# Patient Record
Sex: Female | Born: 1955
Health system: Southern US, Community
[De-identification: ages and names within clinical notes are randomized; demographics above are authoritative.]

## PROBLEM LIST (undated history)

## (undated) DIAGNOSIS — E119 Type 2 diabetes mellitus without complications: Secondary | ICD-10-CM

## (undated) DIAGNOSIS — I480 Paroxysmal atrial fibrillation: Secondary | ICD-10-CM

## (undated) DIAGNOSIS — I509 Heart failure, unspecified: Secondary | ICD-10-CM

## (undated) DIAGNOSIS — I3139 Other pericardial effusion (noninflammatory): Secondary | ICD-10-CM

## (undated) DIAGNOSIS — J189 Pneumonia, unspecified organism: Secondary | ICD-10-CM

## (undated) DIAGNOSIS — E785 Hyperlipidemia, unspecified: Secondary | ICD-10-CM

## (undated) DIAGNOSIS — I5030 Unspecified diastolic (congestive) heart failure: Secondary | ICD-10-CM

## (undated) DIAGNOSIS — N189 Chronic kidney disease, unspecified: Secondary | ICD-10-CM

## (undated) DIAGNOSIS — I10 Essential (primary) hypertension: Secondary | ICD-10-CM

## (undated) HISTORY — DX: Essential (primary) hypertension: I10

## (undated) HISTORY — DX: Paroxysmal atrial fibrillation: I48.0

## (undated) HISTORY — DX: Type 2 diabetes mellitus without complications: E11.9

## (undated) HISTORY — PX: HERNIA REPAIR: SHX51

## (undated) HISTORY — DX: Unspecified diastolic (congestive) heart failure: I50.30

## (undated) HISTORY — DX: Hyperlipidemia, unspecified: E78.5

## (undated) HISTORY — DX: Other pericardial effusion (noninflammatory): I31.39

---
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
## 2017-12-05 LAB — GLUCOSE, POCT (MANUAL RESULT ENTRY)
POC Glucose: 101 mg/dl — AB (ref 70–99)
POC Glucose: 77 mg/dl (ref 70–99)

## 2018-08-21 LAB — GLUCOSE, POCT (MANUAL RESULT ENTRY)
POC Glucose: 78 mg/dl (ref 70–99)
POC Glucose: 86 mg/dl (ref 70–99)

## 2020-04-01 ENCOUNTER — Other Ambulatory Visit (HOSPITAL_COMMUNITY)
Admission: RE | Admit: 2020-04-01 | Discharge: 2020-04-01 | Disposition: A | Discharge status: Home / Self Care | Payer: Self-pay | Source: Ambulatory Visit | Attending: Physician Assistant | Admitting: Physician Assistant

## 2020-04-01 ENCOUNTER — Other Ambulatory Visit: Payer: Self-pay

## 2020-04-01 ENCOUNTER — Encounter: Payer: Self-pay | Admitting: Physician Assistant

## 2020-04-01 ENCOUNTER — Ambulatory Visit: Payer: Self-pay | Admitting: Physician Assistant

## 2020-04-01 VITALS — BP 190/100 | HR 69 | Temp 97.5°F | Ht 62.0 in | Wt 227.0 lb

## 2020-04-01 DIAGNOSIS — I10 Essential (primary) hypertension: Secondary | ICD-10-CM | POA: Insufficient documentation

## 2020-04-01 DIAGNOSIS — Z9289 Personal history of other medical treatment: Secondary | ICD-10-CM

## 2020-04-01 DIAGNOSIS — Z8639 Personal history of other endocrine, nutritional and metabolic disease: Secondary | ICD-10-CM

## 2020-04-01 DIAGNOSIS — Z9889 Other specified postprocedural states: Secondary | ICD-10-CM

## 2020-04-01 DIAGNOSIS — Z8679 Personal history of other diseases of the circulatory system: Secondary | ICD-10-CM

## 2020-04-01 DIAGNOSIS — Z7689 Persons encountering health services in other specified circumstances: Secondary | ICD-10-CM

## 2020-04-01 LAB — LIPID PANEL
Cholesterol: 202 mg/dL — ABNORMAL HIGH (ref 0–200)
HDL: 58 mg/dL (ref >40)
LDL Cholesterol: 122 mg/dL — ABNORMAL HIGH (ref 0–99)
Total CHOL/HDL Ratio: 3.5 RATIO
Triglycerides: 112 mg/dL (ref <150)
VLDL: 22 mg/dL (ref 0–40)

## 2020-04-01 LAB — COMPREHENSIVE METABOLIC PANEL
ALT: 17 U/L (ref 0–44)
AST: 22 U/L (ref 15–41)
Albumin: 4.2 g/dL (ref 3.5–5.0)
Alkaline Phosphatase: 60 U/L (ref 38–126)
Anion gap: 11 (ref 5–15)
BUN: 10 mg/dL (ref 8–23)
CO2: 27 mmol/L (ref 22–32)
Calcium: 9.4 mg/dL (ref 8.9–10.3)
Chloride: 100 mmol/L (ref 98–111)
Creatinine, Ser: 1.16 mg/dL — ABNORMAL HIGH (ref 0.44–1.00)
GFR calc Af Amer: 58 mL/min — ABNORMAL LOW (ref >60)
GFR calc non Af Amer: 50 mL/min — ABNORMAL LOW (ref >60)
Glucose, Bld: 96 mg/dL (ref 70–99)
Potassium: 3.5 mmol/L (ref 3.5–5.1)
Sodium: 138 mmol/L (ref 135–145)
Total Bilirubin: 1.4 mg/dL — ABNORMAL HIGH (ref 0.3–1.2)
Total Protein: 7.4 g/dL (ref 6.5–8.1)

## 2020-04-01 LAB — CBC
HCT: 42.7 % (ref 36.0–46.0)
Hemoglobin: 13.4 g/dL (ref 12.0–15.0)
MCH: 29.8 pg (ref 26.0–34.0)
MCHC: 31.4 g/dL (ref 30.0–36.0)
MCV: 94.9 fL (ref 80.0–100.0)
Platelets: 272 10*3/uL (ref 150–400)
RBC: 4.5 MIL/uL (ref 3.87–5.11)
RDW: 13.7 % (ref 11.5–15.5)
WBC: 4.9 10*3/uL (ref 4.0–10.5)
nRBC: 0 % (ref 0.0–0.2)

## 2020-04-01 LAB — HEMOGLOBIN A1C
Hgb A1c MFr Bld: 6.1 % — ABNORMAL HIGH (ref 4.8–5.6)
Mean Plasma Glucose: 128.37 mg/dL

## 2020-04-01 MED ORDER — LOSARTAN POTASSIUM 100 MG PO TABS
100.0000 mg | ORAL_TABLET | Freq: Every day | ORAL | 1 refills | Status: DC
Start: 2020-04-01 — End: 2020-04-23

## 2020-04-01 NOTE — Progress Notes (Signed)
BP (!) 190/100   Pulse 69   Temp (!) 97.5 F (36.4 C)   Ht 5\' 2"  (1.575 m)   Wt 227 lb (103 kg)   SpO2 93%   BMI 41.52 kg/m    Subjective:    Patient ID: Tammy Small, female    DOB: 12-15-1955, 64 y.o.   MRN: [id number]  HPI: Tammy Small is a 64 y.o. female presenting on 04/01/2020 for New Patient (Initial Visit) (pt moved from 04/03/2020 in March 2021. pt has been out of her medications since March 2021)   HPI   Pt had a negative covid 19 screening questionnaire.    Pt is a very pleasant 63yoF who presents to office today to establish care.    Pt moved here in March from [place] and has been out of meds since then.  She was on meds for htn, dm and cholesterol.  She has been checking her sugar at home and it's been running less than 100.   Pt says she had to get "shocked" in 2019 to get her heart back in the correct rhythm.  She thinks it was for a.fib and says it stayed normal after that.   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  She worked for 22yr with [employer] prior to going to 26yr office.   She had colonoscopy in February (her insurance ended in march).   Last mammogram was january or february this year.  Last pap jan or feb also.    Pt got her covid vaccination (J&J)  Pt is feeling well today and denies any CP, sob, vision changes, HA.       Relevant past medical, surgical, family and social history reviewed and updated as indicated. Interim medical history since our last visit reviewed. Allergies and medications reviewed and updated.  CURRENT MEDS: Asa 81mg  Fish oil Vit d Vit b   Review of Systems  Per HPI unless specifically indicated above     Objective:    BP (!) 190/100   Pulse 69   Temp (!) 97.5 F (36.4 C)   Ht 5\' 2"  (1.575 m)   Wt 227 lb (103 kg)   SpO2 93%   BMI 41.52 kg/m   Wt Readings from Last 3 Encounters:  04/01/20 227 lb (103 kg)    Physical Exam Vitals reviewed.   Constitutional:      General: She is not in acute distress.    Appearance: She is well-developed. She is obese. She is not ill-appearing.  HENT:     Head: Normocephalic and atraumatic.  Cardiovascular:     Rate and Rhythm: Normal rate and regular rhythm.     Heart sounds: Murmur present.  Pulmonary:     Effort: Pulmonary effort is normal.     Breath sounds: Normal breath sounds.  Abdominal:     General: Bowel sounds are normal.     Palpations: Abdomen is soft. There is no mass.     Tenderness: There is no abdominal tenderness.  Musculoskeletal:     Cervical back: Neck supple.     Right lower leg: No edema.     Left lower leg: No edema.  Lymphadenopathy:     Cervical: No cervical adenopathy.  Skin:    General: Skin is warm and dry.  Neurological:     Mental Status: She is alert and oriented to person, place, and time.  Motor: No weakness or tremor.     Gait: Gait is intact. Gait normal.  Psychiatric:        Attention and Perception: Attention normal.        Mood and Affect: Mood normal.        Speech: Speech normal.        Behavior: Behavior normal. Behavior is cooperative.     No results found for this or any previous visit.    Assessment & Plan:    Encounter Diagnoses  Name Primary?  . Encounter to establish care Yes  . Essential hypertension   . History of diabetes mellitus   . History of hyperlipidemia   . History of cardioversion   . History of atrial fibrillation   . Morbid obesity ([place])      -update labs -recored request sent for most recent PAP and mammogram -She will bring name of GI doctor to next appt to get colonocsopy results -record request sent for echo and discharge summary from when she had cardioversion -pt to get on bp meds today- losartan (she doesn't remember what she was on in the past) -will await lab results before prescribing dm or lipid medication -pt to follow up in 3 or 4 week (before she goes on vacation).  Pt to contact  office sooner prn

## 2020-04-09 ENCOUNTER — Encounter: Payer: Self-pay | Admitting: Physician Assistant

## 2020-04-23 ENCOUNTER — Ambulatory Visit: Payer: Medicaid Other | Admitting: Physician Assistant

## 2020-04-23 ENCOUNTER — Other Ambulatory Visit: Payer: Self-pay

## 2020-04-23 ENCOUNTER — Encounter: Payer: Self-pay | Admitting: Physician Assistant

## 2020-04-23 VITALS — BP 190/95 | HR 56 | Temp 97.7°F

## 2020-04-23 DIAGNOSIS — N289 Disorder of kidney and ureter, unspecified: Secondary | ICD-10-CM

## 2020-04-23 DIAGNOSIS — I509 Heart failure, unspecified: Secondary | ICD-10-CM

## 2020-04-23 DIAGNOSIS — N189 Chronic kidney disease, unspecified: Secondary | ICD-10-CM

## 2020-04-23 DIAGNOSIS — E785 Hyperlipidemia, unspecified: Secondary | ICD-10-CM

## 2020-04-23 DIAGNOSIS — I38 Endocarditis, valve unspecified: Secondary | ICD-10-CM

## 2020-04-23 DIAGNOSIS — R7303 Prediabetes: Secondary | ICD-10-CM

## 2020-04-23 DIAGNOSIS — I872 Venous insufficiency (chronic) (peripheral): Secondary | ICD-10-CM

## 2020-04-23 DIAGNOSIS — R011 Cardiac murmur, unspecified: Secondary | ICD-10-CM

## 2020-04-23 DIAGNOSIS — Z8679 Personal history of other diseases of the circulatory system: Secondary | ICD-10-CM

## 2020-04-23 DIAGNOSIS — I10 Essential (primary) hypertension: Secondary | ICD-10-CM

## 2020-04-23 MED ORDER — CLONIDINE HCL 0.1 MG PO TABS
0.1000 mg | ORAL_TABLET | Freq: Once | ORAL | Status: AC
Start: 2020-04-23 — End: 2020-04-23
  Administered 2020-04-23: 0.1 mg via ORAL

## 2020-04-23 MED ORDER — AMLODIPINE BESYLATE 10 MG PO TABS
10.0000 mg | ORAL_TABLET | Freq: Every day | ORAL | 0 refills | Status: DC
Start: 2020-04-23 — End: 2020-04-23

## 2020-04-23 MED ORDER — LOSARTAN POTASSIUM 100 MG PO TABS: 100.0000 mg | ORAL_TABLET | Freq: Every day | ORAL | 0 refills | Status: DC

## 2020-04-23 MED ORDER — METOPROLOL TARTRATE 100 MG PO TABS
100.0000 mg | ORAL_TABLET | Freq: Two times a day (BID) | ORAL | 0 refills | Status: DC
Start: 2020-04-23 — End: 2020-04-23

## 2020-04-23 MED ORDER — METOPROLOL TARTRATE 50 MG PO TABS: 50.0000 mg | ORAL_TABLET | Freq: Two times a day (BID) | ORAL | 1 refills | Status: DC

## 2020-04-23 MED ORDER — CLONIDINE HCL 0.1 MG PO TABS
0.1000 mg | ORAL_TABLET | Freq: Once | ORAL | Status: AC
  Administered 2020-04-23: 0.1 mg via ORAL

## 2020-04-23 MED ORDER — LOSARTAN POTASSIUM 100 MG PO TABS: 100.0000 mg | ORAL_TABLET | Freq: Every day | ORAL | 1 refills | Status: DC

## 2020-04-23 MED ORDER — AMLODIPINE BESYLATE 10 MG PO TABS: 10.0000 mg | ORAL_TABLET | Freq: Every day | ORAL | 1 refills | Status: DC

## 2020-04-23 MED ORDER — ATORVASTATIN CALCIUM 20 MG PO TABS
20.0000 mg | ORAL_TABLET | Freq: Every day | ORAL | 0 refills | Status: DC
Start: 2020-04-23 — End: 2020-07-06

## 2020-04-23 MED ORDER — METOPROLOL TARTRATE 50 MG PO TABS
50.0000 mg | ORAL_TABLET | Freq: Two times a day (BID) | ORAL | 0 refills | Status: DC
Start: 2020-04-23 — End: 2020-04-23

## 2020-04-23 NOTE — Progress Notes (Signed)
BP (!) 202/112   Pulse (!) 56   Temp 97.7 F (36.5 C)   SpO2 93%    Subjective:    Patient ID: Tammy Small, female    DOB: 1956/05/19, 64 y.o.   MRN: [id number]  HPI: Tammy Small is a 64 y.o. female presenting on 04/23/2020 for No chief complaint on file.   HPI  Pt had a negative covid 19 screening questionnaire.    Pt is a pleasant 63yoF who returns to clinic today for bp recheck and to review labs.  She is Feeling well and is having No CP, HA or vision changes today or since her new patient appointment on 04/01/20    Gave   Clonidine 0.1mg   8:09am Gave 2nd dose- clonidine 0.1mg   8:26am 3rd dose clonidine 0.1 mg 8:50am  Records from when pt had elective cardioversion in 2019 arrived and reviewed.  It appears that she was hospitalized twice in 2019- In January it appears that cardioversion was not done due to atrial thrombus but pt returned to hospital in October and underwent cardioversion which was successful.  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 Interim medical history since our last visit reviewed. Allergies and medications reviewed and updated.   Current Outpatient Medications:  .  aspirin 81 MG chewable tablet, Chew 81 mg by mouth daily., Disp: , Rfl:  .  Cholecalciferol (VITAMIN D3 PO), Take 1 tablet by mouth daily., Disp: , Rfl:  .  losartan (COZAAR) 100 MG tablet, Take 1 tablet (100 mg total) by mouth daily., Disp: 30 tablet, Rfl: 1 .  Omega-3 Fatty Acids (OMEGA 3 PO), Take 1 capsule by mouth daily., Disp: , Rfl:  .  Riboflavin (VITAMIN B-2 PO), Take 1 tablet by mouth daily., Disp: , Rfl:      Review of Systems  Per HPI unless specifically indicated above     Objective:    BP (!) 202/112   Pulse (!) 56   Temp 97.7 F (36.5  C)   SpO2 93%   Wt Readings from Last 3 Encounters:  04/01/20 227 lb (103 kg)     Vitals:   04/23/20 0802 04/23/20 0825 04/23/20 0847 04/23/20 0922  BP: (!) 202/112 (!) 222/110 (!) 197/104 (!) 190/95  Pulse: (!) 56     Temp: 97.7 F (36.5 C)     SpO2: 93%        Physical Exam Vitals reviewed.  Constitutional:      Appearance: She is well-developed.  HENT:     Head: Normocephalic and atraumatic.  Cardiovascular:     Rate and Rhythm: Normal rate and regular rhythm.     Pulses: Normal pulses.     Heart sounds: Murmur heard.      Comments: Rate between 55-65 Pulmonary:     Effort: Pulmonary effort is normal.     Breath sounds: Normal breath sounds.  Abdominal:     General: Bowel sounds are normal.     Palpations: Abdomen is soft. There is no mass.     Tenderness: There is no abdominal tenderness.  Musculoskeletal:     Cervical back: Neck supple.  Lymphadenopathy:     Cervical: No cervical adenopathy.  Skin:    General: Skin is warm and dry.  Neurological:     Mental Status: She is alert  and oriented to person, place, and time.     Cranial Nerves: No facial asymmetry.     Motor: No weakness or tremor.     Gait: Gait is intact.  Psychiatric:        Attention and Perception: Attention normal.        Mood and Affect: Affect is tearful.        Speech: Speech normal.        Behavior: Behavior normal. Behavior is cooperative.     Comments: Pleasant.  Tearful at times (upset over her BP)      murmur   Results for orders placed or performed during the hospital encounter of 04/01/20  CBC  Result Value Ref Range   WBC 4.9 4.0 - 10.5 K/uL   RBC 4.50 3.87 - 5.11 MIL/uL   Hemoglobin 13.4 12.0 - 15.0 g/dL   HCT 32.6 36 - 46 %   MCV 94.9 80.0 - 100.0 fL   MCH 29.8 26.0 - 34.0 pg   MCHC 31.4 30.0 - 36.0 g/dL   RDW 71.2 45.8 - 09.9 %   Platelets 272 150 - 400 K/uL   nRBC 0.0 0.0 - 0.2 %  Lipid panel  Result Value Ref Range   Cholesterol 202 (H) 0 - 200 mg/dL    Triglycerides 833 <825 mg/dL   HDL 58 >05 mg/dL   Total CHOL/HDL Ratio 3.5 RATIO   VLDL 22 0 - 40 mg/dL   LDL Cholesterol 397 (H) 0 - 99 mg/dL  Hemoglobin Q7H  Result Value Ref Range   Hgb A1c MFr Bld 6.1 (H) 4.8 - 5.6 %   Mean Plasma Glucose 128.37 mg/dL  Comprehensive metabolic panel  Result Value Ref Range   Sodium 138 135 - 145 mmol/L   Potassium 3.5 3.5 - 5.1 mmol/L   Chloride 100 98 - 111 mmol/L   CO2 27 22 - 32 mmol/L   Glucose, Bld 96 70 - 99 mg/dL   BUN 10 8 - 23 mg/dL   Creatinine, Ser 4.19 (H) 0.44 - 1.00 mg/dL   Calcium 9.4 8.9 - 37.9 mg/dL   Total Protein 7.4 6.5 - 8.1 g/dL   Albumin 4.2 3.5 - 5.0 g/dL   AST 22 15 - 41 U/L   ALT 17 0 - 44 U/L   Alkaline Phosphatase 60 38 - 126 U/L   Total Bilirubin 1.4 (H) 0.3 - 1.2 mg/dL   GFR calc non Af Amer 50 (L) >60 mL/min   GFR calc Af Amer 58 (L) >60 mL/min   Anion gap 11 5 - 15      Assessment & Plan:   Encounter Diagnoses  Name Primary?  . HTN (hypertension), malignant Yes  . Hyperlipidemia, unspecified hyperlipidemia type   . Chronic heart failure, unspecified heart failure type (HCC)   . Heart murmur   . Heart valve disease   . Chronic kidney disease, unspecified CKD stage   . Prediabetes   . Morbid obesity ([facility])   . Venous insufficiency   . Impaired renal function   . History of carotid artery disease       HTN -Reviewed labs with pt -discussed going to ER for elevated blood pressure.  While pt says she would go she would prefer to get BP controlled without it -pt was given 3 doses of clonidine 0.1mg  in office with some improvement in the BP.  Pt asked with each dose if she was having any cp, HA, vision changes or other symptoms ans  she always said no.  Pt was monitored in the office for 1 1/2 hours. -pt to continue losartan.  Add metoprolol (low dose due to pulse) and amlodipine for blood pressure.  Pt will start the additional medications today -pt is given automatic BP machine so she can check her BP  at home.   -pt to follow up in office next week for recheck.  Pt is again reminded that if she develops HA, CP, vision changes, she should go to ER  CKD -will monitor renal function.  Pt counseled to avoid nsaids.  She is on losartan  PREDIABETES -Counseled on prediabetes.  Pt counseled to continue to watch diet and exercise regularly (but no exercise until BP improved)  DYSLIPIDEMIA Restart statin.  Will rx atorvastatin as it is available through [facility] at no cost to the patient  HCM:    Mammogram, PAP, colonoscopy, covid vaccination- all up-to-date  HF/valvular disease  -Carotid US -echo -Cardiology referral -Gave CAFA/application for [facility] financial assistance.

## 2020-04-29 ENCOUNTER — Encounter: Payer: Self-pay | Admitting: Physician Assistant

## 2020-04-29 ENCOUNTER — Other Ambulatory Visit: Payer: Self-pay

## 2020-04-29 ENCOUNTER — Ambulatory Visit: Payer: Medicaid Other | Admitting: Physician Assistant

## 2020-04-29 VITALS — BP 150/84 | HR 56 | Temp 97.7°F | Ht 62.0 in | Wt 228.8 lb

## 2020-04-29 DIAGNOSIS — I10 Essential (primary) hypertension: Secondary | ICD-10-CM

## 2020-04-29 MED ORDER — HYDROCHLOROTHIAZIDE 12.5 MG PO CAPS: 12.5000 mg | ORAL_CAPSULE | Freq: Every day | ORAL | 1 refills | Status: DC

## 2020-04-29 NOTE — Progress Notes (Signed)
BP (!) 150/84   Pulse (!) 56   Temp 97.7 F (36.5 C)   Ht 5\' 2"  (1.575 m)   Wt 228 lb 12.8 oz (103.8 kg)   SpO2 92%   BMI 41.85 kg/m    Subjective:    Patient ID: Tammy Small, female    DOB: [birth year], 64 y.o.   MRN: [id number]  HPI: Tammy Small is a 64 y.o. female presenting on 04/29/2020 for Hypertension   HPI  Pt had a negative covid 19 screening questionnaire.    Pt is 76yoF who presents for follow up of HTN.  She was given an automatic machine last week and She has been monitoring her bp at home.   Her readings are mostly in the 150s.  She says she is feeling very well today.      Relevant past medical, surgical, family and social history reviewed and updated as indicated. Interim medical history since our last visit reviewed. Allergies and medications reviewed and updated.   Current Outpatient Medications:  .  amLODipine (NORVASC) 10 MG tablet, Take 1 tablet (10 mg total) by mouth daily., Disp: 30 tablet, Rfl: 1 .  aspirin 81 MG chewable tablet, Chew 81 mg by mouth daily., Disp: , Rfl:  .  atorvastatin (LIPITOR) 20 MG tablet, Take 1 tablet (20 mg total) by mouth daily., Disp: 90 tablet, Rfl: 0 .  Cholecalciferol (VITAMIN D3 PO), Take 1 tablet by mouth daily., Disp: , Rfl:  .  losartan (COZAAR) 100 MG tablet, Take 1 tablet (100 mg total) by mouth daily., Disp: 30 tablet, Rfl: 1 .  metoprolol tartrate (LOPRESSOR) 50 MG tablet, Take 1 tablet (50 mg total) by mouth 2 (two) times daily., Disp: 60 tablet, Rfl: 1 .  Omega-3 Fatty Acids (OMEGA 3 PO), Take 1 capsule by mouth daily., Disp: , Rfl:  .  Riboflavin (VITAMIN B-2 PO), Take 1 tablet by mouth daily., Disp: , Rfl:    Review of Systems  Per HPI unless specifically indicated above     Objective:    BP (!) 150/84   Pulse (!) 56   Temp 97.7 F (36.5 C)   Ht 5\' 2"  (1.575 m)   Wt 228 lb 12.8 oz (103.8 kg)   SpO2 92%   BMI 41.85 kg/m   Wt Readings from Last 3 Encounters:  04/29/20 228 lb 12.8 oz (103.8 kg)   04/01/20 227 lb (103 kg)    Physical Exam Vitals reviewed.  Constitutional:      General: She is not in acute distress.    Appearance: She is well-developed. She is not toxic-appearing.  HENT:     Head: Normocephalic and atraumatic.  Cardiovascular:     Rate and Rhythm: Normal rate and regular rhythm.  Pulmonary:     Effort: Pulmonary effort is normal.     Breath sounds: Normal breath sounds.  Abdominal:     General: Bowel sounds are normal.     Palpations: Abdomen is soft. There is no mass.     Tenderness: There is no abdominal tenderness.  Musculoskeletal:     Cervical back: Neck supple.  Lymphadenopathy:     Cervical: No cervical adenopathy.  Skin:    General: Skin is warm and dry.  Neurological:     Mental Status: She is alert and oriented to person, place, and time.  Psychiatric:        Behavior: Behavior normal.            Assessment & Plan:  Encounter Diagnosis  Name Primary?  . HTN (hypertension), malignant Yes     -Add hctz 12.5 daily and continue current meds -pt is Leaving tomorrow for 2 weeks out of town.   -Echo and carotid US scheduled for 7/15 -pt to follow up in office in 4 weeks.  She is to contact office sooner prn

## 2020-04-30 ENCOUNTER — Encounter: Payer: Self-pay | Admitting: Physician Assistant

## 2020-05-08 ENCOUNTER — Other Ambulatory Visit (HOSPITAL_COMMUNITY): Payer: Medicaid Other

## 2020-05-21 ENCOUNTER — Other Ambulatory Visit: Payer: Self-pay

## 2020-05-21 ENCOUNTER — Ambulatory Visit (HOSPITAL_COMMUNITY)
Admission: RE | Admit: 2020-05-21 | Discharge: 2020-05-21 | Disposition: A | Discharge status: Home / Self Care | Payer: Self-pay | Source: Ambulatory Visit | Attending: Physician Assistant | Admitting: Physician Assistant

## 2020-05-21 ENCOUNTER — Other Ambulatory Visit (HOSPITAL_COMMUNITY)
Admission: RE | Admit: 2020-05-21 | Discharge: 2020-05-21 | Disposition: A | Discharge status: Home / Self Care | Payer: Medicaid Other | Source: Ambulatory Visit | Attending: Physician Assistant | Admitting: Physician Assistant

## 2020-05-21 ENCOUNTER — Ambulatory Visit: Payer: Self-pay | Admitting: Cardiovascular Disease

## 2020-05-21 DIAGNOSIS — R011 Cardiac murmur, unspecified: Secondary | ICD-10-CM | POA: Insufficient documentation

## 2020-05-21 DIAGNOSIS — I10 Essential (primary) hypertension: Secondary | ICD-10-CM

## 2020-05-21 DIAGNOSIS — I509 Heart failure, unspecified: Secondary | ICD-10-CM | POA: Insufficient documentation

## 2020-05-21 DIAGNOSIS — I38 Endocarditis, valve unspecified: Secondary | ICD-10-CM | POA: Insufficient documentation

## 2020-05-21 LAB — BASIC METABOLIC PANEL
Anion gap: 9 (ref 5–15)
BUN: 16 mg/dL (ref 8–23)
CO2: 27 mmol/L (ref 22–32)
Calcium: 9.7 mg/dL (ref 8.9–10.3)
Chloride: 104 mmol/L (ref 98–111)
Creatinine, Ser: 1.21 mg/dL — ABNORMAL HIGH (ref 0.44–1.00)
GFR calc Af Amer: 56 mL/min — ABNORMAL LOW (ref >60)
GFR calc non Af Amer: 48 mL/min — ABNORMAL LOW (ref >60)
Glucose, Bld: 106 mg/dL — ABNORMAL HIGH (ref 70–99)
Potassium: 4.1 mmol/L (ref 3.5–5.1)
Sodium: 140 mmol/L (ref 135–145)

## 2020-05-21 NOTE — Progress Notes (Signed)
*  PRELIMINARY RESULTS* Echocardiogram 2D Echocardiogram has been performed.  Stacey Drain 05/21/2020, 11:03 AM

## 2020-05-27 ENCOUNTER — Encounter: Payer: Self-pay | Admitting: Physician Assistant

## 2020-05-27 ENCOUNTER — Ambulatory Visit: Payer: Medicaid Other | Admitting: Physician Assistant

## 2020-05-27 ENCOUNTER — Other Ambulatory Visit: Payer: Self-pay

## 2020-05-27 VITALS — BP 120/70 | HR 50 | Temp 97.3°F | Ht 62.0 in | Wt 231.4 lb

## 2020-05-27 DIAGNOSIS — I38 Endocarditis, valve unspecified: Secondary | ICD-10-CM

## 2020-05-27 DIAGNOSIS — R011 Cardiac murmur, unspecified: Secondary | ICD-10-CM

## 2020-05-27 DIAGNOSIS — E785 Hyperlipidemia, unspecified: Secondary | ICD-10-CM

## 2020-05-27 DIAGNOSIS — N189 Chronic kidney disease, unspecified: Secondary | ICD-10-CM

## 2020-05-27 DIAGNOSIS — I10 Essential (primary) hypertension: Secondary | ICD-10-CM

## 2020-05-27 DIAGNOSIS — I509 Heart failure, unspecified: Secondary | ICD-10-CM

## 2020-05-27 DIAGNOSIS — R7303 Prediabetes: Secondary | ICD-10-CM

## 2020-05-27 MED ORDER — HYDROCHLOROTHIAZIDE 12.5 MG PO CAPS: 12.5000 mg | ORAL_CAPSULE | Freq: Every day | ORAL | 1 refills | Status: DC

## 2020-05-27 NOTE — Progress Notes (Signed)
BP 120/70   Pulse (!) 50   Temp (!) 97.3 F (36.3 C)   Ht 5\' 2"  (1.575 m)   Wt 231 lb 6.4 oz (105 kg)   SpO2 92%   BMI 42.32 kg/m    Subjective:    Patient ID: , female    DOB: [birth year], 64 y.o.   MRN: [id number]  HPI: Tammy Small is a 64 y.o. female presenting on 05/27/2020 for Hypertension   HPI   Pt had a negative covid 19 screening questionnaire.    Pt is a very pleasant 63yoF with HTN, dyslipidemia, HF, valvular heart disease, CKD, prediabetes, venous insufficiency who is in to office today to follow up on elevated BP.    Pt Has appt with cardiology in September for HF and valvular disease.  Pt had Echo done since her last office visit.   It revealed Some new pericardial effusion  Pt says she is feeling very well today and denies CP, SOB, abdominal pain.  She is enjoying being able to check her BP at home.      Relevant past medical, surgical, family and social history reviewed and updated as indicated. Interim medical history since our last visit reviewed. Allergies and medications reviewed and updated.   Current Outpatient Medications:  .  amLODipine (NORVASC) 10 MG tablet, Take 1 tablet (10 mg total) by mouth daily., Disp: 30 tablet, Rfl: 1 .  aspirin 81 MG chewable tablet, Chew 81 mg by mouth daily., Disp: , Rfl:  .  atorvastatin (LIPITOR) 20 MG tablet, Take 1 tablet (20 mg total) by mouth daily., Disp: 90 tablet, Rfl: 0 .  Cholecalciferol (VITAMIN D3 PO), Take 1 tablet by mouth daily., Disp: , Rfl:  .  losartan (COZAAR) 100 MG tablet, Take 1 tablet (100 mg total) by mouth daily., Disp: 30 tablet, Rfl: 1 .  metoprolol tartrate (LOPRESSOR) 50 MG tablet, Take 1 tablet (50 mg total) by mouth 2 (two) times daily., Disp: 60 tablet, Rfl: 1 .  Omega-3 Fatty Acids (OMEGA 3 PO), Take 1 capsule by mouth daily., Disp: , Rfl:  .  Riboflavin (VITAMIN B-2 PO), Take 1 tablet by mouth daily., Disp: , Rfl:  .  hydrochlorothiazide (MICROZIDE) 12.5 MG capsule,  Take 1 capsule (12.5 mg total) by mouth daily., Disp: 90 capsule, Rfl: 1   Review of Systems  Per HPI unless specifically indicated above     Objective:    BP 120/70   Pulse (!) 50   Temp (!) 97.3 F (36.3 C)   Ht 5\' 2"  (1.575 m)   Wt 231 lb 6.4 oz (105 kg)   SpO2 92%   BMI 42.32 kg/m   Wt Readings from Last 3 Encounters:  05/27/20 231 lb 6.4 oz (105 kg)  04/29/20 228 lb 12.8 oz (103.8 kg)  04/01/20 227 lb (103 kg)    Physical Exam Vitals reviewed.  Constitutional:      General: She is not in acute distress.    Appearance: She is well-developed. She is not ill-appearing.  HENT:     Head: Normocephalic and atraumatic.  Cardiovascular:     Rate and Rhythm: Normal rate and regular rhythm.  Pulmonary:     Effort: Pulmonary effort is normal.     Breath sounds: Normal breath sounds.  Abdominal:     General: Bowel sounds are normal.     Palpations: Abdomen is soft. There is no mass.     Tenderness: There is no abdominal tenderness.  Musculoskeletal:  Cervical back: Neck supple.     Right lower leg: No edema.     Left lower leg: No edema.  Lymphadenopathy:     Cervical: No cervical adenopathy.  Skin:    General: Skin is warm and dry.  Neurological:     Mental Status: She is alert and oriented to person, place, and time.  Psychiatric:        Behavior: Behavior normal.     Results for orders placed or performed during the hospital encounter of 05/21/20  Basic metabolic panel  Result Value Ref Range   Sodium 140 135 - 145 mmol/L   Potassium 4.1 3.5 - 5.1 mmol/L   Chloride 104 98 - 111 mmol/L   CO2 27 22 - 32 mmol/L   Glucose, Bld 106 (H) 70 - 99 mg/dL   BUN 16 8 - 23 mg/dL   Creatinine, Ser 7.41 (H) 0.44 - 1.00 mg/dL   Calcium 9.7 8.9 - 28.7 mg/dL   GFR calc non Af Amer 48 (L) >60 mL/min   GFR calc Af Amer 56 (L) >60 mL/min   Anion gap 9 5 - 15      Assessment & Plan:    Encounter Diagnoses  Name Primary?  . HTN (hypertension), malignant Yes  .  Hyperlipidemia, unspecified hyperlipidemia type   . Chronic heart failure, unspecified heart failure type (HCC)   . Heart valve disease   . Heart murmur   . Chronic kidney disease, unspecified CKD stage   . Morbid obesity (HCC)   . Prediabetes       -reviewed labs with pt.  Will need to continue to monitor renal function with adjustments in medications for HTN and HF -pt has been scheduled to see cardiology, appt in September. -no medication changes today -pt to follow up in 2 months.  She is to contact office sooner prn

## 2020-06-02 ENCOUNTER — Other Ambulatory Visit: Payer: Self-pay

## 2020-06-02 ENCOUNTER — Ambulatory Visit (HOSPITAL_COMMUNITY)
Admission: RE | Admit: 2020-06-02 | Discharge: 2020-06-02 | Disposition: A | Discharge status: Home / Self Care | Payer: Self-pay | Source: Ambulatory Visit | Attending: Physician Assistant | Admitting: Physician Assistant

## 2020-06-02 DIAGNOSIS — I38 Endocarditis, valve unspecified: Secondary | ICD-10-CM | POA: Insufficient documentation

## 2020-06-25 ENCOUNTER — Other Ambulatory Visit: Payer: Self-pay

## 2020-06-25 ENCOUNTER — Ambulatory Visit: Payer: Medicaid Other | Admitting: Physician Assistant

## 2020-06-25 ENCOUNTER — Encounter: Payer: Self-pay | Admitting: Physician Assistant

## 2020-06-25 VITALS — BP 116/70 | HR 49 | Temp 97.5°F | Ht 62.0 in | Wt 231.8 lb

## 2020-06-25 DIAGNOSIS — N3090 Cystitis, unspecified without hematuria: Secondary | ICD-10-CM

## 2020-06-25 DIAGNOSIS — R109 Unspecified abdominal pain: Secondary | ICD-10-CM

## 2020-06-25 DIAGNOSIS — S39012A Strain of muscle, fascia and tendon of lower back, initial encounter: Secondary | ICD-10-CM

## 2020-06-25 LAB — POCT URINALYSIS DIPSTICK
Glucose, UA: NEGATIVE
Nitrite, UA: NEGATIVE
Protein, UA: POSITIVE — AB
Spec Grav, UA: 1.02 (ref 1.010–1.025)
Urobilinogen, UA: 1 E.U./dL
pH, UA: 5.5 (ref 5.0–8.0)

## 2020-06-25 MED ORDER — CEPHALEXIN 500 MG PO CAPS
500.0000 mg | ORAL_CAPSULE | Freq: Four times a day (QID) | ORAL | 0 refills | Status: DC
Start: 2020-06-25 — End: 2020-07-10

## 2020-06-25 MED ORDER — CYCLOBENZAPRINE HCL 10 MG PO TABS
10.0000 mg | ORAL_TABLET | Freq: Three times a day (TID) | ORAL | 0 refills | Status: DC | PRN
Start: 2020-06-25 — End: 2020-07-10

## 2020-06-25 NOTE — Patient Instructions (Signed)
Lumbar Strain A lumbar strain, which is sometimes called a low-back strain, is a stretch or tear in a muscle or the strong cords of tissue that attach muscle to bone (tendons) in the lower back (lumbar spine). This type of injury occurs when muscles or tendons are torn or are stretched beyond their limits. Lumbar strains can range from mild to severe. Mild strains may involve stretching a muscle or tendon without tearing it. These may heal in 1-2 weeks. More severe strains involve tearing of muscle fibers or tendons. These will cause more pain and may take 6-8 weeks to heal. What are the causes? This condition may be caused by:  Trauma, such as a fall or a hit to the body.  Twisting or overstretching the back. This may result from doing activities that need a lot of energy, such as lifting heavy objects. What increases the risk? This injury is more common in:  Athletes.  People with obesity.  People who do repeated lifting, bending, or other movements that involve their back. What are the signs or symptoms? Symptoms of this condition may include:  Sharp or dull pain in the lower back that does not go away. The pain may extend to the buttocks.  Stiffness or limited range of motion.  Sudden muscle tightening (spasms). How is this diagnosed? This condition may be diagnosed based on:  Your symptoms.  Your medical history.  A physical exam.  Imaging tests, such as: ? X-rays. ? MRI. How is this treated? Treatment for this condition may include:  Rest.  Applying heat and cold to the affected area.  Over-the-counter medicines to help relieve pain and inflammation, such as NSAIDs.  Prescription pain medicine and muscle relaxants may be needed for a short time.  Physical therapy. Follow these instructions at home: Managing pain, stiffness, and swelling      If directed, put ice on the injured area during the first 24 hours after your injury. ? Put ice in a plastic  bag. ? Place a towel between your skin and the bag. ? Leave the ice on for 20 minutes, 2-3 times a day.  If directed, apply heat to the affected area as often as told by your health care provider. Use the heat source that your health care provider recommends, such as a moist heat pack or a heating pad. ? Place a towel between your skin and the heat source. ? Leave the heat on for 20-30 minutes. ? Remove the heat if your skin turns bright red. This is especially important if you are unable to feel pain, heat, or cold. You may have a greater risk of getting burned. Activity  Rest and return to your normal activities as told by your health care provider. Ask your health care provider what activities are safe for you.  Do exercises as told by your health care provider. Medicines  Take over-the-counter and prescription medicines only as told by your health care provider.  Ask your health care provider if the medicine prescribed to you: ? Requires you to avoid driving or using heavy machinery. ? Can cause constipation. You may need to take these actions to prevent or treat constipation:  Drink enough fluid to keep your urine pale yellow.  Take over-the-counter or prescription medicines.  Eat foods that are high in fiber, such as beans, whole grains, and fresh fruits and vegetables.  Limit foods that are high in fat and processed sugars, such as fried or sweet foods. Injury prevention To prevent   a future low-back injury:  Always warm up properly before physical activity or sports.  Cool down and stretch after being active.  Use correct form when playing sports and lifting heavy objects. Bend your knees before you lift heavy objects.  Use good posture when sitting and standing.  Stay physically fit and keep a healthy weight. ? Do at least 150 minutes of moderate-intensity exercise each week, such as brisk walking or water aerobics. ? Do strength exercises at least 2 times each  week.  General instructions  Do not use any products that contain nicotine or tobacco, such as cigarettes, e-cigarettes, and chewing tobacco. If you need help quitting, ask your health care provider.  Keep all follow-up visits as told by your health care provider. This is important. Contact a health care provider if:  Your back pain does not improve after 6 weeks of treatment.  Your symptoms get worse. Get help right away if:  Your back pain is severe.  You are unable to stand or walk.  You develop pain in your legs.  You develop weakness in your buttocks or legs.  You have difficulty controlling when you urinate or when you have a bowel movement. ? You have frequent, painful, or bloody urination. ? You have a temperature over 101.0F (38.3C) Summary  A lumbar strain, which is sometimes called a low-back strain, is a stretch or tear in a muscle or the strong cords of tissue that attach muscle to bone (tendons) in the lower back (lumbar spine).  This type of injury occurs when muscles or tendons are torn or are stretched beyond their limits.  Rest and return to your normal activities as told by your health care provider. If directed, apply heat and ice to the affected area as often as told by your health care provider.  Take over-the-counter and prescription medicines only as told by your health care provider.  Contact a health care provider if you have new or worsening symptoms. This information is not intended to replace advice given to you by your health care provider. Make sure you discuss any questions you have with your health care provider. Document Revised: 08/23/2018 Document Reviewed: 08/23/2018 [employer] Patient Education  2020 [employer]  

## 2020-06-25 NOTE — Progress Notes (Signed)
BP 116/70   Pulse (!) 49   Temp (!) 97.5 F (36.4 C)   Ht 5\' 2"  (1.575 m)   Wt 231 lb 12.8 oz (105.1 kg)   SpO2 93%   BMI 42.40 kg/m    Subjective:    Patient ID: , female    DOB: [birth year], 64 y.o.   MRN: [id number]  HPI: Tammy Small is a 64 y.o. female presenting on 06/25/2020 for Back Pain (Low R back pain since Tuesday. pt denies injury)   HPI  Pt had a negative covid 19 screening questionnaire.    Chief Complaint  Patient presents with  . Back Pain    Low R back pain since Tuesday. pt denies injury     Pain started while rolling over in bed.  She has Not taken anything for the pain.  She says the Pain is in the lower back.  No radiation into LE, no numbness or weakness.    Relevant past medical, surgical, family and social history reviewed and updated as indicated. Interim medical history since our last visit reviewed. Allergies and medications reviewed and updated.   Current Outpatient Medications:  .  amLODipine (NORVASC) 10 MG tablet, Take 1 tablet (10 mg total) by mouth daily., Disp: 30 tablet, Rfl: 1 .  aspirin 81 MG chewable tablet, Chew 81 mg by mouth daily., Disp: , Rfl:  .  atorvastatin (LIPITOR) 20 MG tablet, Take 1 tablet (20 mg total) by mouth daily., Disp: 90 tablet, Rfl: 0 .  Cholecalciferol (VITAMIN D3 PO), Take 1 tablet by mouth daily., Disp: , Rfl:  .  hydrochlorothiazide (MICROZIDE) 12.5 MG capsule, Take 1 capsule (12.5 mg total) by mouth daily., Disp: 90 capsule, Rfl: 1 .  losartan (COZAAR) 100 MG tablet, Take 1 tablet (100 mg total) by mouth daily., Disp: 30 tablet, Rfl: 1 .  metoprolol tartrate (LOPRESSOR) 50 MG tablet, Take 1 tablet (50 mg total) by mouth 2 (two) times daily., Disp: 60 tablet, Rfl: 1 .  Omega-3 Fatty Acids (OMEGA 3 PO), Take 1 capsule by mouth daily., Disp: , Rfl:  .  Riboflavin (VITAMIN B-2 PO), Take 1 tablet by mouth daily., Disp: , Rfl:    Review of Systems  Per HPI unless specifically indicated  above     Objective:    BP 116/70   Pulse (!) 49   Temp (!) 97.5 F (36.4 C)   Ht 5\' 2"  (1.575 m)   Wt 231 lb 12.8 oz (105.1 kg)   SpO2 93%   BMI 42.40 kg/m   Wt Readings from Last 3 Encounters:  06/25/20 231 lb 12.8 oz (105.1 kg)  05/27/20 231 lb 6.4 oz (105 kg)  04/29/20 228 lb 12.8 oz (103.8 kg)    Physical Exam Vitals reviewed.  Constitutional:      Appearance: She is well-developed.  HENT:     Head: Normocephalic and atraumatic.  Cardiovascular:     Rate and Rhythm: Normal rate and regular rhythm.  Pulmonary:     Effort: Pulmonary effort is normal.     Breath sounds: Normal breath sounds.  Abdominal:     General: Bowel sounds are normal.     Palpations: Abdomen is soft. There is no mass.     Tenderness: There is no abdominal tenderness. There is right CVA tenderness. There is no left CVA tenderness, guarding or rebound.  Musculoskeletal:     Cervical back: Neck supple.     Thoracic back: Tenderness present. No bony tenderness.  Lumbar back: Tenderness present. No bony tenderness.       Back:  Lymphadenopathy:     Cervical: No cervical adenopathy.  Skin:    General: Skin is warm and dry.  Neurological:     Mental Status: She is alert and oriented to person, place, and time.  Psychiatric:        Behavior: Behavior normal.       Urinalysis    Component Value Date/Time   BILIRUBINUR MODERATE 06/25/2020 1335   PROTEINUR Positive (A) 06/25/2020 1335   UROBILINOGEN 1.0 06/25/2020 1335   NITRITE NEG 06/25/2020 1335   LEUKOCYTESUR Trace (A) 06/25/2020 1335         Assessment & Plan:     Encounter Diagnoses  Name Primary?  . Right flank pain Yes  . Back strain, initial encounter   . Cystitis       -Keflex for urine and  -Flexeril and APAP for back -Ice/heat to back -follow up as scheduled.  Contact office sooner prn worsening or new symtpoms

## 2020-07-06 ENCOUNTER — Other Ambulatory Visit: Payer: Self-pay | Admitting: Physician Assistant

## 2020-07-10 ENCOUNTER — Other Ambulatory Visit: Payer: Self-pay

## 2020-07-10 ENCOUNTER — Ambulatory Visit (INDEPENDENT_AMBULATORY_CARE_PROVIDER_SITE_OTHER): Payer: Self-pay | Admitting: Cardiology

## 2020-07-10 ENCOUNTER — Encounter: Payer: Self-pay | Admitting: Cardiology

## 2020-07-10 VITALS — BP 120/72 | HR 51 | Ht 62.0 in | Wt 225.0 lb

## 2020-07-10 DIAGNOSIS — I3139 Other pericardial effusion (noninflammatory): Secondary | ICD-10-CM

## 2020-07-10 DIAGNOSIS — I5189 Other ill-defined heart diseases: Secondary | ICD-10-CM

## 2020-07-10 DIAGNOSIS — Z79899 Other long term (current) drug therapy: Secondary | ICD-10-CM

## 2020-07-10 DIAGNOSIS — I10 Essential (primary) hypertension: Secondary | ICD-10-CM

## 2020-07-10 DIAGNOSIS — I313 Pericardial effusion (noninflammatory): Secondary | ICD-10-CM

## 2020-07-10 MED ORDER — FUROSEMIDE 20 MG PO TABS
20.0000 mg | ORAL_TABLET | Freq: Every day | ORAL | 3 refills | Status: DC
Start: 2020-07-10 — End: 2021-05-23

## 2020-07-10 MED ORDER — METOPROLOL TARTRATE 25 MG PO TABS: 25.0000 mg | ORAL_TABLET | Freq: Two times a day (BID) | ORAL | 3 refills | Status: DC

## 2020-07-10 NOTE — Patient Instructions (Signed)
Medication Instructions:  STOP HCTZ   START Lasix 20 mg daily   DECREASE Lopressor to 25 mg twice a day     *If you need a refill on your cardiac medications before your next appointment, please call your pharmacy*   Lab Work: BMET in 2 weeks ( 07/24/20)  If you have labs (blood work) drawn today and your tests are completely normal, you will receive your results only by:  MyChart Message (if you have MyChart) OR  A paper copy in the mail If you have any lab test that is abnormal or we need to change your treatment, we will call you to review the results.   Testing/Procedures: None today   Follow-Up: At [facility], you and your health needs are our priority.  As part of our continuing mission to provide you with exceptional heart care, we have created designated Provider Care Teams.  These Care Teams include your primary Cardiologist (physician) and Advanced Practice Providers (APPs -  Physician Assistants and Nurse Practitioners) who all work together to provide you with the care you need, when you need it.  We recommend signing up for the patient portal called "MyChart".  Sign up information is provided on this After Visit Summary.  MyChart is used to connect with patients for Virtual Visits (Telemedicine).  Patients are able to view lab/test results, encounter notes, upcoming appointments, etc.  Non-urgent messages can be sent to your provider as well.   To learn more about what you can do with MyChart, go to [email or web address].    Your next appointment:   6 week(s)  The format for your next appointment:   In Person  Provider:   Randall An, PA-C   Other Instructions None       Thank you for choosing [facility] !

## 2020-07-10 NOTE — Progress Notes (Signed)
Cardiology Office Note  Date: 07/10/2020   ID: Tammy Small, DOB June 19, 1956, MRN [id number]  PCP:  Tammy Hawking, PA-C  Cardiologist:  Nona Dell, MD Electrophysiologist:  None   Chief Complaint  Patient presents with  . Shortness of Breath    History of Present Illness: Tammy Small is a 64 y.o. female referred for cardiology consultation by [name] PA-C with a reported history of heart failure and valvular heart disease.  She moved to this area from [place] a few months ago, established with primary care follow-up back in May.  She reports a history of dyspnea on exertion, NYHA class II-III, had run out of her medications for at least a month or 2 prior to establishing care here.  She has a longstanding history of hypertension.  Also reporting leg swelling around the time that she presented.  She tells me that both her breathing status and her leg swelling have improved since she has been started back on regular medications, not completely back to baseline.  Recent echocardiogram in July revealed LVEF greater than 75% with grade 2 diastolic dysfunction, normal RV contraction, mildly sclerotic aortic valve without stenosis, and a moderate sized circumferential pericardial effusion most prominent posteriorly.  I personally reviewed her ECG today which shows sinus bradycardia at 51 bpm, left atrial enlargement, and LVH with repolarization changes.  I reviewed her medications and most recent lab work as noted below.  Past Medical History:  Diagnosis Date  . Essential hypertension   . Hyperlipidemia   . Type 2 diabetes mellitus (HCC)     Past Surgical History:  Procedure Laterality Date  . CESAREAN SECTION      Current Outpatient Medications  Medication Sig Dispense Refill  . amLODipine (NORVASC) 10 MG tablet TAKE 1 Tablet BY MOUTH ONCE EVERY DAY 90 tablet 0  . aspirin 81 MG chewable tablet Chew 81 mg by mouth daily.    Marland Kitchen atorvastatin (LIPITOR) 20 MG tablet TAKE 1  Tablet BY MOUTH ONCE DAILY 90 tablet 0  . Cholecalciferol (VITAMIN D3 PO) Take 1 tablet by mouth daily.    Marland Kitchen losartan (COZAAR) 100 MG tablet TAKE 1 Tablet BY MOUTH ONCE DAILY 90 tablet 0  . Omega-3 Fatty Acids (OMEGA 3 PO) Take 1 capsule by mouth daily.    . Riboflavin (VITAMIN B-2 PO) Take 1 tablet by mouth daily.    . furosemide (LASIX) 20 MG tablet Take 1 tablet (20 mg total) by mouth daily. 90 tablet 3  . metoprolol tartrate (LOPRESSOR) 25 MG tablet Take 1 tablet (25 mg total) by mouth 2 (two) times daily. 180 tablet 3   No current facility-administered medications for this visit.   Allergies:  Patient has no known allergies.   Social History: The patient  reports that she quit smoking about 47 years ago. Her smoking use included cigarettes. She has a 1.50 pack-year smoking history. She has never used smokeless tobacco. She reports previous alcohol use. She reports previous drug use. Drug: Cocaine.   Family History: The patient's family history includes Diabetes in her father; Hypertension in her father.   ROS:   No palpitations or syncope.  Physical Exam: VS:  BP 120/72   Pulse (!) 51   Ht 5\' 2"  (1.575 m)   Wt 225 lb (102.1 kg)   SpO2 96%   BMI 41.15 kg/m , BMI Body mass index is 41.15 kg/m.  Wt Readings from Last 3 Encounters:  07/10/20 225 lb (102.1 kg)  06/25/20 231 lb  12.8 oz (105.1 kg)  05/27/20 231 lb 6.4 oz (105 kg)    General: Patient appears comfortable at rest. HEENT: Conjunctiva and lids normal, appears comfortable at rest. Neck: Supple, no elevated JVP or carotid bruits, no thyromegaly. Lungs: Clear to auscultation, nonlabored breathing at rest. Cardiac: Regular rate and rhythm, no S3, soft systolic murmur, no pericardial rub. Abdomen: Soft, nontender, bowel sounds present. Extremities: Chronic appearing lower leg edema and adipose tissue, no definite lymphedema. Skin: Warm and dry. Musculoskeletal: No kyphosis. Neuropsychiatric: Alert and oriented x3,  affect grossly appropriate.  ECG:  No old tracings for review today.  Recent Labwork: 04/01/2020: ALT 17; AST 22; Hemoglobin 13.4; Platelets 272 05/21/2020: BUN 16; Creatinine, Ser 1.21; Potassium 4.1; Sodium 140     Component Value Date/Time   CHOL 202 (H) 04/01/2020 0927   TRIG 112 04/01/2020 0927   HDL 58 04/01/2020 0927   CHOLHDL 3.5 04/01/2020 0927   VLDL 22 04/01/2020 0927   LDLCALC 122 (H) 04/01/2020 0927    Other Studies Reviewed Today:  Echocardiogram 05/21/2020: 1. Left ventricular ejection fraction, by estimation, is >75%. The left  ventricle has hyperdynamic function. The left ventricle has no regional  wall motion abnormalities. There is mild left ventricular hypertrophy.  Left ventricular diastolic parameters  are consistent with Grade II diastolic dysfunction (pseudonormalization).  2. Right ventricular systolic function is low normal. The right  ventricular size is normal. There is normal pulmonary artery systolic  pressure.  3. Left atrial size was mildly dilated.  4. Right atrial size was mild to moderately dilated.  5. Pericardial effsuion surrounds heart, largest collection is posterior  measuring about 14 mm. Does not appear to be hemodynamically destabilizing  by echo criteria. . Moderate pericardial effusion. The pericardial  effusion is circumferential.  6. The mitral valve is normal in structure. Trivial mitral valve  regurgitation.  7. The aortic valve is tricuspid. Aortic valve regurgitation is not  visualized. Mild aortic valve sclerosis is present, with no evidence of  aortic valve stenosis.  8. The inferior vena cava is dilated in size with >50% respiratory  variability, suggesting right atrial pressure of 8 mmHg.   Carotid Dopplers 06/02/2020: IMPRESSION: Minor carotid atherosclerosis. No hemodynamically significant ICA stenosis. Degree of narrowing less than 50% bilaterally by ultrasound criteria.  Patent antegrade vertebral flow  bilaterally  Assessment and Plan:  1.  Chronic diastolic heart failure.  Recent echocardiogram shows vigorous LVEF greater than 75% with moderate diastolic dysfunction and normal RV contraction with normal estimated PASP.  She is currently on losartan, Lopressor, and Norvasc with low-dose HCTZ.  Plan to switch from HCTZ to low-dose Lasix, follow-up BMET in a few weeks with clinical reevaluation thereafter.  Also reduce Lopressor to 25 mg twice daily given bradycardia with heart rate around 50.  Urged her to resume a regular walking regimen.  2.  Essential hypertension, on Cozaar, Norvasc, and Lopressor.  Blood pressure is well controlled today.  3.  Moderate sized, asymptomatic pericardial effusion as noted above.  We will ultimately plan to repeat an echocardiogram after she has been on Lasix for a while.  Medication Adjustments/Labs and Tests Ordered: Current medicines are reviewed at length with the patient today.  Concerns regarding medicines are outlined above.   Tests Ordered: Orders Placed This Encounter  Procedures  . Basic Metabolic Panel (BMET)  . EKG 12-Lead    Medication Changes: Meds ordered this encounter  Medications  . furosemide (LASIX) 20 MG tablet    Sig: Take  1 tablet (20 mg total) by mouth daily.    Dispense:  90 tablet    Refill:  3    07/10/20 HCTZ d/c'd  . metoprolol tartrate (LOPRESSOR) 25 MG tablet    Sig: Take 1 tablet (25 mg total) by mouth 2 (two) times daily.    Dispense:  180 tablet    Refill:  3    07/10/20 dose decreased to 25 mg BID    Disposition:  Follow up 6 weeks with [place] in the [place] office.  Signed, Jonelle Sidle, MD, [facility] 07/10/2020 10:51 AM    [facility] at [facility] [address], [place], [place] [address] Phone: [phone]; Fax: [phone]

## 2020-07-14 ENCOUNTER — Other Ambulatory Visit: Payer: Self-pay | Admitting: Physician Assistant

## 2020-07-14 DIAGNOSIS — I509 Heart failure, unspecified: Secondary | ICD-10-CM

## 2020-07-14 DIAGNOSIS — I10 Essential (primary) hypertension: Secondary | ICD-10-CM

## 2020-07-14 DIAGNOSIS — E785 Hyperlipidemia, unspecified: Secondary | ICD-10-CM

## 2020-07-14 DIAGNOSIS — N189 Chronic kidney disease, unspecified: Secondary | ICD-10-CM

## 2020-07-24 ENCOUNTER — Other Ambulatory Visit (HOSPITAL_COMMUNITY)
Admission: RE | Admit: 2020-07-24 | Discharge: 2020-07-24 | Disposition: A | Discharge status: Home / Self Care | Payer: Medicaid Other | Source: Ambulatory Visit | Attending: Cardiology | Admitting: Cardiology

## 2020-07-24 ENCOUNTER — Other Ambulatory Visit: Payer: Self-pay

## 2020-07-24 DIAGNOSIS — Z79899 Other long term (current) drug therapy: Secondary | ICD-10-CM | POA: Insufficient documentation

## 2020-07-24 LAB — BASIC METABOLIC PANEL
Anion gap: 10 (ref 5–15)
BUN: 19 mg/dL (ref 8–23)
CO2: 27 mmol/L (ref 22–32)
Calcium: 10.1 mg/dL (ref 8.9–10.3)
Chloride: 101 mmol/L (ref 98–111)
Creatinine, Ser: 1.26 mg/dL — ABNORMAL HIGH (ref 0.44–1.00)
GFR calc Af Amer: 53 mL/min — ABNORMAL LOW (ref >60)
GFR calc non Af Amer: 46 mL/min — ABNORMAL LOW (ref >60)
Glucose, Bld: 101 mg/dL — ABNORMAL HIGH (ref 70–99)
Potassium: 3.8 mmol/L (ref 3.5–5.1)
Sodium: 138 mmol/L (ref 135–145)

## 2020-07-28 ENCOUNTER — Ambulatory Visit: Payer: Medicaid Other | Admitting: Physician Assistant

## 2020-07-28 ENCOUNTER — Other Ambulatory Visit: Payer: Self-pay

## 2020-07-28 ENCOUNTER — Other Ambulatory Visit (HOSPITAL_COMMUNITY)
Admission: RE | Admit: 2020-07-28 | Discharge: 2020-07-28 | Disposition: A | Discharge status: Home / Self Care | Payer: Medicaid Other | Source: Ambulatory Visit | Attending: Physician Assistant | Admitting: Physician Assistant

## 2020-07-28 ENCOUNTER — Encounter: Payer: Self-pay | Admitting: Physician Assistant

## 2020-07-28 VITALS — BP 100/60 | HR 58 | Temp 97.3°F | Ht 62.0 in | Wt 228.2 lb

## 2020-07-28 DIAGNOSIS — I509 Heart failure, unspecified: Secondary | ICD-10-CM

## 2020-07-28 DIAGNOSIS — I10 Essential (primary) hypertension: Secondary | ICD-10-CM | POA: Insufficient documentation

## 2020-07-28 DIAGNOSIS — E785 Hyperlipidemia, unspecified: Secondary | ICD-10-CM

## 2020-07-28 DIAGNOSIS — N189 Chronic kidney disease, unspecified: Secondary | ICD-10-CM | POA: Insufficient documentation

## 2020-07-28 LAB — COMPREHENSIVE METABOLIC PANEL
ALT: 19 U/L (ref 0–44)
AST: 26 U/L (ref 15–41)
Albumin: 4.4 g/dL (ref 3.5–5.0)
Alkaline Phosphatase: 54 U/L (ref 38–126)
Anion gap: 11 (ref 5–15)
BUN: 19 mg/dL (ref 8–23)
CO2: 27 mmol/L (ref 22–32)
Calcium: 10.2 mg/dL (ref 8.9–10.3)
Chloride: 100 mmol/L (ref 98–111)
Creatinine, Ser: 1.34 mg/dL — ABNORMAL HIGH (ref 0.44–1.00)
GFR calc Af Amer: 49 mL/min — ABNORMAL LOW (ref >60)
GFR calc non Af Amer: 42 mL/min — ABNORMAL LOW (ref >60)
Glucose, Bld: 106 mg/dL — ABNORMAL HIGH (ref 70–99)
Potassium: 3.7 mmol/L (ref 3.5–5.1)
Sodium: 138 mmol/L (ref 135–145)
Total Bilirubin: 1.6 mg/dL — ABNORMAL HIGH (ref 0.3–1.2)
Total Protein: 8 g/dL (ref 6.5–8.1)

## 2020-07-28 LAB — LIPID PANEL
Cholesterol: 129 mg/dL (ref 0–200)
HDL: 43 mg/dL (ref >40)
LDL Cholesterol: 49 mg/dL (ref 0–99)
Total CHOL/HDL Ratio: 3 RATIO
Triglycerides: 186 mg/dL — ABNORMAL HIGH (ref <150)
VLDL: 37 mg/dL (ref 0–40)

## 2020-07-28 NOTE — Progress Notes (Signed)
BP 100/60   Pulse (!) 58   Temp (!) 97.3 F (36.3 C)   Ht 5\' 2"  (1.575 m)   Wt 228 lb 3.2 oz (103.5 kg)   SpO2 93%   BMI 41.74 kg/m    Subjective:    Patient ID: , female    DOB: Jul 23, 1956, 64 y.o.   MRN: [id number]  HPI: Tammy Small is a 64 y.o. female presenting on 07/28/2020 for Hypertension and Hyperlipidemia   HPI   Pt had a negative covid 19 screening questionnaire.    Pt is a pleasant 63yoF with follow up for HTN and dyslipidemia today.  She saw cardiologist several weeks ago with recommendations to change hctz to lasix and plans to repeat echo after she has been on the lasix.  Her metoprolol was also decreased to 25mg  but she hasn't yet changed yet.    She has not started walking again as she discussed with cardiologist.  She says she is feeling well and is looking forward to      Relevant past medical, surgical, family and social history reviewed and updated as indicated. Interim medical history since our last visit reviewed. Allergies and medications reviewed and updated.   Current Outpatient Medications:  .  amLODipine (NORVASC) 10 MG tablet, TAKE 1 Tablet BY MOUTH ONCE EVERY DAY, Disp: 90 tablet, Rfl: 0 .  aspirin 81 MG chewable tablet, Chew 81 mg by mouth daily., Disp: , Rfl:  .  atorvastatin (LIPITOR) 20 MG tablet, TAKE 1 Tablet BY MOUTH ONCE DAILY, Disp: 90 tablet, Rfl: 0 .  Cholecalciferol (VITAMIN D3 PO), Take 1 tablet by mouth daily., Disp: , Rfl:  .  furosemide (LASIX) 20 MG tablet, Take 1 tablet (20 mg total) by mouth daily., Disp: 90 tablet, Rfl: 3 .  losartan (COZAAR) 100 MG tablet, TAKE 1 Tablet BY MOUTH ONCE DAILY, Disp: 90 tablet, Rfl: 0 .  metoprolol tartrate (LOPRESSOR) 50 MG tablet, Take 50 mg by mouth 2 (two) times daily., Disp: , Rfl:  .  Omega-3 Fatty Acids (OMEGA 3 PO), Take 1 capsule by mouth daily., Disp: , Rfl:  .  Riboflavin (VITAMIN B-2 PO), Take 1 tablet by mouth daily., Disp: , Rfl:  .  metoprolol tartrate  (LOPRESSOR) 25 MG tablet, Take 1 tablet (25 mg total) by mouth 2 (two) times daily. (Patient not taking: Reported on 07/28/2020), Disp: 180 tablet, Rfl: 3    Review of Systems  Per HPI unless specifically indicated above     Objective:    BP 100/60   Pulse (!) 58   Temp (!) 97.3 F (36.3 C)   Ht 5\' 2"  (1.575 m)   Wt 228 lb 3.2 oz (103.5 kg)   SpO2 93%   BMI 41.74 kg/m   Wt Readings from Last 3 Encounters:  07/28/20 228 lb 3.2 oz (103.5 kg)  07/10/20 225 lb (102.1 kg)  06/25/20 231 lb 12.8 oz (105.1 kg)    Physical Exam Vitals reviewed.  Constitutional:      General: She is not in acute distress.    Appearance: She is well-developed. She is obese. She is not ill-appearing.  HENT:     Head: Normocephalic and atraumatic.  Cardiovascular:     Rate and Rhythm: Normal rate and regular rhythm.     Heart sounds: Murmur heard.   Pulmonary:     Effort: Pulmonary effort is normal.     Breath sounds: Normal breath sounds.  Abdominal:     General: Bowel sounds  are normal.     Palpations: Abdomen is soft. There is no mass.     Tenderness: There is no abdominal tenderness.  Musculoskeletal:     Cervical back: Neck supple.     Right lower leg: No edema.     Left lower leg: No edema.  Lymphadenopathy:     Cervical: No cervical adenopathy.  Skin:    General: Skin is warm and dry.  Neurological:     Mental Status: She is alert and oriented to person, place, and time.  Psychiatric:        Attention and Perception: Attention normal.        Mood and Affect: Mood normal.        Speech: Speech normal.        Behavior: Behavior normal. Behavior is cooperative.            Assessment & Plan:    Encounter Diagnoses  Name Primary?  . HTN (hypertension), malignant Yes  . Hyperlipidemia, unspecified hyperlipidemia type   . Chronic kidney disease, unspecified CKD stage   . Chronic heart failure, unspecified heart failure type (HCC)   . Morbid obesity (HCC)        -unfortunately, when she went to the labs, the [facility] for Dr Diona Browner was done, not the lipids that were ordered from this office.  She is to get the fasting labs done and she will be called with results.   -no changes to medications (just reinforced changes discussed with Dr Diona Browner) -she is encouraged to get back to regular walking routine -will continue to monitor renal function, particularly in light of recent addition of lasix -pt to follow up 3 months.  She is to contact office sooner prn

## 2020-07-28 NOTE — Patient Instructions (Signed)
Use a PILL CUTTER for your metoprolol 50mg 

## 2020-09-03 ENCOUNTER — Encounter: Payer: Self-pay | Admitting: Cardiology

## 2020-09-03 ENCOUNTER — Other Ambulatory Visit: Payer: Self-pay

## 2020-09-03 ENCOUNTER — Ambulatory Visit (INDEPENDENT_AMBULATORY_CARE_PROVIDER_SITE_OTHER): Payer: Self-pay | Admitting: Cardiology

## 2020-09-03 VITALS — BP 132/70 | HR 60 | Ht 62.0 in | Wt 231.0 lb

## 2020-09-03 DIAGNOSIS — I3139 Other pericardial effusion (noninflammatory): Secondary | ICD-10-CM

## 2020-09-03 DIAGNOSIS — I10 Essential (primary) hypertension: Secondary | ICD-10-CM

## 2020-09-03 DIAGNOSIS — I5189 Other ill-defined heart diseases: Secondary | ICD-10-CM

## 2020-09-03 DIAGNOSIS — I313 Pericardial effusion (noninflammatory): Secondary | ICD-10-CM

## 2020-09-03 NOTE — Progress Notes (Signed)
Cardiology Office Note  Date: 09/03/2020   ID: Tammy Small, DOB 1955/11/22, MRN [id number]  PCP:  Jacquelin Hawking, PA-C  Cardiologist:  Nona Dell, MD Electrophysiologist:  None   Chief Complaint  Patient presents with  . Cardiac follow-up    History of Present Illness: Tammy Small is a 64 y.o. female seen in consultation back in September. She presents for a follow-up visit. States that she feels well overall, no progressive shortness of breath, still has fluctuating leg swelling, more notable when she has been up on her feet for prolonged period of time. She does not describe any orthopnea or PND.  At the last visit she was taken off HCTZ with conversion to Lasix, Lopressor dose was also decreased. She has tolerated this well. Follow-up lab work is noted below. Creatinine 1.34 up from 1.2 range and potassium normal.  I reviewed the remainder of her medications which are outlined below. I also see that her lipids looked well controlled on Lipitor, LDL 49.  Past Medical History:  Diagnosis Date  . Essential hypertension   . Hyperlipidemia   . Type 2 diabetes mellitus (HCC)     Past Surgical History:  Procedure Laterality Date  . CESAREAN SECTION      Current Outpatient Medications  Medication Sig Dispense Refill  . amLODipine (NORVASC) 10 MG tablet TAKE 1 Tablet BY MOUTH ONCE EVERY DAY 90 tablet 0  . aspirin 81 MG chewable tablet Chew 81 mg by mouth daily.    Marland Kitchen atorvastatin (LIPITOR) 20 MG tablet TAKE 1 Tablet BY MOUTH ONCE DAILY 90 tablet 0  . Cholecalciferol (VITAMIN D3 PO) Take 1 tablet by mouth daily.    . furosemide (LASIX) 20 MG tablet Take 1 tablet (20 mg total) by mouth daily. 90 tablet 3  . losartan (COZAAR) 100 MG tablet TAKE 1 Tablet BY MOUTH ONCE DAILY 90 tablet 0  . metoprolol tartrate (LOPRESSOR) 25 MG tablet Take 1 tablet (25 mg total) by mouth 2 (two) times daily. 180 tablet 3  . Omega-3 Fatty Acids (OMEGA 3 PO) Take 1 capsule by mouth daily.      . Riboflavin (VITAMIN B-2 PO) Take 1 tablet by mouth daily.     No current facility-administered medications for this visit.   Allergies:  Patient has no known allergies.   ROS: No palpitations or syncope.  Physical Exam: VS:  BP 132/70   Pulse 60   Ht 5\' 2"  (1.575 m)   Wt 231 lb (104.8 kg)   SpO2 90%   BMI 42.25 kg/m , BMI Body mass index is 42.25 kg/m.  Wt Readings from Last 3 Encounters:  09/03/20 231 lb (104.8 kg)  07/28/20 228 lb 3.2 oz (103.5 kg)  07/10/20 225 lb (102.1 kg)    General: Patient appears comfortable at rest. HEENT: Conjunctiva and lids normal, wearing a mask. Neck: Supple, no elevated JVP or carotid bruits, no thyromegaly. Lungs: Clear to auscultation, nonlabored breathing at rest. Cardiac: Regular rate and rhythm, no S3, soft systolic murmur, no pericardial rub. Extremities: Chronic appearing and stable lower leg edema.  ECG:  An ECG dated 07/10/2020 was personally reviewed today and demonstrated:  Sinus bradycardia, left atrial enlargement, LVH with repolarization abnormalities.  Recent Labwork: 04/01/2020: Hemoglobin 13.4; Platelets 272 07/28/2020: ALT 19; AST 26; BUN 19; Creatinine, Ser 1.34; Potassium 3.7; Sodium 138     Component Value Date/Time   CHOL 129 07/28/2020 0849   TRIG 186 (H) 07/28/2020 0849   HDL 43 07/28/2020 0849  CHOLHDL 3.0 07/28/2020 0849   VLDL 37 07/28/2020 0849   LDLCALC 49 07/28/2020 0849    Other Studies Reviewed Today:  Echocardiogram 05/21/2020: 1. Left ventricular ejection fraction, by estimation, is >75%. The left  ventricle has hyperdynamic function. The left ventricle has no regional  wall motion abnormalities. There is mild left ventricular hypertrophy.  Left ventricular diastolic parameters  are consistent with Grade II diastolic dysfunction (pseudonormalization).  2. Right ventricular systolic function is low normal. The right  ventricular size is normal. There is normal pulmonary artery systolic   pressure.  3. Left atrial size was mildly dilated.  4. Right atrial size was mild to moderately dilated.  5. Pericardial effsuion surrounds heart, largest collection is posterior  measuring about 14 mm. Does not appear to be hemodynamically destabilizing  by echo criteria. . Moderate pericardial effusion. The pericardial  effusion is circumferential.  6. The mitral valve is normal in structure. Trivial mitral valve  regurgitation.  7. The aortic valve is tricuspid. Aortic valve regurgitation is not  visualized. Mild aortic valve sclerosis is present, with no evidence of  aortic valve stenosis.  8. The inferior vena cava is dilated in size with >50% respiratory  variability, suggesting right atrial pressure of 8 mmHg.   Carotid Dopplers 06/02/2020: IMPRESSION: Minor carotid atherosclerosis. No hemodynamically significant ICA stenosis. Degree of narrowing less than 50% bilaterally by ultrasound criteria.  Patent antegrade vertebral flow bilaterally.  Assessment and Plan:  1. Chronic diastolic heart failure, LVEF vigorous greater than 75% with moderate diastolic dysfunction and normal RV contraction. She is tolerating the addition of Lasix, mild bump in creatinine noted but did have CKD stage IIIb at baseline. She is also on losartan, Lopressor, and Norvasc. No changes were made today. She will continue to follow in the interim with PCP, and would suggest recheck BMET at next encounter. She may ultimately need a potassium supplement. If creatinine continues to increase, can reduce Lasix to every other day.  2. CKD stage IIIb, creatinine 1.26 up to 1.34.  3. Asymptomatic pericardial effusion, moderate by echocardiogram in July. We will obtain a follow-up limited study for reevaluation.  Medication Adjustments/Labs and Tests Ordered: Current medicines are reviewed at length with the patient today.  Concerns regarding medicines are outlined above.   Tests Ordered: Orders Placed  This Encounter  Procedures  . ECHOCARDIOGRAM LIMITED    Medication Changes: No orders of the defined types were placed in this encounter.   Disposition:  Follow up Test results and determine disposition, potentially annual follow-up with interval visits per PCP.  Signed, Jonelle Sidle, MD, [facility] 09/03/2020 1:29 PM    [facility] at [facility] [address], [place], [place] [address] Phone: [phone]; Fax: [phone]

## 2020-09-03 NOTE — Patient Instructions (Addendum)
Medication Instructions:  Your physician recommends that you continue on your current medications as directed. Please refer to the Current Medication list given to you today.  *If you need a refill on your cardiac medications before your next appointment, please call your pharmacy*   Lab Work: None today  If you have labs (blood work) drawn today and your tests are completely normal, you will receive your results only by: Marland Kitchen MyChart Message (if you have MyChart) OR . A paper copy in the mail If you have any lab test that is abnormal or we need to change your treatment, we will call you to review the results.   Testing/Procedures: Your physician has requested that you have a limited echocardiogram. Echocardiography is a painless test that uses sound waves to create images of your heart. It provides your doctor with information about the size and shape of your heart and how well your heart's chambers and valves are working. This procedure takes approximately one hour. There are no restrictions for this procedure.     Follow-Up: At [facility], you and your health needs are our priority.  As part of our continuing mission to provide you with exceptional heart care, we have created designated Provider Care Teams.  These Care Teams include your primary Cardiologist (physician) and Advanced Practice Providers (APPs -  Physician Assistants and Nurse Practitioners) who all work together to provide you with the care you need, when you need it.  We recommend signing up for the patient portal called "MyChart".  Sign up information is provided on this After Visit Summary.  MyChart is used to connect with patients for Virtual Visits (Telemedicine).  Patients are able to view lab/test results, encounter notes, upcoming appointments, etc.  Non-urgent messages can be sent to your provider as well.   To learn more about what you can do with MyChart, go to [email or web address].    Your next  appointment:   12 month(s)  The format for your next appointment:   In Person  Provider:   Nona Dell, MD   Other Instructions None      Thank you for choosing [facility] !

## 2020-09-08 ENCOUNTER — Other Ambulatory Visit: Payer: Self-pay

## 2020-09-08 ENCOUNTER — Ambulatory Visit (HOSPITAL_COMMUNITY)
Admission: RE | Admit: 2020-09-08 | Discharge: 2020-09-08 | Disposition: A | Discharge status: Home / Self Care | Payer: Self-pay | Source: Ambulatory Visit | Attending: Cardiology | Admitting: Cardiology

## 2020-09-08 DIAGNOSIS — I3139 Other pericardial effusion (noninflammatory): Secondary | ICD-10-CM

## 2020-09-08 DIAGNOSIS — I313 Pericardial effusion (noninflammatory): Secondary | ICD-10-CM | POA: Insufficient documentation

## 2020-09-08 NOTE — Progress Notes (Signed)
*  PRELIMINARY RESULTS* Echocardiogram Limited 2-D Echocardiogram has been performed.  Stacey Drain 09/08/2020, 9:18 AM

## 2020-10-19 ENCOUNTER — Other Ambulatory Visit: Payer: Self-pay | Admitting: Physician Assistant

## 2020-10-22 ENCOUNTER — Other Ambulatory Visit: Payer: Self-pay | Admitting: Physician Assistant

## 2020-10-22 DIAGNOSIS — N189 Chronic kidney disease, unspecified: Secondary | ICD-10-CM

## 2020-10-22 DIAGNOSIS — I10 Essential (primary) hypertension: Secondary | ICD-10-CM

## 2020-10-22 DIAGNOSIS — E785 Hyperlipidemia, unspecified: Secondary | ICD-10-CM

## 2020-11-12 ENCOUNTER — Other Ambulatory Visit (HOSPITAL_COMMUNITY)
Admission: RE | Admit: 2020-11-12 | Discharge: 2020-11-12 | Disposition: A | Discharge status: Home / Self Care | Payer: Medicaid Other | Source: Ambulatory Visit | Attending: Physician Assistant | Admitting: Physician Assistant

## 2020-11-12 ENCOUNTER — Other Ambulatory Visit: Payer: Self-pay

## 2020-11-12 DIAGNOSIS — E785 Hyperlipidemia, unspecified: Secondary | ICD-10-CM

## 2020-11-12 DIAGNOSIS — Z8679 Personal history of other diseases of the circulatory system: Secondary | ICD-10-CM

## 2020-11-12 DIAGNOSIS — I10 Essential (primary) hypertension: Secondary | ICD-10-CM

## 2020-11-12 DIAGNOSIS — N189 Chronic kidney disease, unspecified: Secondary | ICD-10-CM

## 2020-11-12 LAB — COMPREHENSIVE METABOLIC PANEL
ALT: 13 U/L (ref 0–44)
AST: 19 U/L (ref 15–41)
Albumin: 4 g/dL (ref 3.5–5.0)
Alkaline Phosphatase: 61 U/L (ref 38–126)
Anion gap: 11 (ref 5–15)
BUN: 21 mg/dL (ref 8–23)
CO2: 26 mmol/L (ref 22–32)
Calcium: 9.3 mg/dL (ref 8.9–10.3)
Chloride: 104 mmol/L (ref 98–111)
Creatinine, Ser: 1.25 mg/dL — ABNORMAL HIGH (ref 0.44–1.00)
GFR, Estimated: 48 mL/min — ABNORMAL LOW (ref >60)
Glucose, Bld: 108 mg/dL — ABNORMAL HIGH (ref 70–99)
Potassium: 3.9 mmol/L (ref 3.5–5.1)
Sodium: 141 mmol/L (ref 135–145)
Total Bilirubin: 1.1 mg/dL (ref 0.3–1.2)
Total Protein: 7.2 g/dL (ref 6.5–8.1)

## 2020-11-12 LAB — LIPID PANEL
Cholesterol: 113 mg/dL (ref 0–200)
HDL: 41 mg/dL (ref >40)
LDL Cholesterol: 61 mg/dL (ref 0–99)
Total CHOL/HDL Ratio: 2.8 RATIO
Triglycerides: 54 mg/dL (ref <150)
VLDL: 11 mg/dL (ref 0–40)

## 2020-11-16 ENCOUNTER — Other Ambulatory Visit: Payer: Self-pay

## 2020-11-16 ENCOUNTER — Encounter: Payer: Self-pay | Admitting: Physician Assistant

## 2020-11-16 ENCOUNTER — Ambulatory Visit: Payer: Medicaid Other | Admitting: Physician Assistant

## 2020-11-16 VITALS — BP 121/69 | HR 68 | Temp 95.4°F | Ht 62.0 in | Wt 226.2 lb

## 2020-11-16 DIAGNOSIS — E785 Hyperlipidemia, unspecified: Secondary | ICD-10-CM

## 2020-11-16 DIAGNOSIS — N189 Chronic kidney disease, unspecified: Secondary | ICD-10-CM

## 2020-11-16 DIAGNOSIS — I509 Heart failure, unspecified: Secondary | ICD-10-CM

## 2020-11-16 DIAGNOSIS — I10 Essential (primary) hypertension: Secondary | ICD-10-CM

## 2020-11-16 DIAGNOSIS — Z1239 Encounter for other screening for malignant neoplasm of breast: Secondary | ICD-10-CM

## 2020-11-16 DIAGNOSIS — R011 Cardiac murmur, unspecified: Secondary | ICD-10-CM

## 2020-11-16 DIAGNOSIS — R7303 Prediabetes: Secondary | ICD-10-CM

## 2020-11-16 NOTE — Patient Instructions (Signed)
[facility] covid booster - [phone]

## 2020-11-16 NOTE — Progress Notes (Signed)
BP 121/69    Pulse 68    Temp (!) 95.4 F (35.2 C)    Ht 5\' 2"  (1.575 m)    Wt 226 lb 4 oz (102.6 kg)    SpO2 98%    BMI 41.38 kg/m    Subjective:    Patient ID: Tammy Small, female    DOB: 04/19/1956, 65 y.o.   MRN: [id number]  HPI: Tammy Small is a 65 y.o. female presenting on 11/16/2020 for Hypertension (i) and Hyperlipidemia   HPI   Pt had a negative covid 19 screening questionnaire.   Chief Complaint  Patient presents with   Hypertension    i   Hyperlipidemia   Pt says she is doing well today.  No CP or SOB.  She is only walking "a little bit"    Relevant past medical, surgical, family and social history reviewed and updated as indicated. Interim medical history since our last visit reviewed. Allergies and medications reviewed and updated.   Current Outpatient Medications:    amLODipine (NORVASC) 10 MG tablet, TAKE 1 Tablet BY MOUTH ONCE EVERY DAY, Disp: 90 tablet, Rfl: 0   aspirin 81 MG chewable tablet, Chew 81 mg by mouth daily., Disp: , Rfl:    atorvastatin (LIPITOR) 20 MG tablet, TAKE 1 Tablet BY MOUTH ONCE DAILY, Disp: 90 tablet, Rfl: 0   Cholecalciferol (VITAMIN D3 PO), Take 1 tablet by mouth daily., Disp: , Rfl:    furosemide (LASIX) 20 MG tablet, Take 1 tablet (20 mg total) by mouth daily., Disp: 90 tablet, Rfl: 3   losartan (COZAAR) 100 MG tablet, TAKE 1 Tablet BY MOUTH ONCE DAILY, Disp: 90 tablet, Rfl: 0   metoprolol tartrate (LOPRESSOR) 25 MG tablet, Take 1 tablet (25 mg total) by mouth 2 (two) times daily., Disp: 180 tablet, Rfl: 3   Omega-3 Fatty Acids (OMEGA 3 PO), Take 1 capsule by mouth daily., Disp: , Rfl:    Riboflavin (VITAMIN B-2 PO), Take 1 tablet by mouth daily., Disp: , Rfl:      Review of Systems  Per HPI unless specifically indicated above     Objective:    BP 121/69    Pulse 68    Temp (!) 95.4 F (35.2 C)    Ht 5\' 2"  (1.575 m)    Wt 226 lb 4 oz (102.6 kg)    SpO2 98%    BMI 41.38 kg/m   Wt Readings from Last 3  Encounters:  11/16/20 226 lb 4 oz (102.6 kg)  09/03/20 231 lb (104.8 kg)  07/28/20 228 lb 3.2 oz (103.5 kg)    Physical Exam Vitals reviewed.  Constitutional:      General: She is not in acute distress.    Appearance: She is well-developed and well-nourished. She is not ill-appearing.  HENT:     Head: Normocephalic and atraumatic.  Cardiovascular:     Rate and Rhythm: Normal rate and regular rhythm.     Heart sounds: Murmur heard.    Pulmonary:     Effort: Pulmonary effort is normal.     Breath sounds: Normal breath sounds.  Abdominal:     General: Bowel sounds are normal.     Palpations: Abdomen is soft. There is no hepatosplenomegaly or mass.     Tenderness: There is no abdominal tenderness.  Musculoskeletal:        General: No edema.     Cervical back: Neck supple.     Right lower leg: No edema.  Left lower leg: No edema.  Lymphadenopathy:     Cervical: No cervical adenopathy.  Skin:    General: Skin is warm and dry.  Neurological:     Mental Status: She is alert and oriented to person, place, and time.  Psychiatric:        Mood and Affect: Mood and affect normal.        Behavior: Behavior normal.     Results for orders placed or performed during the hospital encounter of 11/12/20  Lipid panel  Result Value Ref Range   Cholesterol 113 0 - 200 mg/dL   Triglycerides 54 <170 mg/dL   HDL 41 >01 mg/dL   Total CHOL/HDL Ratio 2.8 RATIO   VLDL 11 0 - 40 mg/dL   LDL Cholesterol 61 0 - 99 mg/dL  Comprehensive metabolic panel  Result Value Ref Range   Sodium 141 135 - 145 mmol/L   Potassium 3.9 3.5 - 5.1 mmol/L   Chloride 104 98 - 111 mmol/L   CO2 26 22 - 32 mmol/L   Glucose, Bld 108 (H) 70 - 99 mg/dL   BUN 21 8 - 23 mg/dL   Creatinine, Ser 7.49 (H) 0.44 - 1.00 mg/dL   Calcium 9.3 8.9 - 44.9 mg/dL   Total Protein 7.2 6.5 - 8.1 g/dL   Albumin 4.0 3.5 - 5.0 g/dL   AST 19 15 - 41 U/L   ALT 13 0 - 44 U/L   Alkaline Phosphatase 61 38 - 126 U/L   Total Bilirubin  1.1 0.3 - 1.2 mg/dL   GFR, Estimated 48 (L) >60 mL/min   Anion gap 11 5 - 15      Assessment & Plan:   Encounter Diagnoses  Name Primary?   HTN (hypertension), malignant Yes   Hyperlipidemia, unspecified hyperlipidemia type    Chronic kidney disease, unspecified CKD stage    Chronic heart failure, unspecified heart failure type (HCC)    Heart murmur    Prediabetes    Morbid obesity (HCC)    Encounter for screening for malignant neoplasm of breast, unspecified screening modality      -Reviewed labs with pt  -refer for screening mammogram which is due end of february -Encouraged covid booster -Encouraged to increase exercise/walking -Pt to continue current medications -Pt to follow up 3 months.  She is to contact office sooner prn

## 2020-11-17 ENCOUNTER — Other Ambulatory Visit: Payer: Self-pay | Admitting: Student

## 2020-11-17 DIAGNOSIS — Z1239 Encounter for other screening for malignant neoplasm of breast: Secondary | ICD-10-CM

## 2020-11-26 ENCOUNTER — Ambulatory Visit: Payer: Self-pay | Attending: Internal Medicine

## 2020-11-26 DIAGNOSIS — Z23 Encounter for immunization: Secondary | ICD-10-CM

## 2020-11-26 NOTE — Progress Notes (Signed)
   Covid-19 Vaccination Clinic  Name:  Kanijah Groseclose    MRN: [id number] DOB: 1956/05/04  11/26/2020  Ms. Mazzola was observed post Covid-19 immunization for 15 minutes without incident. She was provided with Vaccine Information Sheet and instruction to access the V-Safe system.   Ms. Wrobleski was instructed to call 911 with any severe reactions post vaccine: Marland Kitchen Difficulty breathing  . Swelling of face and throat  . A fast heartbeat  . A bad rash all over body  . Dizziness and weakness   Immunizations Administered    Name Date Dose VIS Date Route   Pfizer COVID-19 Vaccine 11/26/2020  1:09 PM 0.3 mL 08/26/2020 Intramuscular   Manufacturer: [employer], [employer]   Lot: [id number]   NDC: [address]-2

## 2020-12-28 ENCOUNTER — Encounter: Payer: Self-pay | Admitting: Physician Assistant

## 2021-01-04 ENCOUNTER — Other Ambulatory Visit: Payer: Self-pay

## 2021-01-04 ENCOUNTER — Ambulatory Visit (HOSPITAL_COMMUNITY)
Admission: RE | Admit: 2021-01-04 | Discharge: 2021-01-04 | Disposition: A | Discharge status: Home / Self Care | Payer: Self-pay | Source: Ambulatory Visit | Attending: Physician Assistant | Admitting: Physician Assistant

## 2021-01-04 ENCOUNTER — Other Ambulatory Visit: Payer: Self-pay | Admitting: Physician Assistant

## 2021-01-04 DIAGNOSIS — Z1239 Encounter for other screening for malignant neoplasm of breast: Secondary | ICD-10-CM | POA: Insufficient documentation

## 2021-01-13 ENCOUNTER — Other Ambulatory Visit: Payer: Self-pay | Admitting: Physician Assistant

## 2021-01-13 ENCOUNTER — Inpatient Hospital Stay
Admission: RE | Admit: 2021-01-13 | Discharge: 2021-01-13 | Disposition: A | Discharge status: Home / Self Care | Payer: Self-pay | Source: Ambulatory Visit | Attending: Physician Assistant | Admitting: Physician Assistant

## 2021-01-13 ENCOUNTER — Ambulatory Visit
Admission: RE | Admit: 2021-01-13 | Discharge: 2021-01-13 | Disposition: A | Discharge status: Home / Self Care | Payer: Self-pay | Source: Ambulatory Visit | Attending: Physician Assistant | Admitting: Physician Assistant

## 2021-01-13 DIAGNOSIS — Z1239 Encounter for other screening for malignant neoplasm of breast: Secondary | ICD-10-CM

## 2021-02-03 ENCOUNTER — Other Ambulatory Visit: Payer: Self-pay | Admitting: Physician Assistant

## 2021-02-03 DIAGNOSIS — I10 Essential (primary) hypertension: Secondary | ICD-10-CM

## 2021-02-03 DIAGNOSIS — E785 Hyperlipidemia, unspecified: Secondary | ICD-10-CM

## 2021-02-03 DIAGNOSIS — R7303 Prediabetes: Secondary | ICD-10-CM

## 2021-02-03 DIAGNOSIS — I509 Heart failure, unspecified: Secondary | ICD-10-CM

## 2021-02-03 DIAGNOSIS — N189 Chronic kidney disease, unspecified: Secondary | ICD-10-CM

## 2021-02-12 ENCOUNTER — Other Ambulatory Visit (HOSPITAL_COMMUNITY)
Admission: RE | Admit: 2021-02-12 | Discharge: 2021-02-12 | Disposition: A | Discharge status: Home / Self Care | Payer: Medicaid Other | Source: Ambulatory Visit | Attending: Physician Assistant | Admitting: Physician Assistant

## 2021-02-12 DIAGNOSIS — I10 Essential (primary) hypertension: Secondary | ICD-10-CM

## 2021-02-12 DIAGNOSIS — I509 Heart failure, unspecified: Secondary | ICD-10-CM | POA: Insufficient documentation

## 2021-02-12 DIAGNOSIS — E785 Hyperlipidemia, unspecified: Secondary | ICD-10-CM | POA: Insufficient documentation

## 2021-02-12 DIAGNOSIS — N189 Chronic kidney disease, unspecified: Secondary | ICD-10-CM | POA: Insufficient documentation

## 2021-02-12 DIAGNOSIS — R7303 Prediabetes: Secondary | ICD-10-CM

## 2021-02-12 LAB — COMPREHENSIVE METABOLIC PANEL
ALT: 18 U/L (ref 0–44)
AST: 23 U/L (ref 15–41)
Albumin: 4.3 g/dL (ref 3.5–5.0)
Alkaline Phosphatase: 69 U/L (ref 38–126)
Anion gap: 10 (ref 5–15)
BUN: 18 mg/dL (ref 8–23)
CO2: 27 mmol/L (ref 22–32)
Calcium: 10 mg/dL (ref 8.9–10.3)
Chloride: 104 mmol/L (ref 98–111)
Creatinine, Ser: 1.22 mg/dL — ABNORMAL HIGH (ref 0.44–1.00)
GFR, Estimated: 50 mL/min — ABNORMAL LOW (ref >60)
Glucose, Bld: 114 mg/dL — ABNORMAL HIGH (ref 70–99)
Potassium: 4.2 mmol/L (ref 3.5–5.1)
Sodium: 141 mmol/L (ref 135–145)
Total Bilirubin: 1.6 mg/dL — ABNORMAL HIGH (ref 0.3–1.2)
Total Protein: 7.4 g/dL (ref 6.5–8.1)

## 2021-02-12 LAB — LIPID PANEL
Cholesterol: 122 mg/dL (ref 0–200)
HDL: 48 mg/dL (ref >40)
LDL Cholesterol: 48 mg/dL (ref 0–99)
Total CHOL/HDL Ratio: 2.5 RATIO
Triglycerides: 129 mg/dL (ref <150)
VLDL: 26 mg/dL (ref 0–40)

## 2021-02-12 LAB — HEMOGLOBIN A1C
Hgb A1c MFr Bld: 6.3 % — ABNORMAL HIGH (ref 4.8–5.6)
Mean Plasma Glucose: 134.11 mg/dL

## 2021-02-15 ENCOUNTER — Encounter: Payer: Self-pay | Admitting: Physician Assistant

## 2021-02-15 ENCOUNTER — Other Ambulatory Visit: Payer: Self-pay

## 2021-02-15 ENCOUNTER — Ambulatory Visit: Payer: Medicaid Other | Admitting: Physician Assistant

## 2021-02-15 VITALS — BP 107/64 | HR 65 | Temp 97.6°F | Wt 229.0 lb

## 2021-02-15 DIAGNOSIS — R7303 Prediabetes: Secondary | ICD-10-CM

## 2021-02-15 DIAGNOSIS — I10 Essential (primary) hypertension: Secondary | ICD-10-CM

## 2021-02-15 DIAGNOSIS — E785 Hyperlipidemia, unspecified: Secondary | ICD-10-CM

## 2021-02-15 DIAGNOSIS — N189 Chronic kidney disease, unspecified: Secondary | ICD-10-CM

## 2021-02-15 NOTE — Progress Notes (Signed)
BP 107/64   Pulse 65   Temp 97.6 F (36.4 C)   Wt 229 lb (103.9 kg)   SpO2 96%   BMI 41.88 kg/m    Subjective:    Patient ID: Tammy Small, female    DOB: 01-26-1956, 65 y.o.   MRN: [id number]  HPI: Tammy Small is a 65 y.o. female presenting on 02/15/2021 for Hyperlipidemia and Hypertension   HPI  Pt had a negative covid 19 screening questionnaire.  Chief Complaint  Patient presents with  . Hyperlipidemia  . Hypertension   Pt says she is doing well and has no complaints today.    She is not having any cp, sob, dizziness.     Relevant past medical, surgical, family and social history reviewed and updated as indicated. Interim medical history since our last visit reviewed. Allergies and medications reviewed and updated.   Current Outpatient Medications:  .  amLODipine (NORVASC) 10 MG tablet, TAKE 1 Tablet BY MOUTH ONCE EVERY DAY, Disp: 90 tablet, Rfl: 0 .  aspirin 81 MG chewable tablet, Chew 81 mg by mouth daily., Disp: , Rfl:  .  atorvastatin (LIPITOR) 20 MG tablet, TAKE 1 Tablet BY MOUTH ONCE DAILY, Disp: 90 tablet, Rfl: 0 .  Cholecalciferol (VITAMIN D3 PO), Take 1 tablet by mouth daily., Disp: , Rfl:  .  losartan (COZAAR) 100 MG tablet, TAKE 1 Tablet BY MOUTH ONCE DAILY, Disp: 90 tablet, Rfl: 0 .  metoprolol tartrate (LOPRESSOR) 25 MG tablet, Take 1 tablet (25 mg total) by mouth 2 (two) times daily., Disp: 180 tablet, Rfl: 3 .  Omega-3 Fatty Acids (OMEGA 3 PO), Take 1 capsule by mouth daily., Disp: , Rfl:  .  Riboflavin (VITAMIN B-2 PO), Take 1 tablet by mouth daily., Disp: , Rfl:  .  furosemide (LASIX) 20 MG tablet, Take 1 tablet (20 mg total) by mouth daily., Disp: 90 tablet, Rfl: 3    Review of Systems  Per HPI unless specifically indicated above     Objective:    BP 107/64   Pulse 65   Temp 97.6 F (36.4 C)   Wt 229 lb (103.9 kg)   SpO2 96%   BMI 41.88 kg/m   Wt Readings from Last 3 Encounters:  02/15/21 229 lb (103.9 kg)  11/16/20 226 lb 4 oz  (102.6 kg)  09/03/20 231 lb (104.8 kg)    Physical Exam Vitals reviewed.  Constitutional:      General: She is not in acute distress.    Appearance: She is well-developed. She is not toxic-appearing.  HENT:     Head: Normocephalic and atraumatic.  Cardiovascular:     Rate and Rhythm: Normal rate and regular rhythm.     Heart sounds: Murmur heard.    Pulmonary:     Effort: Pulmonary effort is normal.     Breath sounds: Normal breath sounds.  Abdominal:     General: Bowel sounds are normal.     Palpations: Abdomen is soft. There is no mass.     Tenderness: There is no abdominal tenderness.  Musculoskeletal:     Cervical back: Neck supple.     Right lower leg: No edema.     Left lower leg: No edema.  Lymphadenopathy:     Cervical: No cervical adenopathy.  Skin:    General: Skin is warm and dry.  Neurological:     Mental Status: She is alert and oriented to person, place, and time.  Psychiatric:  Mood and Affect: Mood normal.        Behavior: Behavior normal.     Results for orders placed or performed during the hospital encounter of 02/12/21  Hemoglobin A1c  Result Value Ref Range   Hgb A1c MFr Bld 6.3 (H) 4.8 - 5.6 %   Mean Plasma Glucose 134.11 mg/dL  Lipid panel  Result Value Ref Range   Cholesterol 122 0 - 200 mg/dL   Triglycerides 789 <381 mg/dL   HDL 48 >01 mg/dL   Total CHOL/HDL Ratio 2.5 RATIO   VLDL 26 0 - 40 mg/dL   LDL Cholesterol 48 0 - 99 mg/dL  Comprehensive metabolic panel  Result Value Ref Range   Sodium 141 135 - 145 mmol/L   Potassium 4.2 3.5 - 5.1 mmol/L   Chloride 104 98 - 111 mmol/L   CO2 27 22 - 32 mmol/L   Glucose, Bld 114 (H) 70 - 99 mg/dL   BUN 18 8 - 23 mg/dL   Creatinine, Ser 7.51 (H) 0.44 - 1.00 mg/dL   Calcium 02.5 8.9 - 85.2 mg/dL   Total Protein 7.4 6.5 - 8.1 g/dL   Albumin 4.3 3.5 - 5.0 g/dL   AST 23 15 - 41 U/L   ALT 18 0 - 44 U/L   Alkaline Phosphatase 69 38 - 126 U/L   Total Bilirubin 1.6 (H) 0.3 - 1.2 mg/dL    GFR, Estimated 50 (L) >60 mL/min   Anion gap 10 5 - 15      Assessment & Plan:     Encounter Diagnoses  Name Primary?  . HTN (hypertension), malignant Yes  . Hyperlipidemia, unspecified hyperlipidemia type   . Prediabetes   . Chronic kidney disease, unspecified CKD stage   . Morbid obesity (HCC)      -Reviewed labs with pt -counsled on prediabetic diet and limiting sweets and eating well-balanced diet with carbohydrates only being a portion of her meals -pt to continue her current medications. Will continue to monitor renal function -pt is encouraged to exercise/walk regularly -pt to follow up  95months.  She is to contact office sooner prn

## 2021-02-15 NOTE — Patient Instructions (Signed)
Prediabetes Eating Plan Prediabetes is a condition that causes blood sugar (glucose) levels to be higher than normal. This increases the risk for developing type 2 diabetes (type 2 diabetes mellitus). Working with a health care provider or nutrition specialist (dietitian) to make diet and lifestyle changes can help prevent the onset of diabetes. These changes may help you:  Control your blood glucose levels.  Improve your cholesterol levels.  Manage your blood pressure. What are tips for following this plan? Reading food labels  Read food labels to check the amount of fat, salt (sodium), and sugar in prepackaged foods. Avoid foods that have: ? Saturated fats. ? Trans fats. ? Added sugars.  Avoid foods that have more than 300 milligrams (mg) of sodium per serving. Limit your sodium intake to less than 2,300 mg each day. Shopping  Avoid buying pre-made and processed foods.  Avoid buying drinks with added sugar. Cooking  Cook with olive oil. Do not use butter, lard, or ghee.  Bake, broil, grill, steam, or boil foods. Avoid frying. Meal planning  Work with your dietitian to create an eating plan that is right for you. This may include tracking how many calories you take in each day. Use a food diary, notebook, or mobile application to track what you eat at each meal.  Consider following a Mediterranean diet. This includes: ? Eating several servings of fresh fruits and vegetables each day. ? Eating fish at least twice a week. ? Eating one serving each day of whole grains, beans, nuts, and seeds. ? Using olive oil instead of other fats. ? Limiting alcohol. ? Limiting red meat. ? Using nonfat or low-fat dairy products.  Consider following a plant-based diet. This includes dietary choices that focus on eating mostly vegetables and fruit, grains, beans, nuts, and seeds.  If you have high blood pressure, you may need to limit your sodium intake or follow a diet such as the DASH  (Dietary Approaches to Stop Hypertension) eating plan. The DASH diet aims to lower high blood pressure.   Lifestyle  Set weight loss goals with help from your health care team. It is recommended that most people with prediabetes lose 7% of their body weight.  Exercise for at least 30 minutes 5 or more days a week.  Attend a support group or seek support from a mental health counselor.  Take over-the-counter and prescription medicines only as told by your health care provider. What foods are recommended? Fruits Berries. Bananas. Apples. Oranges. Grapes. Papaya. Mango. Pomegranate. Kiwi. Grapefruit. Cherries. Vegetables Lettuce. Spinach. Peas. Beets. Cauliflower. Cabbage. Broccoli. Carrots. Tomatoes. Squash. Eggplant. Herbs. Peppers. Onions. Cucumbers. Brussels sprouts. Grains Whole grains, such as whole-wheat or whole-grain breads, crackers, cereals, and pasta. Unsweetened oatmeal. Bulgur. Barley. Quinoa. Brown rice. Corn or whole-wheat flour tortillas or taco shells. Meats and other proteins Seafood. Poultry without skin. Lean cuts of pork and beef. Tofu. Eggs. Nuts. Beans. Dairy Low-fat or fat-free dairy products, such as yogurt, cottage cheese, and cheese. Beverages Water. Tea. Coffee. Sugar-free or diet soda. Seltzer water. Low-fat or nonfat milk. Milk alternatives, such as soy or almond milk. Fats and oils Olive oil. Canola oil. Sunflower oil. Grapeseed oil. Avocado. Walnuts. Sweets and desserts Sugar-free or low-fat pudding. Sugar-free or low-fat ice cream and other frozen treats. Seasonings and condiments Herbs. Sodium-free spices. Mustard. Relish. Low-salt, low-sugar ketchup. Low-salt, low-sugar barbecue sauce. Low-fat or fat-free mayonnaise. The items listed above may not be a complete list of recommended foods and beverages. Contact a dietitian for more   information. What foods are not recommended? Fruits Fruits canned with syrup. Vegetables Canned vegetables. Frozen  vegetables with butter or cream sauce. Grains Refined white flour and flour products, such as bread, pasta, snack foods, and cereals. Meats and other proteins Fatty cuts of meat. Poultry with skin. Breaded or fried meat. Processed meats. Dairy Full-fat yogurt, cheese, or milk. Beverages Sweetened drinks, such as iced tea and soda. Fats and oils Butter. Lard. Ghee. Sweets and desserts Baked goods, such as cake, cupcakes, pastries, cookies, and cheesecake. Seasonings and condiments Spice mixes with added salt. Ketchup. Barbecue sauce. Mayonnaise. The items listed above may not be a complete list of foods and beverages that are not recommended. Contact a dietitian for more information. Where to find more information  American Diabetes Association: www.diabetes.org Summary  You may need to make diet and lifestyle changes to help prevent the onset of diabetes. These changes can help you control blood sugar, improve cholesterol levels, and manage blood pressure.  Set weight loss goals with help from your health care team. It is recommended that most people with prediabetes lose 7% of their body weight.  Consider following a Mediterranean diet. This includes eating plenty of fresh fruits and vegetables, whole grains, beans, nuts, seeds, fish, and low-fat dairy, and using olive oil instead of other fats. This information is not intended to replace advice given to you by your health care provider. Make sure you discuss any questions you have with your health care provider. Document Revised: 01/23/2020 Document Reviewed: 01/23/2020 Elsevier Patient Education  2021 Elsevier Inc.  

## 2021-05-12 ENCOUNTER — Other Ambulatory Visit: Payer: Self-pay | Admitting: Physician Assistant

## 2021-05-12 DIAGNOSIS — E785 Hyperlipidemia, unspecified: Secondary | ICD-10-CM

## 2021-05-12 DIAGNOSIS — I10 Essential (primary) hypertension: Secondary | ICD-10-CM

## 2021-05-17 ENCOUNTER — Ambulatory Visit: Payer: Medicaid Other | Admitting: Physician Assistant

## 2021-05-21 ENCOUNTER — Emergency Department (HOSPITAL_COMMUNITY): Payer: Self-pay

## 2021-05-21 ENCOUNTER — Observation Stay (HOSPITAL_COMMUNITY): Payer: Self-pay

## 2021-05-21 ENCOUNTER — Other Ambulatory Visit: Payer: Self-pay

## 2021-05-21 ENCOUNTER — Encounter (HOSPITAL_COMMUNITY): Payer: Self-pay | Admitting: Emergency Medicine

## 2021-05-21 ENCOUNTER — Inpatient Hospital Stay (HOSPITAL_COMMUNITY)
Admission: EM | Admit: 2021-05-21 | Discharge: 2021-05-23 | DRG: 291 | Disposition: A | Discharge status: Home / Self Care | Payer: Self-pay | Attending: Family Medicine | Admitting: Family Medicine

## 2021-05-21 DIAGNOSIS — N1832 Chronic kidney disease, stage 3b: Secondary | ICD-10-CM | POA: Diagnosis present

## 2021-05-21 DIAGNOSIS — I509 Heart failure, unspecified: Secondary | ICD-10-CM

## 2021-05-21 DIAGNOSIS — Z79899 Other long term (current) drug therapy: Secondary | ICD-10-CM

## 2021-05-21 DIAGNOSIS — E876 Hypokalemia: Secondary | ICD-10-CM | POA: Diagnosis present

## 2021-05-21 DIAGNOSIS — Z833 Family history of diabetes mellitus: Secondary | ICD-10-CM

## 2021-05-21 DIAGNOSIS — E669 Obesity, unspecified: Secondary | ICD-10-CM | POA: Diagnosis present

## 2021-05-21 DIAGNOSIS — I130 Hypertensive heart and chronic kidney disease with heart failure and stage 1 through stage 4 chronic kidney disease, or unspecified chronic kidney disease: Principal | ICD-10-CM | POA: Diagnosis present

## 2021-05-21 DIAGNOSIS — R079 Chest pain, unspecified: Secondary | ICD-10-CM

## 2021-05-21 DIAGNOSIS — R9431 Abnormal electrocardiogram [ECG] [EKG]: Secondary | ICD-10-CM

## 2021-05-21 DIAGNOSIS — Z8249 Family history of ischemic heart disease and other diseases of the circulatory system: Secondary | ICD-10-CM

## 2021-05-21 DIAGNOSIS — I2722 Pulmonary hypertension due to left heart disease: Secondary | ICD-10-CM | POA: Diagnosis present

## 2021-05-21 DIAGNOSIS — I10 Essential (primary) hypertension: Secondary | ICD-10-CM | POA: Diagnosis present

## 2021-05-21 DIAGNOSIS — N179 Acute kidney failure, unspecified: Secondary | ICD-10-CM | POA: Diagnosis present

## 2021-05-21 DIAGNOSIS — I517 Cardiomegaly: Secondary | ICD-10-CM

## 2021-05-21 DIAGNOSIS — E785 Hyperlipidemia, unspecified: Secondary | ICD-10-CM | POA: Diagnosis present

## 2021-05-21 DIAGNOSIS — I4891 Unspecified atrial fibrillation: Secondary | ICD-10-CM

## 2021-05-21 DIAGNOSIS — E1122 Type 2 diabetes mellitus with diabetic chronic kidney disease: Secondary | ICD-10-CM | POA: Diagnosis present

## 2021-05-21 DIAGNOSIS — J9601 Acute respiratory failure with hypoxia: Secondary | ICD-10-CM | POA: Diagnosis present

## 2021-05-21 DIAGNOSIS — I313 Pericardial effusion (noninflammatory): Secondary | ICD-10-CM | POA: Diagnosis present

## 2021-05-21 DIAGNOSIS — Z87891 Personal history of nicotine dependence: Secondary | ICD-10-CM

## 2021-05-21 DIAGNOSIS — Z6841 Body Mass Index (BMI) 40.0 and over, adult: Secondary | ICD-10-CM

## 2021-05-21 DIAGNOSIS — I480 Paroxysmal atrial fibrillation: Secondary | ICD-10-CM | POA: Diagnosis present

## 2021-05-21 DIAGNOSIS — I2699 Other pulmonary embolism without acute cor pulmonale: Secondary | ICD-10-CM

## 2021-05-21 DIAGNOSIS — Z7982 Long term (current) use of aspirin: Secondary | ICD-10-CM

## 2021-05-21 DIAGNOSIS — R0600 Dyspnea, unspecified: Secondary | ICD-10-CM | POA: Diagnosis present

## 2021-05-21 DIAGNOSIS — R0609 Other forms of dyspnea: Secondary | ICD-10-CM

## 2021-05-21 DIAGNOSIS — Z20822 Contact with and (suspected) exposure to covid-19: Secondary | ICD-10-CM | POA: Diagnosis present

## 2021-05-21 DIAGNOSIS — I5033 Acute on chronic diastolic (congestive) heart failure: Secondary | ICD-10-CM | POA: Diagnosis present

## 2021-05-21 LAB — COMPREHENSIVE METABOLIC PANEL
ALT: 20 U/L (ref 0–44)
AST: 20 U/L (ref 15–41)
Albumin: 4.3 g/dL (ref 3.5–5.0)
Alkaline Phosphatase: 67 U/L (ref 38–126)
Anion gap: 9 (ref 5–15)
BUN: 24 mg/dL — ABNORMAL HIGH (ref 8–23)
CO2: 29 mmol/L (ref 22–32)
Calcium: 9.3 mg/dL (ref 8.9–10.3)
Chloride: 102 mmol/L (ref 98–111)
Creatinine, Ser: 1.56 mg/dL — ABNORMAL HIGH (ref 0.44–1.00)
GFR, Estimated: 37 mL/min — ABNORMAL LOW (ref >60)
Glucose, Bld: 112 mg/dL — ABNORMAL HIGH (ref 70–99)
Potassium: 3.4 mmol/L — ABNORMAL LOW (ref 3.5–5.1)
Sodium: 140 mmol/L (ref 135–145)
Total Bilirubin: 1.7 mg/dL — ABNORMAL HIGH (ref 0.3–1.2)
Total Protein: 7.1 g/dL (ref 6.5–8.1)

## 2021-05-21 LAB — CBC WITH DIFFERENTIAL/PLATELET
Abs Immature Granulocytes: 0.01 10*3/uL (ref 0.00–0.07)
Basophils Absolute: 0.1 10*3/uL (ref 0.0–0.1)
Basophils Relative: 1 %
Eosinophils Absolute: 0.2 10*3/uL (ref 0.0–0.5)
Eosinophils Relative: 3 %
HCT: 43.3 % (ref 36.0–46.0)
Hemoglobin: 13.9 g/dL (ref 12.0–15.0)
Immature Granulocytes: 0 %
Lymphocytes Relative: 29 %
Lymphs Abs: 1.6 10*3/uL (ref 0.7–4.0)
MCH: 31.5 pg (ref 26.0–34.0)
MCHC: 32.1 g/dL (ref 30.0–36.0)
MCV: 98.2 fL (ref 80.0–100.0)
Monocytes Absolute: 0.9 10*3/uL (ref 0.1–1.0)
Monocytes Relative: 16 %
Neutro Abs: 2.9 10*3/uL (ref 1.7–7.7)
Neutrophils Relative %: 51 %
Platelets: 254 10*3/uL (ref 150–400)
RBC: 4.41 MIL/uL (ref 3.87–5.11)
RDW: 15.9 % — ABNORMAL HIGH (ref 11.5–15.5)
WBC: 5.7 10*3/uL (ref 4.0–10.5)
nRBC: 0 % (ref 0.0–0.2)

## 2021-05-21 LAB — ECHOCARDIOGRAM COMPLETE
AR max vel: 1.82 cm2
AV Area VTI: 1.77 cm2
AV Area mean vel: 2.03 cm2
AV Mean grad: 4 mmHg
AV Peak grad: 9.3 mmHg
Ao pk vel: 1.53 m/s
Area-P 1/2: 4.12 cm2
Height: 62 in
S' Lateral: 1.5 cm
Weight: 3664.93 oz

## 2021-05-21 LAB — BRAIN NATRIURETIC PEPTIDE: B Natriuretic Peptide: 489 pg/mL — ABNORMAL HIGH (ref 0.0–100.0)

## 2021-05-21 LAB — MAGNESIUM: Magnesium: 1.9 mg/dL (ref 1.7–2.4)

## 2021-05-21 LAB — TROPONIN I (HIGH SENSITIVITY)
Troponin I (High Sensitivity): 22 ng/L — ABNORMAL HIGH (ref <18)
Troponin I (High Sensitivity): 18 ng/L — ABNORMAL HIGH (ref <18)

## 2021-05-21 LAB — HIV ANTIBODY (ROUTINE TESTING W REFLEX): HIV Screen 4th Generation wRfx: NONREACTIVE

## 2021-05-21 LAB — D-DIMER, QUANTITATIVE: D-Dimer, Quant: 0.4 ug/mL-FEU (ref 0.00–0.50)

## 2021-05-21 LAB — HEPARIN LEVEL (UNFRACTIONATED): Heparin Unfractionated: 0.9 IU/mL — ABNORMAL HIGH (ref 0.30–0.70)

## 2021-05-21 LAB — RESP PANEL BY RT-PCR (FLU A&B, COVID) ARPGX2
Influenza A by PCR: NEGATIVE
Influenza B by PCR: NEGATIVE
SARS Coronavirus 2 by RT PCR: NEGATIVE

## 2021-05-21 MED ORDER — LABETALOL HCL 5 MG/ML IV SOLN: 10.0000 mg | INTRAVENOUS | Status: DC | PRN

## 2021-05-21 MED ORDER — POLYETHYLENE GLYCOL 3350 17 G PO PACK: 17.0000 g | PACK | Freq: Every day | ORAL | Status: DC | PRN

## 2021-05-21 MED ORDER — HEPARIN (PORCINE) 25000 UT/250ML-% IV SOLN
1200.0000 [IU]/h | INTRAVENOUS | Status: AC
  Administered 2021-05-21: 1650 [IU]/h via INTRAVENOUS
  Administered 2021-05-22: 1500 [IU]/h via INTRAVENOUS
  Administered 2021-05-22: 1200 [IU]/h via INTRAVENOUS
  Filled 2021-05-21 (×3): qty 250

## 2021-05-21 MED ORDER — FUROSEMIDE 10 MG/ML IJ SOLN
40.0000 mg | Freq: Two times a day (BID) | INTRAMUSCULAR | Status: DC
  Administered 2021-05-21 – 2021-05-23 (×5): 40 mg via INTRAVENOUS
  Filled 2021-05-21 (×5): qty 4

## 2021-05-21 MED ORDER — HEPARIN SODIUM (PORCINE) 5000 UNIT/ML IJ SOLN: 5000.0000 [IU] | Freq: Three times a day (TID) | INTRAMUSCULAR | Status: DC

## 2021-05-21 MED ORDER — FUROSEMIDE 20 MG PO TABS: 20.0000 mg | ORAL_TABLET | Freq: Every day | ORAL | Status: DC

## 2021-05-21 MED ORDER — BISACODYL 10 MG RE SUPP: 10.0000 mg | Freq: Every day | RECTAL | Status: DC | PRN

## 2021-05-21 MED ORDER — SODIUM CHLORIDE 0.9% FLUSH
3.0000 mL | Freq: Two times a day (BID) | INTRAVENOUS | Status: DC
  Administered 2021-05-22 – 2021-05-23 (×3): 3 mL via INTRAVENOUS

## 2021-05-21 MED ORDER — ONDANSETRON HCL 4 MG/2ML IJ SOLN: 4.0000 mg | Freq: Four times a day (QID) | INTRAMUSCULAR | Status: DC | PRN

## 2021-05-21 MED ORDER — SODIUM CHLORIDE 0.9 % IV SOLN: 250.0000 mL | INTRAVENOUS | Status: DC | PRN

## 2021-05-21 MED ORDER — ALBUTEROL SULFATE (2.5 MG/3ML) 0.083% IN NEBU: 2.5000 mg | INHALATION_SOLUTION | RESPIRATORY_TRACT | Status: DC | PRN

## 2021-05-21 MED ORDER — ASPIRIN 81 MG PO CHEW
324.0000 mg | CHEWABLE_TABLET | Freq: Once | ORAL | Status: AC
  Administered 2021-05-21: 324 mg via ORAL
  Filled 2021-05-21: qty 4

## 2021-05-21 MED ORDER — METOPROLOL TARTRATE 25 MG PO TABS
25.0000 mg | ORAL_TABLET | Freq: Two times a day (BID) | ORAL | Status: DC
  Administered 2021-05-21 – 2021-05-23 (×4): 25 mg via ORAL
  Filled 2021-05-21 (×4): qty 1

## 2021-05-21 MED ORDER — ATORVASTATIN CALCIUM 20 MG PO TABS
20.0000 mg | ORAL_TABLET | Freq: Every evening | ORAL | Status: DC
  Administered 2021-05-21 – 2021-05-22 (×2): 20 mg via ORAL
  Filled 2021-05-21 (×3): qty 1

## 2021-05-21 MED ORDER — POTASSIUM CHLORIDE CRYS ER 20 MEQ PO TBCR
40.0000 meq | EXTENDED_RELEASE_TABLET | Freq: Four times a day (QID) | ORAL | Status: AC
Start: 2021-05-21 — End: 2021-05-21
  Administered 2021-05-21 (×2): 40 meq via ORAL
  Filled 2021-05-21 (×2): qty 2

## 2021-05-21 MED ORDER — TECHNETIUM TO 99M ALBUMIN AGGREGATED
4.0000 | Freq: Once | INTRAVENOUS | Status: AC | PRN
  Administered 2021-05-21: 4.4 via INTRAVENOUS

## 2021-05-21 MED ORDER — SODIUM CHLORIDE 0.9% FLUSH: 3.0000 mL | INTRAVENOUS | Status: DC | PRN

## 2021-05-21 MED ORDER — TRAZODONE HCL 50 MG PO TABS: 50.0000 mg | ORAL_TABLET | Freq: Every evening | ORAL | Status: DC | PRN

## 2021-05-21 MED ORDER — ACETAMINOPHEN 650 MG RE SUPP: 650.0000 mg | Freq: Four times a day (QID) | RECTAL | Status: DC | PRN

## 2021-05-21 MED ORDER — ONDANSETRON HCL 4 MG PO TABS: 4.0000 mg | ORAL_TABLET | Freq: Four times a day (QID) | ORAL | Status: DC | PRN

## 2021-05-21 MED ORDER — ASPIRIN 81 MG PO CHEW
81.0000 mg | CHEWABLE_TABLET | Freq: Every day | ORAL | Status: DC
  Administered 2021-05-22 – 2021-05-23 (×2): 81 mg via ORAL
  Filled 2021-05-21 (×2): qty 1

## 2021-05-21 MED ORDER — SODIUM CHLORIDE 0.9% FLUSH
3.0000 mL | Freq: Two times a day (BID) | INTRAVENOUS | Status: DC
  Administered 2021-05-22 – 2021-05-23 (×2): 3 mL via INTRAVENOUS

## 2021-05-21 MED ORDER — ACETAMINOPHEN 325 MG PO TABS: 650.0000 mg | ORAL_TABLET | Freq: Four times a day (QID) | ORAL | Status: DC | PRN

## 2021-05-21 MED ORDER — HEPARIN BOLUS VIA INFUSION
4000.0000 [IU] | Freq: Once | INTRAVENOUS | Status: AC
  Administered 2021-05-21: 4000 [IU] via INTRAVENOUS

## 2021-05-21 MED ORDER — AMLODIPINE BESYLATE 5 MG PO TABS
10.0000 mg | ORAL_TABLET | Freq: Every day | ORAL | Status: DC
  Administered 2021-05-22: 10 mg via ORAL
  Filled 2021-05-21 (×2): qty 2

## 2021-05-21 MED ORDER — POTASSIUM CHLORIDE CRYS ER 20 MEQ PO TBCR: 40.0000 meq | EXTENDED_RELEASE_TABLET | Freq: Once | ORAL | Status: DC

## 2021-05-21 NOTE — Progress Notes (Signed)
ANTICOAGULATION CONSULT NOTE - Follow Up Consult  Pharmacy Consult for heparin Indication: atrial fibrillation  No Known Allergies  Patient Measurements: Height: 5\' 4"  (162.6 cm) Weight: 101.7 kg (224 lb 3.3 oz) IBW/kg (Calculated) : 54.7 Heparin Dosing Weight: 75 kg  Vital Signs: Temp: 98.3 F (36.8 C) (07/15 1748) Temp Source: Oral (07/15 1748) BP: 121/72 (07/15 1748) Pulse Rate: 77 (07/15 1748)  Labs: Recent Labs    05/21/21 0944 05/21/21 1054 05/21/21 1904  HGB 13.9  --   --   HCT 43.3  --   --   PLT 254  --   --   HEPARINUNFRC  --   --  0.90*  CREATININE 1.56*  --   --   TROPONINIHS 22* 18*  --     Estimated Creatinine Clearance: 42.8 mL/min (A) (by C-G formula based on SCr of 1.56 mg/dL (H)).   Medications:  Scheduled:   amLODipine  10 mg Oral Daily   aspirin  81 mg Oral Daily   atorvastatin  20 mg Oral QPM   furosemide  40 mg Intravenous BID   metoprolol tartrate  25 mg Oral BID   sodium chloride flush  3 mL Intravenous Q12H   sodium chloride flush  3 mL Intravenous Q12H   Infusions:   sodium chloride     heparin 1,650 Units/hr (05/21/21 1353)    Assessment: Tammy Small I a 65 YO female requiring heparin IV infusion for atrial fibrillation. Heparin level is 0.9 units/ml which is above goal level of 0.3-0.7 units/ml.   Goal of Therapy:  Heparin level 0.3-0.7 units/ml Monitor platelets by anticoagulation protocol: Yes   Plan:  Decrease heparin infusion rate by 2 units/kg/hr (150 units/hr) for a new rate of 1500 units/hr. Check anti-Xa level in 6 hours and daily while on heparin  65 Tammy Small 05/21/2021,8:10 PM

## 2021-05-21 NOTE — H&P (Signed)
Patient Demographics:    Tammy Small, is a 65 y.o. female  MRN: [id number]   DOB - [birth year]  Admit Date - 05/21/2021  Outpatient Primary MD for the patient is Jacquelin Hawking, PA-C   Assessment & Plan:    Principal Problem:   Acute respiratory failure with hypoxia [facility]) Active Problems:   Dyspnea and respiratory abnormalities   Paroxysmal A-fib (HCC)   HTN (hypertension)   Obesity (BMI 30-39.9)   1)Acute hypoxic respiratory failure--- suspect secondary to fluid overload/diastolic CHF in the setting of severe pulmonary hypertension and moderate pericardial effusion -Echo from 05/21/2021 with EF of 70 to 75%, no regional wall motion abnormalities, severely elevated pulmonary artery systolic pressure noted -Echo also confirms moderate pericardial effusion without tamponade --Chest x-ray with cardiomegaly otherwise no acute findings -Troponin is 22, repeat troponin is 18 -BNP elevated at 489 , no prior available for comparison -IV Lasix as ordered -Given hypoxia, chest discomfort and elevated pulmonary artery pressures PE rule out done with  VQ scan and lower extremity Dopplers which are negative -Hypoxia improving with diuresis--- trying to wean off oxygen  2)PAFiB--status post prior ablation in [place], unsuccessful -Cardiology consult appreciated -Continue metoprolol for rate control -CHA2DS2-VASc score is 4 -IV heparin for stroke prophylaxis, with plans to transition to Eliquis upon discharge  3)HTN--continue amlodipine, metoprolol and Lasix -Hold losartan  4)Morbid Obesity-  -Low calorie diet, portion control and increase physical activity discussed with patient -Body mass index is 38.49 kg/m.  5)Hypokalemia-replace and monitor closely especially with IV Lasix use  6)AKI----acute kidney injury  on CKD stage - 3A  Creatinine is  1.56--Baseline creatinine appears to be around 1.2  renally adjust medications, avoid nephrotoxic agents / dehydration  / hypotension   Disposition/Need for in-Hospital Stay- patient unable to be discharged at this time due to hypoxia with CHF exacerbation requiring IV diuresis in the setting of AKI and hypokalemia requiring monitoring of renal parameters and electrolytes  Status is: Inpatient  Remains inpatient appropriate because: Please see above  Dispo: The patient is from: Home              Anticipated d/c is to: Home              Anticipated d/c date is: 2 days              Patient currently is not medically stable to d/c. Barriers: Not Clinically Stable-   With History of - Reviewed by me  Past Medical History:  Diagnosis Date   Essential hypertension    Hyperlipidemia    Type 2 diabetes mellitus (HCC)       Past Surgical History:  Procedure Laterality Date   CESAREAN SECTION      Chief Complaint  Patient presents with   Chest Pain      HPI:    Tammy Small  is a 65 y.o. female who is a reformed smoker with a hx of HTN,  hyperlipidemia, DM2, chronic diastolic HF, CKD III, moderate pericardial effusion, obesity, paroxysmal A. fib with prior ablation back in [place]- Complains of increased lower extremity edema, chest discomfort and dyspnea especially with activity -Patient apparently previously was on Xarelto but has not been on Xarelto lately----symptoms started about 5 days ago initially when she was trying to move some furniture No fever  Or chills   No Nausea, Vomiting or Diarrhea -Cardiology consult appreciated recommends IV heparin and IV diuresis -Chest x-ray with cardiomegaly otherwise no acute findings -Troponin is 22, repeat troponin is 18 -BNP elevated at 489 , no prior available for comparison -CBC WNL -Potassium is 3.4 creatinine 1.56--Baseline creatinine appears to be around 1.2    Review of systems:    In  addition to the HPI above,   A full Review of  Systems was done, all other systems reviewed are negative except as noted above in HPI , .    Social History:  Reviewed by me    Social History   Tobacco Use   Smoking status: Former    Packs/day: 0.50    Years: 3.00    Pack years: 1.50    Types: Cigarettes    Quit date: 11/07/1972    Years since quitting: 48.5   Smokeless tobacco: Never  Substance Use Topics   Alcohol use: Not Currently    Comment: last use 1974       Family History :  Reviewed by me    Family History  Problem Relation Age of Onset   Hypertension Father    Diabetes Father     Home Medications:   Prior to Admission medications   Medication Sig Start Date End Date Taking? Authorizing Provider  amLODipine (NORVASC) 10 MG tablet TAKE 1 Tablet BY MOUTH ONCE EVERY DAY Patient taking differently: Take 10 mg by mouth daily. 01/04/21  Yes Jacquelin Hawking, PA-C  aspirin 81 MG chewable tablet Chew 81 mg by mouth daily.   Yes [provider]  atorvastatin (LIPITOR) 20 MG tablet TAKE 1 Tablet BY MOUTH ONCE DAILY Patient taking differently: Take 20 mg by mouth daily. 01/04/21  Yes Jacquelin Hawking, PA-C  Cholecalciferol (VITAMIN D3 PO) Take 1 tablet by mouth daily.   Yes [provider]  Cyanocobalamin (B-12 PO) Take 1 tablet by mouth daily.   Yes [provider]  furosemide (LASIX) 20 MG tablet Take 1 tablet (20 mg total) by mouth daily. 07/10/20 05/21/21 Yes Jonelle Sidle, MD  losartan (COZAAR) 100 MG tablet TAKE 1 Tablet BY MOUTH ONCE DAILY Patient taking differently: Take 100 mg by mouth daily. 01/04/21  Yes Jacquelin Hawking, PA-C  metoprolol tartrate (LOPRESSOR) 25 MG tablet Take 1 tablet (25 mg total) by mouth 2 (two) times daily. 07/10/20 05/21/21 Yes Jonelle Sidle, MD  Omega-3 Fatty Acids (OMEGA 3 PO) Take 1 capsule by mouth daily.   Yes [provider]  Riboflavin (VITAMIN B-2 PO) Take 1 tablet by mouth daily.   Yes  [provider]     Allergies:    No Known Allergies   Physical Exam:   Vitals  Blood pressure 121/72, pulse 77, temperature 98.3 F (36.8 C), temperature source Oral, resp. rate 16, height  (1.626 m), weight 101.7 kg, SpO2 95 %.  Physical Examination: General appearance - alert, obese appearing, and in no distress  Mental status - alert, oriented to person, place, and time,  Eyes - sclera anicteric Nose- [place] 2L/min Neck - supple, no JVD elevation ,  Chest -diminished breath sounds with faint rales in bases  heart - S1 and S2 normal, irregularly irregular  abdomen - soft, nontender, nondistended, increased truncal adiposity Neurological - screening mental status exam normal, neck supple without rigidity, cranial nerves II through XII intact, DTR's normal and symmetric Extremities -chronic venous stasis/pedal edema noted, intact peripheral pulses  Skin - warm, dry     Data Review:    CBC Recent Labs  Lab 05/21/21 0944  WBC 5.7  HGB 13.9  HCT 43.3  PLT 254  MCV 98.2  MCH 31.5  MCHC 32.1  RDW 15.9*  LYMPHSABS 1.6  MONOABS 0.9  EOSABS 0.2  BASOSABS 0.1    Chemistries  Recent Labs  Lab 05/21/21 0944 05/21/21 1046  NA 140  --   K 3.4*  --   CL 102  --   CO2 29  --   GLUCOSE 112*  --   BUN 24*  --   CREATININE 1.56*  --   CALCIUM 9.3  --   MG  --  1.9  AST 20  --   ALT 20  --   ALKPHOS 67  --   BILITOT 1.7*  --    ------------------------------------------------------------------------------------------------------------------ estimated creatinine clearance is 42.8 mL/min (A) (by C-G formula based on SCr of 1.56 mg/dL (H)). ------------------------------------------------------------------------------------------------------------------ No results for input(s): TSH, T4TOTAL, T3FREE, THYROIDAB in the last 72 hours.  Invalid input(s): FREET3   Coagulation profile No results for input(s): INR, PROTIME in the last 168  hours. ------------------------------------------------------------------------------------------------------------------- Recent Labs    05/21/21 0944  DDIMER 0.40   -------------------------------------------------------------------------------------------------------------------  Cardiac Enzymes No results for input(s): CKMB, TROPONINI, MYOGLOBIN in the last 168 hours.  Invalid input(s): CK ------------------------------------------------------------------------------------------------------------------    Component Value Date/Time   BNP 489.0 (H) 05/21/2021 0945     ---------------------------------------------------------------------------------------------------------------  Urinalysis    Component Value Date/Time   BILIRUBINUR MODERATE 06/25/2020 1335   PROTEINUR Positive (A) 06/25/2020 1335   UROBILINOGEN 1.0 06/25/2020 1335   NITRITE NEG 06/25/2020 1335   LEUKOCYTESUR Trace (A) 06/25/2020 1335    ----------------------------------------------------------------------------------------------------------------   Imaging Results:    NM Pulmonary Perfusion  Result Date: 05/21/2021 CLINICAL DATA:  Chest pain and shortness of breath. EXAM: NUCLEAR MEDICINE PERFUSION LUNG SCAN TECHNIQUE: Perfusion images were obtained in multiple projections after intravenous injection of radiopharmaceutical. Ventilation scans intentionally deferred if perfusion scan and chest x-ray adequate for interpretation during COVID 19 epidemic. RADIOPHARMACEUTICALS:  4.4 mCi Tc-64m MAA IV COMPARISON:  Single-view of the chest today. FINDINGS: Perfusion is normal without segmental or subsegmental defect. IMPRESSION: Negative for pulmonary embolus. Electronically Signed   By: Drusilla Kanner M.D.   On: 05/21/2021 13:28   US Venous Img Lower Bilateral (DVT)  Result Date: 05/21/2021 CLINICAL DATA:  Bilateral lower extremity edema EXAM: BILATERAL LOWER EXTREMITY VENOUS DOPPLER ULTRASOUND TECHNIQUE:  Gray-scale sonography with compression, as well as color and duplex ultrasound, were performed to evaluate the deep venous system(s) from the level of the common femoral vein through the popliteal and proximal calf veins. COMPARISON:  None. FINDINGS: VENOUS Normal compressibility of the common femoral, superficial femoral, and popliteal veins, as well as the visualized calf veins. Visualized portions of profunda femoral vein and great saphenous vein unremarkable. No filling defects to suggest DVT on grayscale or color Doppler imaging. Doppler waveforms show normal direction of venous flow, normal respiratory plasticity and response to augmentation. Limited views of the contralateral common femoral vein are unremarkable. OTHER None. Limitations: none IMPRESSION: No lower extremity DVT Electronically Signed  By: Mauri ReadingFarhaan  Mir M.D.   On: 05/21/2021 12:39   DG Chest Port 1 View  Result Date: 05/21/2021 CLINICAL DATA:  65 year old female with right side chest pain radiating to the back. Shortness of breath. EXAM: PORTABLE CHEST 1 VIEW COMPARISON:  None. FINDINGS: Portable AP semi upright view at 1004 hours. Cardiomegaly. Other mediastinal contours are within normal limits. Hypoventilation at the left lung base appears most likely due to atelectasis. Elsewhere when allowing for portable technique the lungs are clear. No pneumothorax. No acute osseous abnormality identified. Paucity of bowel gas in the upper abdomen. IMPRESSION: Cardiomegaly with associated lung base atelectasis. Electronically Signed   By: Odessa FlemingH  Hall M.D.   On: 05/21/2021 10:34   ECHOCARDIOGRAM COMPLETE  Result Date: 05/21/2021    ECHOCARDIOGRAM REPORT   Patient Name:   Tammy ArmsFRANCES Small Date of Exam: 05/21/2021 Medical Rec #:  [id number]    Height:       62.0 in Accession #:    [id number]   Weight:       229.1 lb Date of Birth:  June 05, 1956   BSA:          2.025 m Patient Age:    64 years     BP:           136/91 mmHg Patient Gender: F            HR:            74 bpm. [facility] Procedure: 2D Echo, Cardiac Doppler and Color Doppler Indications:    Abnormal ECG R94.31  History:        Patient has prior history of Echocardiogram examinations, most                 recent 09/08/2020. Arrythmias:Atrial Fibrillation; Risk                 Factors:Dyslipidemia, Diabetes, Hypertension and Former Smoker.                 Hx of pericardial effusion, chronic kidney disease stage IIIb.  Sonographer:    Celesta GentileBernard White RCS Referring Phys: [phone] BRIAN MILLER IMPRESSIONS  1. Left ventricular ejection fraction, by estimation, is 70 to 75%. The left ventricle has hyperdynamic function. The left ventricle has no regional wall motion abnormalities. There is moderate left ventricular hypertrophy. Left ventricular diastolic parameters are indeterminate.  2. Right ventricular systolic function is low normal. The right ventricular size is mildly enlarged. There is severely elevated pulmonary artery systolic pressure.  3. Left atrial size was mild to moderately dilated.  4. Right atrial size was mildly dilated.  5. Moderate pericardial effusion. The pericardial effusion is circumferential. There is no evidence of cardiac tamponade.  6. The mitral valve is normal in structure. Trivial mitral valve regurgitation. No evidence of mitral stenosis.  7. The aortic valve is tricuspid. There is mild calcification of the aortic valve. There is mild thickening of the aortic valve. Aortic valve regurgitation is not visualized. No aortic stenosis is present.  8. The inferior vena cava is dilated in size with >50% respiratory variability, suggesting right atrial pressure of 8 mmHg. FINDINGS  Left Ventricle: Left ventricular ejection fraction, by estimation, is 70 to 75%. The left ventricle has hyperdynamic function. The left ventricle has no regional wall motion abnormalities. The left ventricular internal cavity size was normal in size. There is moderate left ventricular hypertrophy. Left  ventricular diastolic parameters are indeterminate. Right Ventricle: The right ventricular size is  mildly enlarged. Right vetricular wall thickness was not assessed. Right ventricular systolic function is low normal. There is severely elevated pulmonary artery systolic pressure. The tricuspid regurgitant velocity is 3.98 m/s, and with an assumed right atrial pressure of 8 mmHg, the estimated right ventricular systolic pressure is 71.4 mmHg. Left Atrium: Left atrial size was mild to moderately dilated. Right Atrium: Right atrial size was mildly dilated. Pericardium: A moderately sized pericardial effusion is present. The pericardial effusion is circumferential. There is no evidence of cardiac tamponade. Mitral Valve: The mitral valve is normal in structure. There is mild thickening of the mitral valve leaflet(s). There is mild calcification of the mitral valve leaflet(s). Mild mitral annular calcification. Trivial mitral valve regurgitation. No evidence  of mitral valve stenosis. Tricuspid Valve: The tricuspid valve is normal in structure. Tricuspid valve regurgitation is trivial. No evidence of tricuspid stenosis. Aortic Valve: The aortic valve is tricuspid. There is mild calcification of the aortic valve. There is mild thickening of the aortic valve. There is mild aortic valve annular calcification. Aortic valve regurgitation is not visualized. No aortic stenosis  is present. Aortic valve mean gradient measures 4.0 mmHg. Aortic valve peak gradient measures 9.3 mmHg. Aortic valve area, by VTI measures 1.77 cm. Pulmonic Valve: The pulmonic valve was not well visualized. Pulmonic valve regurgitation is mild. No evidence of pulmonic stenosis. Aorta: The aortic root is normal in size and structure. Venous: The inferior vena cava is dilated in size with greater than 50% respiratory variability, suggesting right atrial pressure of 8 mmHg. IAS/Shunts: No atrial level shunt detected by color flow Doppler.  LEFT VENTRICLE  PLAX 2D LVIDd:         3.70 cm LVIDs:         1.50 cm LV PW:         1.40 cm LV IVS:        1.40 cm LVOT diam:     1.80 cm LV SV:         51 LV SV Index:   25 LVOT Area:     2.54 cm  RIGHT VENTRICLE TAPSE (M-mode): 1.7 cm LEFT ATRIUM              Index       RIGHT ATRIUM           Index LA diam:        4.70 cm  2.32 cm/m  RA Area:     29.90 cm LA Vol (A2C):   102.0 ml 50.37 ml/m RA Volume:   95.80 ml  47.31 ml/m LA Vol (A4C):   81.1 ml  40.05 ml/m LA Biplane Vol: 92.8 ml  45.82 ml/m  AORTIC VALVE AV Area (Vmax):    1.82 cm AV Area (Vmean):   2.03 cm AV Area (VTI):     1.77 cm AV Vmax:           152.50 cm/s AV Vmean:          93.700 cm/s AV VTI:            0.290 m AV Peak Grad:      9.3 mmHg AV Mean Grad:      4.0 mmHg LVOT Vmax:         108.83 cm/s LVOT Vmean:        74.633 cm/s LVOT VTI:          0.202 m LVOT/AV VTI ratio: 0.70  AORTA Ao Root diam: 3.50 cm MITRAL VALVE  TRICUSPID VALVE MV Area (PHT): 4.12 cm     TR Peak grad:   63.4 mmHg MV Decel Time: 184 msec     TR Vmax:        398.00 cm/s MV E velocity: 122.00 cm/s                             SHUNTS                             Systemic VTI:  0.20 m                             Systemic Diam: 1.80 cm Dina Rich MD Electronically signed by Dina Rich MD Signature Date/Time: 05/21/2021/1:02:57 PM    Final     Radiological Exams on Admission: NM Pulmonary Perfusion  Result Date: 05/21/2021 CLINICAL DATA:  Chest pain and shortness of breath. EXAM: NUCLEAR MEDICINE PERFUSION LUNG SCAN TECHNIQUE: Perfusion images were obtained in multiple projections after intravenous injection of radiopharmaceutical. Ventilation scans intentionally deferred if perfusion scan and chest x-ray adequate for interpretation during COVID 19 epidemic. RADIOPHARMACEUTICALS:  4.4 mCi Tc-43m MAA IV COMPARISON:  Single-view of the chest today. FINDINGS: Perfusion is normal without segmental or subsegmental defect. IMPRESSION: Negative for pulmonary embolus.  Electronically Signed   By: Drusilla Kanner M.D.   On: 05/21/2021 13:28   US Venous Img Lower Bilateral (DVT)  Result Date: 05/21/2021 CLINICAL DATA:  Bilateral lower extremity edema EXAM: BILATERAL LOWER EXTREMITY VENOUS DOPPLER ULTRASOUND TECHNIQUE: Gray-scale sonography with compression, as well as color and duplex ultrasound, were performed to evaluate the deep venous system(s) from the level of the common femoral vein through the popliteal and proximal calf veins. COMPARISON:  None. FINDINGS: VENOUS Normal compressibility of the common femoral, superficial femoral, and popliteal veins, as well as the visualized calf veins. Visualized portions of profunda femoral vein and great saphenous vein unremarkable. No filling defects to suggest DVT on grayscale or color Doppler imaging. Doppler waveforms show normal direction of venous flow, normal respiratory plasticity and response to augmentation. Limited views of the contralateral common femoral vein are unremarkable. OTHER None. Limitations: none IMPRESSION: No lower extremity DVT Electronically Signed   By: Acquanetta Belling M.D.   On: 05/21/2021 12:39   DG Chest Port 1 View  Result Date: 05/21/2021 CLINICAL DATA:  65 year old female with right side chest pain radiating to the back. Shortness of breath. EXAM: PORTABLE CHEST 1 VIEW COMPARISON:  None. FINDINGS: Portable AP semi upright view at 1004 hours. Cardiomegaly. Other mediastinal contours are within normal limits. Hypoventilation at the left lung base appears most likely due to atelectasis. Elsewhere when allowing for portable technique the lungs are clear. No pneumothorax. No acute osseous abnormality identified. Paucity of bowel gas in the upper abdomen. IMPRESSION: Cardiomegaly with associated lung base atelectasis. Electronically Signed   By: Odessa Fleming M.D.   On: 05/21/2021 10:34   ECHOCARDIOGRAM COMPLETE  Result Date: 05/21/2021    ECHOCARDIOGRAM REPORT   Patient Name:   Tammy Small Date of Exam:  05/21/2021 Medical Rec #:  [id number]    Height:       62.0 in Accession #:    [id number]   Weight:       229.1 lb Date of Birth:  02-06-1956   BSA:  2.025 m Patient Age:    64 years     BP:           136/91 mmHg Patient Gender: F            HR:           74 bpm. [facility] Procedure: 2D Echo, Cardiac Doppler and Color Doppler Indications:    Abnormal ECG R94.31  History:        Patient has prior history of Echocardiogram examinations, most                 recent 09/08/2020. Arrythmias:Atrial Fibrillation; Risk                 Factors:Dyslipidemia, Diabetes, Hypertension and Former Smoker.                 Hx of pericardial effusion, chronic kidney disease stage IIIb.  Sonographer:    Celesta Gentile RCS Referring Phys: [phone] BRIAN MILLER IMPRESSIONS  1. Left ventricular ejection fraction, by estimation, is 70 to 75%. The left ventricle has hyperdynamic function. The left ventricle has no regional wall motion abnormalities. There is moderate left ventricular hypertrophy. Left ventricular diastolic parameters are indeterminate.  2. Right ventricular systolic function is low normal. The right ventricular size is mildly enlarged. There is severely elevated pulmonary artery systolic pressure.  3. Left atrial size was mild to moderately dilated.  4. Right atrial size was mildly dilated.  5. Moderate pericardial effusion. The pericardial effusion is circumferential. There is no evidence of cardiac tamponade.  6. The mitral valve is normal in structure. Trivial mitral valve regurgitation. No evidence of mitral stenosis.  7. The aortic valve is tricuspid. There is mild calcification of the aortic valve. There is mild thickening of the aortic valve. Aortic valve regurgitation is not visualized. No aortic stenosis is present.  8. The inferior vena cava is dilated in size with >50% respiratory variability, suggesting right atrial pressure of 8 mmHg. FINDINGS  Left Ventricle: Left ventricular ejection  fraction, by estimation, is 70 to 75%. The left ventricle has hyperdynamic function. The left ventricle has no regional wall motion abnormalities. The left ventricular internal cavity size was normal in size. There is moderate left ventricular hypertrophy. Left ventricular diastolic parameters are indeterminate. Right Ventricle: The right ventricular size is mildly enlarged. Right vetricular wall thickness was not assessed. Right ventricular systolic function is low normal. There is severely elevated pulmonary artery systolic pressure. The tricuspid regurgitant velocity is 3.98 m/s, and with an assumed right atrial pressure of 8 mmHg, the estimated right ventricular systolic pressure is 71.4 mmHg. Left Atrium: Left atrial size was mild to moderately dilated. Right Atrium: Right atrial size was mildly dilated. Pericardium: A moderately sized pericardial effusion is present. The pericardial effusion is circumferential. There is no evidence of cardiac tamponade. Mitral Valve: The mitral valve is normal in structure. There is mild thickening of the mitral valve leaflet(s). There is mild calcification of the mitral valve leaflet(s). Mild mitral annular calcification. Trivial mitral valve regurgitation. No evidence  of mitral valve stenosis. Tricuspid Valve: The tricuspid valve is normal in structure. Tricuspid valve regurgitation is trivial. No evidence of tricuspid stenosis. Aortic Valve: The aortic valve is tricuspid. There is mild calcification of the aortic valve. There is mild thickening of the aortic valve. There is mild aortic valve annular calcification. Aortic valve regurgitation is not visualized. No aortic stenosis  is present. Aortic valve mean gradient measures 4.0 mmHg.  Aortic valve peak gradient measures 9.3 mmHg. Aortic valve area, by VTI measures 1.77 cm. Pulmonic Valve: The pulmonic valve was not well visualized. Pulmonic valve regurgitation is mild. No evidence of pulmonic stenosis. Aorta: The aortic  root is normal in size and structure. Venous: The inferior vena cava is dilated in size with greater than 50% respiratory variability, suggesting right atrial pressure of 8 mmHg. IAS/Shunts: No atrial level shunt detected by color flow Doppler.  LEFT VENTRICLE PLAX 2D LVIDd:         3.70 cm LVIDs:         1.50 cm LV PW:         1.40 cm LV IVS:        1.40 cm LVOT diam:     1.80 cm LV SV:         51 LV SV Index:   25 LVOT Area:     2.54 cm  RIGHT VENTRICLE TAPSE (M-mode): 1.7 cm LEFT ATRIUM              Index       RIGHT ATRIUM           Index LA diam:        4.70 cm  2.32 cm/m  RA Area:     29.90 cm LA Vol (A2C):   102.0 ml 50.37 ml/m RA Volume:   95.80 ml  47.31 ml/m LA Vol (A4C):   81.1 ml  40.05 ml/m LA Biplane Vol: 92.8 ml  45.82 ml/m  AORTIC VALVE AV Area (Vmax):    1.82 cm AV Area (Vmean):   2.03 cm AV Area (VTI):     1.77 cm AV Vmax:           152.50 cm/s AV Vmean:          93.700 cm/s AV VTI:            0.290 m AV Peak Grad:      9.3 mmHg AV Mean Grad:      4.0 mmHg LVOT Vmax:         108.83 cm/s LVOT Vmean:        74.633 cm/s LVOT VTI:          0.202 m LVOT/AV VTI ratio: 0.70  AORTA Ao Root diam: 3.50 cm MITRAL VALVE                TRICUSPID VALVE MV Area (PHT): 4.12 cm     TR Peak grad:   63.4 mmHg MV Decel Time: 184 msec     TR Vmax:        398.00 cm/s MV E velocity: 122.00 cm/s                             SHUNTS                             Systemic VTI:  0.20 m                             Systemic Diam: 1.80 cm Dina Rich MD Electronically signed by Dina Rich MD Signature Date/Time: 05/21/2021/1:02:57 PM    Final     DVT Prophylaxis -SCD /iv heparin AM Labs Ordered, also please review Full Orders  Family Communication: Admission, patients condition and plan of care including tests being ordered have  been discussed with the patient who indicate understanding and agree with the plan   Code Status - Full Code  Likely DC to  home after improvement in cardiopulmonary status with  diuresis  Condition   stable  Shon Hale M.D on 05/21/2021 at 7:00 PM Go to [email or web address] -  for contact info  [facility] - Office  [phone]

## 2021-05-21 NOTE — ED Provider Notes (Signed)
[facility] EMERGENCY DEPARTMENT Provider Note   CSN: [id number] Arrival date & time: 05/21/21  9390     History Chief Complaint  Patient presents with   Chest Pain    Tammy Small is a 65 y.o. female.   Chest Pain  This patient is a 65 year old female with a known history of diabetes hypertension and hyperlipidemia.  She has been taking some fluid pills in the past because of the swelling of her legs, she is also on metoprolol losartan and amlodipine.  She presents to the hospital today with a complaint of shortness of breath associated with some chest pain.  She reports that over the last week she has developed this exertional shortness of breath and dyspnea associated with that is a chest pain that comes on when this occurs.  She reports that she can only walk about 10-20 steps before she has to stop and catch her breath.  She states she has never had anything like this, she has a history of bilateral lower extremity edema but it is seem to have gotten a little bit worse.  The patient states that this pain is both right and left side, sometimes it is sharp, it does radiate to her back.  There is no coughing or fever, there is no nausea vomiting or diarrhea.  Her appetite has been normal.  She is never required oxygen, she does not smoke.  She denies any history of congestive heart failure or obstructive coronary disease though she does have a history of atrial fibrillation, there was possibly an ablation in the past, she is from [place].  The patient has been seen by Dr. Diona Browner with the cardiology service as recently as September 03, 2020.  An echocardiogram from July 2021 was referenced, showed ejection fraction of greater than 75% no wall motion abnormalities, there was mild left ventricular hypertrophy there was a pericardial effusion was not hemodynamically destabilizing.  Her diagnosis at that time was chronic diastolic heart failure, asymptomatic pericardial effusion and chronic  kidney disease stage IIIb.    Past Medical History:  Diagnosis Date   Essential hypertension    Hyperlipidemia    Type 2 diabetes mellitus (HCC)     Patient Active Problem List   Diagnosis Date Noted   Dyspnea and respiratory abnormalities 05/21/2021    Past Surgical History:  Procedure Laterality Date   CESAREAN SECTION       OB History   No obstetric history on file.     Family History  Problem Relation Age of Onset   Hypertension Father    Diabetes Father     Social History   Tobacco Use   Smoking status: Former    Packs/day: 0.50    Years: 3.00    Pack years: 1.50    Types: Cigarettes    Quit date: 11/07/1972    Years since quitting: 48.5   Smokeless tobacco: Never  Vaping Use   Vaping Use: Never used  Substance Use Topics   Alcohol use: Not Currently    Comment: last use 1974   Drug use: Not Currently    Types: Cocaine    Comment: last use 1974    Home Medications Prior to Admission medications   Medication Sig Start Date End Date Taking? Authorizing Provider  amLODipine (NORVASC) 10 MG tablet TAKE 1 Tablet BY MOUTH ONCE EVERY DAY 01/04/21   Jacquelin Hawking, PA-C  aspirin 81 MG chewable tablet Chew 81 mg by mouth daily.    [provider]  atorvastatin (LIPITOR) 20 MG tablet TAKE 1 Tablet BY MOUTH ONCE DAILY 01/04/21   Jacquelin HawkingMcElroy, Shannon, PA-C  Cholecalciferol (VITAMIN D3 PO) Take 1 tablet by mouth daily.    [provider]  furosemide (LASIX) 20 MG tablet Take 1 tablet (20 mg total) by mouth daily. 07/10/20 10/08/20  Jonelle SidleMcDowell, Samuel G, MD  losartan (COZAAR) 100 MG tablet TAKE 1 Tablet BY MOUTH ONCE DAILY 01/04/21   Jacquelin HawkingMcElroy, Shannon, PA-C  metoprolol tartrate (LOPRESSOR) 25 MG tablet Take 1 tablet (25 mg total) by mouth 2 (two) times daily. 07/10/20 10/08/20  Jonelle SidleMcDowell, Samuel G, MD  Omega-3 Fatty Acids (OMEGA 3 PO) Take 1 capsule by mouth daily.    [provider]  Riboflavin (VITAMIN B-2 PO) Take 1 tablet by mouth daily.     [provider]    Allergies    Patient has no known allergies.  Review of Systems   Review of Systems  Cardiovascular:  Positive for chest pain.  All other systems reviewed and are negative.  Physical Exam Updated Vital Signs BP (!) 136/91   Pulse 72   Temp 98 F (36.7 C) (Oral)   Resp 16   Ht 1.575 m (5\' 2" )   Wt 103.9 kg   SpO2 96%   BMI 41.90 kg/m   Physical Exam Vitals and nursing note reviewed.  Constitutional:      General: She is not in acute distress.    Appearance: She is well-developed.  HENT:     Head: Normocephalic and atraumatic.     Mouth/Throat:     Pharynx: No oropharyngeal exudate.  Eyes:     General: No scleral icterus.       Right eye: No discharge.        Left eye: No discharge.     Conjunctiva/sclera: Conjunctivae normal.     Pupils: Pupils are equal, round, and reactive to light.  Neck:     Thyroid: No thyromegaly.     Vascular: No JVD.  Cardiovascular:     Rate and Rhythm: Normal rate. Rhythm irregular.     Pulses: Normal pulses.     Heart sounds: Normal heart sounds. No murmur heard.   No friction rub. No gallop.     Comments: Heart rate in the 60s, slightly irregular Pulmonary:     Effort: Pulmonary effort is normal. No respiratory distress.     Breath sounds: Normal breath sounds. No wheezing or rales.  Abdominal:     General: Bowel sounds are normal. There is no distension.     Palpations: Abdomen is soft. There is no mass.     Tenderness: There is no abdominal tenderness.  Musculoskeletal:        General: No tenderness. Normal range of motion.     Cervical back: Normal range of motion and neck supple.  Lymphadenopathy:     Cervical: No cervical adenopathy.  Skin:    General: Skin is warm and dry.     Findings: No erythema or rash.  Neurological:     Mental Status: She is alert.     Coordination: Coordination normal.  Psychiatric:        Behavior: Behavior normal.    ED Results / Procedures / Treatments    Labs (all labs ordered are listed, but only abnormal results are displayed) Labs Reviewed  CBC WITH DIFFERENTIAL/PLATELET - Abnormal; Notable for the following components:      Result Value   RDW 15.9 (*)    All other components within  normal limits  COMPREHENSIVE METABOLIC PANEL - Abnormal; Notable for the following components:   Potassium 3.4 (*)    Glucose, Bld 112 (*)    BUN 24 (*)    Creatinine, Ser 1.56 (*)    Total Bilirubin 1.7 (*)    GFR, Estimated 37 (*)    All other components within normal limits  TROPONIN I (HIGH SENSITIVITY) - Abnormal; Notable for the following components:   Troponin I (High Sensitivity) 22 (*)    All other components within normal limits  RESP PANEL BY RT-PCR (FLU A&B, COVID) [id number]  D-DIMER, QUANTITATIVE  BRAIN NATRIURETIC PEPTIDE    EKG EKG Interpretation  Date/Time:  Friday May 21 2021 09:37:31 EDT Ventricular Rate:  67 PR Interval:    QRS Duration: 97 QT Interval:  428 QTC Calculation: 452 R Axis:   52 Text Interpretation: Atrial fibrillation Borderline repolarization abnormality No old tracing to compare Confirmed by Eber Hong ([id number]) on 05/21/2021 10:05:03 AM  Radiology DG Chest Port 1 View  Result Date: 05/21/2021 CLINICAL DATA:  65 year old female with right side chest pain radiating to the back. Shortness of breath. EXAM: PORTABLE CHEST 1 VIEW COMPARISON:  None. FINDINGS: Portable AP semi upright view at 1004 hours. Cardiomegaly. Other mediastinal contours are within normal limits. Hypoventilation at the left lung base appears most likely due to atelectasis. Elsewhere when allowing for portable technique the lungs are clear. No pneumothorax. No acute osseous abnormality identified. Paucity of bowel gas in the upper abdomen. IMPRESSION: Cardiomegaly with associated lung base atelectasis. Electronically Signed   By: Odessa Fleming M.D.   On: 05/21/2021 10:34    Procedures .Critical Care  Date/Time: 05/21/2021 10:43 AM Performed by:  Eber Hong, MD Authorized by: Eber Hong, MD   Critical care provider statement:    Critical care time (minutes):  35   Critical care time was exclusive of:  Separately billable procedures and treating other patients and teaching time   Critical care was necessary to treat or prevent imminent or life-threatening deterioration of the following conditions:  Cardiac failure   Critical care was time spent personally by me on the following activities:  Blood draw for specimens, development of treatment plan with patient or surrogate, discussions with consultants, evaluation of patient's response to treatment, examination of patient, obtaining history from patient or surrogate, ordering and performing treatments and interventions, ordering and review of laboratory studies, ordering and review of radiographic studies, pulse oximetry, re-evaluation of patient's condition and review of old charts   Medications Ordered in ED Medications  aspirin chewable tablet 324 mg (324 mg Oral Given 05/21/21 0958)    ED Course  I have reviewed the triage vital signs and the nursing notes.  Pertinent labs & imaging results that were available during my care of the patient were reviewed by me and considered in my medical decision making (see chart for details).  Clinical Course as of 05/21/21 1044  Fri May 21, 2021  1017 There is cardiomegaly present, unfortunately I do not have any prior chest x-rays to compare. [BM]    Clinical Course User Index [BM] Eber Hong, MD   MDM Rules/Calculators/A&P                          Based on the EKG from today the patient does appear to be in atrial fibrillation, rate is controlled, no signs of ST elevation or depression, no signs of left ventricular hypertrophy, this is an unremarkable EKG  with regards to ischemia.  The cause of the patient's shortness of breath and dyspnea on exertion is in question, will get a BNP a chest x-ray troponin as she does have some  chest pain with the shortness of breath when she exerts herself.  She is agreeable, aspirin given  I have reviewed the labs, there is a slightly elevated troponin, there does appear to be significant cardiomegaly, due to the patient's new onset of exertional symptoms I have discussed the case with the cardiologist Dr. Wyline Mood who agrees the patient should be admitted, they will consult, echocardiogram has been ordered.  Toponin is elevated  Final Clinical Impression(s) / ED Diagnoses Final diagnoses:  Dyspnea on exertion  Exertional chest pain  Cardiomegaly     Eber Hong, MD 05/21/21 1044

## 2021-05-21 NOTE — Consult Note (Signed)
Cardiology Consultation:   Patient ID: Tammy Small MRN: [id number]; DOB: October 26, 1956  Admit date: 05/21/2021 Date of Consult: 05/21/2021  PCP:  Jacquelin Hawking, PA-C   [facility] Providers Cardiologist:  Nona Dell, MD   {      Patient Profile:   Tammy Small is a 65 y.o. female with a hx of HTN, hyperlipidemia, DM2, chronic diastolic HF, CKD III, moderate pericardial effusion who is being seen 05/21/2021 for the evaluation of chest pain and SOB at the request of Dr Hyacinth Meeker.  History of Present Illness:   Tammy Small 65 yo female history of HTN, hyperlipidemia, DM2, chronic diastolic HF, CKD III, moderate pericardial effusion presents with chest pain and SOB, some increased LE edema. In ER sats mid 80s on RA.  On further history she does report a history of afib in the past, about 4-5 years ago when she was living in [place]. It sounds like she may have had an ablation procedure, she had been on xarelto at one point but came off a few years ago.   Patient reports symptoms started on Sunday while moving furniture. Initially a sharp pain in her mid back followed SOB. She stopped and rested, and symptoms resolved. Continued her activities with recurrent symptoms on and off. Ongoing symptoms the next several days of SOB/DOE combined with a chest fullness with activity. She does report some increased LE edema over this time frame but stable home weights 225-228 lbs. Reports compliance with her diuretic at home.    DDimer 0.40 WBC 5.7 Hgb 13.9 Plt 254 K 3.4 Cr 1.56 BUN 24 BNP 489 CXR cardiomegaly Trop 22 COVID pending EKG rate controlled afib   Past Medical History:  Diagnosis Date   Essential hypertension    Hyperlipidemia    Type 2 diabetes mellitus (HCC)     Past Surgical History:  Procedure Laterality Date   CESAREAN SECTION         Inpatient Medications: Scheduled Meds:  amLODipine  10 mg Oral Daily   aspirin  81 mg Oral Daily   atorvastatin  20 mg Oral QPM    furosemide  40 mg Intravenous BID   furosemide  20 mg Oral Daily   heparin  5,000 Units Subcutaneous Q8H   metoprolol tartrate  25 mg Oral BID   potassium chloride  40 mEq Oral Q6H   sodium chloride flush  3 mL Intravenous Q12H   sodium chloride flush  3 mL Intravenous Q12H   Continuous Infusions:  sodium chloride     PRN Meds: sodium chloride, acetaminophen **OR** acetaminophen, albuterol, bisacodyl, labetalol, ondansetron **OR** ondansetron (ZOFRAN) IV, polyethylene glycol, sodium chloride flush, traZODone  Allergies:   No Known Allergies  Social History:   Social History   Socioeconomic History   Marital status: Single    Spouse name: Not on file   Number of children: Not on file   Years of education: Not on file   Highest education level: Not on file  Occupational History   Not on file  Tobacco Use   Smoking status: Former    Packs/day: 0.50    Years: 3.00    Pack years: 1.50    Types: Cigarettes    Quit date: 11/07/1972    Years since quitting: 48.5   Smokeless tobacco: Never  Vaping Use   Vaping Use: Never used  Substance and Sexual Activity   Alcohol use: Not Currently    Comment: last use 1974   Drug use: Not Currently  Types: Cocaine    Comment: last use 1974   Sexual activity: Not on file  Other Topics Concern   Not on file  Social History Narrative   ** Merged History Encounter **       Social Determinants of Health   Financial Resource Strain: Not on file  Food Insecurity: Not on file  Transportation Needs: Not on file  Physical Activity: Not on file  Stress: Not on file  Social Connections: Not on file  Intimate Partner Violence: Not on file    Family History:    Family History  Problem Relation Age of Onset   Hypertension Father    Diabetes Father      ROS:  Please see the history of present illness.  All other ROS reviewed and negative.     Physical Exam/Data:   Vitals:   05/21/21 0934 05/21/21 0946 05/21/21 1034  BP:  134/69  (!) 136/91  Pulse:  72 72  Resp:  15 16  Temp:  98 F (36.7 C)   TempSrc:  Oral   SpO2:  94% 96%  Weight: 103.9 kg    Height: 5\' 2"  (1.575 m)     No intake or output data in the 24 hours ending 05/21/21 1201 Last 3 Weights 05/21/2021 02/15/2021 11/16/2020  Weight (lbs) 229 lb 0.9 oz 229 lb 226 lb 4 oz  Weight (kg) 103.9 kg 103.874 kg 102.626 kg     Body mass index is 41.9 kg/m.  General:  Well nourished, well developed, in no acute distress HEENT: normal Lymph: no adenopathy Neck: elevated JVD Endocrine:  No thryomegaly Vascular: No carotid bruits; FA pulses 2+ bilaterally without bruits  Cardiac:  irreg, no murmur  Lungs:  faint crackles bilateral bases Abd: soft, nontender, no hepatomegaly  Ext: no edema Musculoskeletal:  No deformities, BUE and BLE strength normal and equal Skin: warm and dry  Neuro:  CNs 2-12 intact, no focal abnormalities noted Psych:  Normal affect    Laboratory Data:  High Sensitivity Troponin:   Recent Labs  Lab 05/21/21 0944  TROPONINIHS 22*     Chemistry Recent Labs  Lab 05/21/21 0944  NA 140  K 3.4*  CL 102  CO2 29  GLUCOSE 112*  BUN 24*  CREATININE 1.56*  CALCIUM 9.3  GFRNONAA 37*  ANIONGAP 9    Recent Labs  Lab 05/21/21 0944  PROT 7.1  ALBUMIN 4.3  AST 20  ALT 20  ALKPHOS 67  BILITOT 1.7*   Hematology Recent Labs  Lab 05/21/21 0944  WBC 5.7  RBC 4.41  HGB 13.9  HCT 43.3  MCV 98.2  MCH 31.5  MCHC 32.1  RDW 15.9*  PLT 254   BNP Recent Labs  Lab 05/21/21 0945  BNP 489.0*    DDimer  Recent Labs  Lab 05/21/21 0944  DDIMER 0.40     Radiology/Studies:  DG Chest Port 1 View  Result Date: 05/21/2021 CLINICAL DATA:  66 year old female with right side chest pain radiating to the back. Shortness of breath. EXAM: PORTABLE CHEST 1 VIEW COMPARISON:  None. FINDINGS: Portable AP semi upright view at 1004 hours. Cardiomegaly. Other mediastinal contours are within normal limits. Hypoventilation at the left lung  base appears most likely due to atelectasis. Elsewhere when allowing for portable technique the lungs are clear. No pneumothorax. No acute osseous abnormality identified. Paucity of bowel gas in the upper abdomen. IMPRESSION: Cardiomegaly with associated lung base atelectasis. Electronically Signed   By: 65.D.  On: 05/21/2021 10:34     Assessment and Plan:   SOB/Chest pain - unclear etiology at this time - some signs of fluid overload that likely contributing, unclear if sole etiology - will start IV lasix 40mg  bid here, f/u repeat echo. Follow symptoms with diuresis - follow up troponin trends and EKGs - primary team is obtaining VQ    2. Afib - remote history per her report, appears she may have had an ablation procedure in a few years ago and came off anticoag at that time - EKG today shows rate controlled afib - CHADS2Vasc score is at least 4, will require anticoagulation. Unclear if potential invasive procedures may be required this admission, would start heparin at this time and then later convert to oral eliquis closer to discharge.       :[place]  New York Heart Association (NYHA) Functional Class NYHA Class III  CHA2DS2-VASc Score = 4  {Click here to calculate score. This indicates a 4.8% annual risk of stroke. The patient's score is based upon: CHF History: Yes HTN History: Yes Diabetes History: Yes Stroke History: No Vascular Disease History: No Age Score: 0 Gender Score: 1     For questions or updates, please contact [facility] Please consult [email or web address] for contact info under    Signed, [phone]}, MD  05/21/2021 12:01 PM

## 2021-05-21 NOTE — Progress Notes (Signed)
ANTICOAGULATION CONSULT NOTE - Initial Consult  Pharmacy Consult for heparin gtt  Indication: atrial fibrillation  No Known Allergies  Patient Measurements: Height: 5\' 2"  (157.5 cm) Weight: 103.9 kg (229 lb 0.9 oz) IBW/kg (Calculated) : 50.1 Heparin Dosing Weight: HEPARIN DW (KG): 75   Vital Signs: Temp: 98 F (36.7 C) (07/15 0946) Temp Source: Oral (07/15 0946) BP: 136/91 (07/15 1034) Pulse Rate: 72 (07/15 1034)  Labs: Recent Labs    05/21/21 0944  HGB 13.9  HCT 43.3  PLT 254  CREATININE 1.56*    Estimated Creatinine Clearance: 41.7 mL/min (A) (by C-G formula based on SCr of 1.56 mg/dL (H)).   Medical History: Past Medical History:  Diagnosis Date   Essential hypertension    Hyperlipidemia    Type 2 diabetes mellitus (HCC)     Medications:  (Not in a hospital admission)  Scheduled:   amLODipine  10 mg Oral Daily   aspirin  81 mg Oral Daily   atorvastatin  20 mg Oral QPM   furosemide  40 mg Intravenous BID   furosemide  20 mg Oral Daily   heparin  4,000 Units Intravenous Once   metoprolol tartrate  25 mg Oral BID   potassium chloride  40 mEq Oral Q6H   sodium chloride flush  3 mL Intravenous Q12H   sodium chloride flush  3 mL Intravenous Q12H   Infusions:   sodium chloride     heparin     PRN: sodium chloride, acetaminophen **OR** acetaminophen, albuterol, bisacodyl, labetalol, ondansetron **OR** ondansetron (ZOFRAN) IV, polyethylene glycol, sodium chloride flush, traZODone Anti-infectives (From admission, onward)    None       Assessment: Tammy Small a 65 y.o. female requires anticoagulation with a heparin iv infusion for the indication of  atrial fibrillation. Heparin gtt will be started following pharmacy protocol per pharmacy consult. Patient is not on previous oral anticoagulant that will require aPTT/HL correlation before transitioning to only HL monitoring.   Goal of Therapy:  Heparin level 0.3-0.7 units/ml Monitor platelets by  anticoagulation protocol: Yes   Plan:  Give 4000 units bolus x 1 Start heparin infusion at 1650 units/hr Check anti-Xa level in 6 hours and daily while on heparin Continue to monitor H&H and platelets  Heparin level to be drawn in 6 hours  Tammy Small 05/21/2021,1:07 PM

## 2021-05-21 NOTE — ED Triage Notes (Signed)
Pt to the ED with right side chest pain that radiates to her back. Pt has shortness of breath with exertion.  Pt has bilateral lower extremity edema.  Symptoms began on 05/16/21.

## 2021-05-21 NOTE — ED Notes (Signed)
Placed pt on oxygen 2lpm via [place] due to spo2 of 84

## 2021-05-21 NOTE — Progress Notes (Signed)
*  PRELIMINARY RESULTS* Echocardiogram 2D Echocardiogram has been performed.  Stacey Drain 05/21/2021, 12:04 PM

## 2021-05-22 ENCOUNTER — Encounter (HOSPITAL_COMMUNITY): Payer: Self-pay | Admitting: Family Medicine

## 2021-05-22 DIAGNOSIS — I509 Heart failure, unspecified: Secondary | ICD-10-CM

## 2021-05-22 LAB — CBC
HCT: 42.6 % (ref 36.0–46.0)
Hemoglobin: 13.6 g/dL (ref 12.0–15.0)
MCH: 31.6 pg (ref 26.0–34.0)
MCHC: 31.9 g/dL (ref 30.0–36.0)
MCV: 98.8 fL (ref 80.0–100.0)
Platelets: 232 10*3/uL (ref 150–400)
RBC: 4.31 MIL/uL (ref 3.87–5.11)
RDW: 16.1 % — ABNORMAL HIGH (ref 11.5–15.5)
WBC: 5.3 10*3/uL (ref 4.0–10.5)
nRBC: 0 % (ref 0.0–0.2)

## 2021-05-22 LAB — BASIC METABOLIC PANEL
Anion gap: 7 (ref 5–15)
BUN: 20 mg/dL (ref 8–23)
CO2: 29 mmol/L (ref 22–32)
Calcium: 8.8 mg/dL — ABNORMAL LOW (ref 8.9–10.3)
Chloride: 105 mmol/L (ref 98–111)
Creatinine, Ser: 1.15 mg/dL — ABNORMAL HIGH (ref 0.44–1.00)
GFR, Estimated: 54 mL/min — ABNORMAL LOW (ref >60)
Glucose, Bld: 94 mg/dL (ref 70–99)
Potassium: 4 mmol/L (ref 3.5–5.1)
Sodium: 141 mmol/L (ref 135–145)

## 2021-05-22 LAB — HEPARIN LEVEL (UNFRACTIONATED)
Heparin Unfractionated: 0.95 IU/mL — ABNORMAL HIGH (ref 0.30–0.70)
Heparin Unfractionated: 0.68 IU/mL (ref 0.30–0.70)
Heparin Unfractionated: 0.39 IU/mL (ref 0.30–0.70)

## 2021-05-22 NOTE — Progress Notes (Signed)
Patient Demographics:    Tammy Small, is a 65 y.o. female, DOB - 1956-07-19, ZOX:[id number]  Admit date - 05/21/2021   Admitting Physician Luzmaria Devaux Mariea Clonts, MD  Outpatient Primary MD for the patient is Jacquelin [facility], PA-C  LOS - 0   Chief Complaint  Patient presents with   Chest Pain        Subjective:    Tammy Small today has no fevers, no emesis,  No further chest pain, shortness of breath persist  Assessment  & Plan :    Principal Problem:   Acute respiratory failure with hypoxia (HCC) Active Problems:   Dyspnea and respiratory abnormalities   Paroxysmal A-fib (HCC)   HTN (hypertension)   Obesity (BMI 30-39.9)   Acute exacerbation of CHF (congestive heart failure) (HCC)  Brief Summary:- 65 y.o. female who is a reformed smoker with a hx of HTN, hyperlipidemia, DM2, chronic diastolic HF, CKD III, moderate pericardial effusion, obesity, paroxysmal A. fib with prior ablation back in [place] admitted on 05/21/2021 with chest discomfort and dyspnea and found to have acute hypoxic respiratory failure   A/p  1)Acute hypoxic respiratory failure--- suspect secondary to fluid overload/-- --acute on chronic diastolic CHF exacerbation in the setting of severe pulmonary hypertension and moderate pericardial effusion -Echo from 05/21/2021 with EF of 70 to 75%, no regional wall motion abnormalities, severely elevated pulmonary artery systolic pressure noted -Echo also confirms moderate pericardial effusion without tamponade --Chest x-ray with cardiomegaly otherwise no acute findings -Troponin is 22, repeat troponin is 18 -BNP elevated at 489 , no prior available for comparison -Given hypoxia, chest discomfort and elevated pulmonary artery pressures PE rule out done with  VQ scan and lower extremity Dopplers which are negative -Hypoxia resolved with diuresis---  -Cardiology consult  appreciated -Continue IV diuresis  2)PAFiB--status post prior ablation in [place], unsuccessful -Cardiology consult appreciated -Continue metoprolol for rate control -CHA2DS2-VASc score is 4 -IV heparin for stroke prophylaxis, with plans to transition to Eliquis upon discharge   3)HTN--continue amlodipine, metoprolol and Lasix -Hold losartan   4)Morbid Obesity- -Low calorie diet, portion control and increase physical activity discussed with patient -Body mass index is 38.49 kg/m.   5)Hypokalemia-replace and monitor closely especially with IV Lasix use   6)AKI----acute kidney injury on CKD stage - 3A --Baseline creatinine appears to be around 1.2 -Creatinine is back down to 1.1 from 1.56 on admission  renally adjust medications, avoid nephrotoxic agents / dehydration  / hypotension     Disposition/Need for in-Hospital Stay- patient unable to be discharged at this time due to hypoxia with CHF exacerbation requiring IV diuresis in the setting of AKI and hypokalemia requiring monitoring of renal parameters and electrolytes   Status is: Inpatient   Remains inpatient appropriate because: Please see above   Dispo: The patient is from: Home              Anticipated d/c is to: Home              Anticipated d/c date is: 1 days              Patient currently is not medically stable to d/c. Barriers: Not Clinically Stable-  Code Status :  -  Code Status: Full Code   Family  Communication:    NA (patient is alert, awake and coherent)   Consults  :  cardiology  DVT Prophylaxis  :   - SCDs  SCDs Start: 05/21/21 1046 Place TED hose Start: 05/21/21 1046    Lab Results  Component Value Date   PLT 232 05/22/2021    Inpatient Medications  Scheduled Meds:  amLODipine  10 mg Oral Daily   aspirin  81 mg Oral Daily   atorvastatin  20 mg Oral QPM   furosemide  40 mg Intravenous BID   metoprolol tartrate  25 mg Oral BID   sodium chloride flush  3 mL Intravenous Q12H   sodium  chloride flush  3 mL Intravenous Q12H   Continuous Infusions:  sodium chloride     heparin 1,200 Units/hr (05/22/21 0451)   PRN Meds:.sodium chloride, acetaminophen **OR** acetaminophen, albuterol, bisacodyl, labetalol, ondansetron **OR** ondansetron (ZOFRAN) IV, polyethylene glycol, sodium chloride flush, traZODone    Anti-infectives (From admission, onward)    None         Objective:   Vitals:   05/22/21 0422 05/22/21 0544 05/22/21 0944 05/22/21 1357  BP:  112/68 121/76 122/65  Pulse:  66 79 (!) 108  Resp:  18  16  Temp:  98.2 F (36.8 C)  97.7 F (36.5 C)  TempSrc:  Oral  Oral  SpO2:  95%  93%  Weight: 107.4 kg     Height:        Wt Readings from Last 3 Encounters:  05/22/21 107.4 kg  02/15/21 103.9 kg  11/16/20 102.6 kg    Intake/Output Summary (Last 24 hours) at 05/22/2021 1359 Last data filed at 05/22/2021 0949 Gross per 24 hour  Intake 523.81 ml  Output --  Net 523.81 ml   Physical Exam  Gen:- Awake Alert, in no acute distress able to speak in short sentences HEENT:- [place].AT, No sclera icterus Neck-Supple Neck,No JVD,.  Lungs-no wheezing, air movement improving  CV- S1, S2 normal, irregularly irregular  abd-  +ve B.Sounds, Abd Soft, No tenderness,    Extremity/Skin:-chronic venous stasis/pedal edema noted, intact peripheral pulses Psych-affect is appropriate, oriented x3 Neuro-no new focal deficits, no tremors   Data Review:   Micro Results Recent Results (from the past 240 hour(s))  Resp Panel by RT-PCR (Flu A&B, Covid) Nasopharyngeal Swab     Status: None   Collection Time: 05/21/21  9:40 AM   Specimen: Nasopharyngeal Swab; Nasopharyngeal(NP) swabs in vial transport medium  Result Value Ref Range Status   SARS Coronavirus 2 by RT PCR NEGATIVE NEGATIVE Final    Comment: (NOTE) SARS-CoV-2 target nucleic acids are NOT DETECTED.  The SARS-CoV-2 RNA is generally detectable in upper respiratory specimens during the acute phase of infection. The  lowest concentration of SARS-CoV-2 viral copies this assay can detect is 138 copies/mL. A negative result does not preclude SARS-Cov-2 infection and should not be used as the sole basis for treatment or other patient management decisions. A negative result may occur with  improper specimen collection/handling, submission of specimen other than nasopharyngeal swab, presence of viral mutation(s) within the areas targeted by this assay, and inadequate number of viral copies(<138 copies/mL). A negative result must be combined with clinical observations, patient history, and epidemiological information. The expected result is Negative.  Fact Sheet for Patients:  BloggerCourse.com  Fact Sheet for Healthcare Providers:  SeriousBroker.it  This test is no t yet approved or cleared by the [place] FDA and  has been authorized for detection and/or diagnosis of  SARS-CoV-2 by FDA under an Emergency Use Authorization (EUA). This EUA will remain  in effect (meaning this test can be used) for the duration of the COVID-19 declaration under Section 564(b)(1) of the Act, 21 U.S.C.section 360bbb-3(b)(1), unless the authorization is terminated  or revoked sooner.       Influenza A by PCR NEGATIVE NEGATIVE Final   Influenza B by PCR NEGATIVE NEGATIVE Final    Comment: (NOTE) The Xpert Xpress SARS-CoV-2/FLU/RSV plus assay is intended as an aid in the diagnosis of influenza from Nasopharyngeal swab specimens and should not be used as a sole basis for treatment. Nasal washings and aspirates are unacceptable for Xpert Xpress SARS-CoV-2/FLU/RSV testing.  Fact Sheet for Patients: BloggerCourse.com  Fact Sheet for Healthcare Providers: SeriousBroker.it  This test is not yet approved or cleared by the [place] FDA and has been authorized for detection and/or diagnosis of SARS-CoV-2 by FDA under  an Emergency Use Authorization (EUA). This EUA will remain in effect (meaning this test can be used) for the duration of the COVID-19 declaration under Section 564(b)(1) of the Act, 21 U.S.C. section 360bbb-3(b)(1), unless the authorization is terminated or revoked.  Performed at [facility], [address], [place], [place] [address]     Radiology Reports NM Pulmonary Perfusion  Result Date: 05/21/2021 CLINICAL DATA:  Chest pain and shortness of breath. EXAM: NUCLEAR MEDICINE PERFUSION LUNG SCAN TECHNIQUE: Perfusion images were obtained in multiple projections after intravenous injection of radiopharmaceutical. Ventilation scans intentionally deferred if perfusion scan and chest x-ray adequate for interpretation during COVID 19 epidemic. RADIOPHARMACEUTICALS:  4.4 mCi Tc-68m MAA IV COMPARISON:  Single-view of the chest today. FINDINGS: Perfusion is normal without segmental or subsegmental defect. IMPRESSION: Negative for pulmonary embolus. Electronically Signed   By: Drusilla Kanner M.D.   On: 05/21/2021 13:28   US Venous Img Lower Bilateral (DVT)  Result Date: 05/21/2021 CLINICAL DATA:  Bilateral lower extremity edema EXAM: BILATERAL LOWER EXTREMITY VENOUS DOPPLER ULTRASOUND TECHNIQUE: Gray-scale sonography with compression, as well as color and duplex ultrasound, were performed to evaluate the deep venous system(s) from the level of the common femoral vein through the popliteal and proximal calf veins. COMPARISON:  None. FINDINGS: VENOUS Normal compressibility of the common femoral, superficial femoral, and popliteal veins, as well as the visualized calf veins. Visualized portions of profunda femoral vein and great saphenous vein unremarkable. No filling defects to suggest DVT on grayscale or color Doppler imaging. Doppler waveforms show normal direction of venous flow, normal respiratory plasticity and response to augmentation. Limited views of the contralateral common femoral vein are  unremarkable. OTHER None. Limitations: none IMPRESSION: No lower extremity DVT Electronically Signed   By: Acquanetta Belling M.D.   On: 05/21/2021 12:39   DG Chest Port 1 View  Result Date: 05/21/2021 CLINICAL DATA:  65 year old female with right side chest pain radiating to the back. Shortness of breath. EXAM: PORTABLE CHEST 1 VIEW COMPARISON:  None. FINDINGS: Portable AP semi upright view at 1004 hours. Cardiomegaly. Other mediastinal contours are within normal limits. Hypoventilation at the left lung base appears most likely due to atelectasis. Elsewhere when allowing for portable technique the lungs are clear. No pneumothorax. No acute osseous abnormality identified. Paucity of bowel gas in the upper abdomen. IMPRESSION: Cardiomegaly with associated lung base atelectasis. Electronically Signed   By: Odessa Fleming M.D.   On: 05/21/2021 10:34   ECHOCARDIOGRAM COMPLETE  Result Date: 05/21/2021    ECHOCARDIOGRAM REPORT   Patient Name:   BRAYLI KLINGBEIL Date of  Exam: 05/21/2021 Medical Rec #:  [id number]    Height:       62.0 in Accession #:    [id number]   Weight:       229.1 lb Date of Birth:  [birth year]   BSA:          2.025 m Patient Age:    64 years     BP:           136/91 mmHg Patient Gender: F            HR:           74 bpm. [facility] Procedure: 2D Echo, Cardiac Doppler and Color Doppler Indications:    Abnormal ECG R94.31  History:        Patient has prior history of Echocardiogram examinations, most                 recent 09/08/2020. Arrythmias:Atrial Fibrillation; Risk                 Factors:Dyslipidemia, Diabetes, Hypertension and Former Smoker.                 Hx of pericardial effusion, chronic kidney disease stage IIIb.  Sonographer:    Celesta Gentile RCS Referring Phys: [phone] BRIAN MILLER IMPRESSIONS  1. Left ventricular ejection fraction, by estimation, is 70 to 75%. The left ventricle has hyperdynamic function. The left ventricle has no regional wall motion abnormalities. There is moderate  left ventricular hypertrophy. Left ventricular diastolic parameters are indeterminate.  2. Right ventricular systolic function is low normal. The right ventricular size is mildly enlarged. There is severely elevated pulmonary artery systolic pressure.  3. Left atrial size was mild to moderately dilated.  4. Right atrial size was mildly dilated.  5. Moderate pericardial effusion. The pericardial effusion is circumferential. There is no evidence of cardiac tamponade.  6. The mitral valve is normal in structure. Trivial mitral valve regurgitation. No evidence of mitral stenosis.  7. The aortic valve is tricuspid. There is mild calcification of the aortic valve. There is mild thickening of the aortic valve. Aortic valve regurgitation is not visualized. No aortic stenosis is present.  8. The inferior vena cava is dilated in size with >50% respiratory variability, suggesting right atrial pressure of 8 mmHg. FINDINGS  Left Ventricle: Left ventricular ejection fraction, by estimation, is 70 to 75%. The left ventricle has hyperdynamic function. The left ventricle has no regional wall motion abnormalities. The left ventricular internal cavity size was normal in size. There is moderate left ventricular hypertrophy. Left ventricular diastolic parameters are indeterminate. Right Ventricle: The right ventricular size is mildly enlarged. Right vetricular wall thickness was not assessed. Right ventricular systolic function is low normal. There is severely elevated pulmonary artery systolic pressure. The tricuspid regurgitant velocity is 3.98 m/s, and with an assumed right atrial pressure of 8 mmHg, the estimated right ventricular systolic pressure is 71.4 mmHg. Left Atrium: Left atrial size was mild to moderately dilated. Right Atrium: Right atrial size was mildly dilated. Pericardium: A moderately sized pericardial effusion is present. The pericardial effusion is circumferential. There is no evidence of cardiac tamponade. Mitral  Valve: The mitral valve is normal in structure. There is mild thickening of the mitral valve leaflet(s). There is mild calcification of the mitral valve leaflet(s). Mild mitral annular calcification. Trivial mitral valve regurgitation. No evidence  of mitral valve stenosis. Tricuspid Valve: The tricuspid valve is normal in structure. Tricuspid valve regurgitation is trivial.  No evidence of tricuspid stenosis. Aortic Valve: The aortic valve is tricuspid. There is mild calcification of the aortic valve. There is mild thickening of the aortic valve. There is mild aortic valve annular calcification. Aortic valve regurgitation is not visualized. No aortic stenosis  is present. Aortic valve mean gradient measures 4.0 mmHg. Aortic valve peak gradient measures 9.3 mmHg. Aortic valve area, by VTI measures 1.77 cm. Pulmonic Valve: The pulmonic valve was not well visualized. Pulmonic valve regurgitation is mild. No evidence of pulmonic stenosis. Aorta: The aortic root is normal in size and structure. Venous: The inferior vena cava is dilated in size with greater than 50% respiratory variability, suggesting right atrial pressure of 8 mmHg. IAS/Shunts: No atrial level shunt detected by color flow Doppler.  LEFT VENTRICLE PLAX 2D LVIDd:         3.70 cm LVIDs:         1.50 cm LV PW:         1.40 cm LV IVS:        1.40 cm LVOT diam:     1.80 cm LV SV:         51 LV SV Index:   25 LVOT Area:     2.54 cm  RIGHT VENTRICLE TAPSE (M-mode): 1.7 cm LEFT ATRIUM              Index       RIGHT ATRIUM           Index LA diam:        4.70 cm  2.32 cm/m  RA Area:     29.90 cm LA Vol (A2C):   102.0 ml 50.37 ml/m RA Volume:   95.80 ml  47.31 ml/m LA Vol (A4C):   81.1 ml  40.05 ml/m LA Biplane Vol: 92.8 ml  45.82 ml/m  AORTIC VALVE AV Area (Vmax):    1.82 cm AV Area (Vmean):   2.03 cm AV Area (VTI):     1.77 cm AV Vmax:           152.50 cm/s AV Vmean:          93.700 cm/s AV VTI:            0.290 m AV Peak Grad:      9.3 mmHg AV Mean  Grad:      4.0 mmHg LVOT Vmax:         108.83 cm/s LVOT Vmean:        74.633 cm/s LVOT VTI:          0.202 m LVOT/AV VTI ratio: 0.70  AORTA Ao Root diam: 3.50 cm MITRAL VALVE                TRICUSPID VALVE MV Area (PHT): 4.12 cm     TR Peak grad:   63.4 mmHg MV Decel Time: 184 msec     TR Vmax:        398.00 cm/s MV E velocity: 122.00 cm/s                             SHUNTS                             Systemic VTI:  0.20 m  Systemic Diam: 1.80 cm Dina RichJonathan Branch MD Electronically signed by Dina RichJonathan Branch MD Signature Date/Time: 05/21/2021/1:02:57 PM    Final      CBC Recent Labs  Lab 05/21/21 0944 05/22/21 0332  WBC 5.7 5.3  HGB 13.9 13.6  HCT 43.3 42.6  PLT 254 232  MCV 98.2 98.8  MCH 31.5 31.6  MCHC 32.1 31.9  RDW 15.9* 16.1*  LYMPHSABS 1.6  --   MONOABS 0.9  --   EOSABS 0.2  --   BASOSABS 0.1  --     Chemistries  Recent Labs  Lab 05/21/21 0944 05/21/21 1046 05/22/21 0332  NA 140  --  141  K 3.4*  --  4.0  CL 102  --  105  CO2 29  --  29  GLUCOSE 112*  --  94  BUN 24*  --  20  CREATININE 1.56*  --  1.15*  CALCIUM 9.3  --  8.8*  MG  --  1.9  --   AST 20  --   --   ALT 20  --   --   ALKPHOS 67  --   --   BILITOT 1.7*  --   --    ------------------------------------------------------------------------------------------------------------------ No results for input(s): CHOL, HDL, LDLCALC, TRIG, CHOLHDL, LDLDIRECT in the last 72 hours.  Lab Results  Component Value Date   HGBA1C 6.3 (H) 02/12/2021   ------------------------------------------------------------------------------------------------------------------ No results for input(s): TSH, T4TOTAL, T3FREE, THYROIDAB in the last 72 hours.  Invalid input(s): FREET3 ------------------------------------------------------------------------------------------------------------------ No results for input(s): VITAMINB12, FOLATE, FERRITIN, TIBC, IRON, RETICCTPCT in the last 72 hours.  Coagulation  profile No results for input(s): INR, PROTIME in the last 168 hours.  Recent Labs    05/21/21 0944  DDIMER 0.40    Cardiac Enzymes No results for input(s): CKMB, TROPONINI, MYOGLOBIN in the last 168 hours.  Invalid input(s): CK ------------------------------------------------------------------------------------------------------------------    Component Value Date/Time   BNP 489.0 (H) 05/21/2021 0945     Shon Haleourage Jaelle Campanile M.D on 05/22/2021 at 1:59 PM  Go to [email or web address] - for contact info  [facility] - Office  [phone]

## 2021-05-22 NOTE — Progress Notes (Signed)
ANTICOAGULATION CONSULT NOTE - Follow Up Consult  Pharmacy Consult for heparin Indication: atrial fibrillation  No Known Allergies  Patient Measurements: Height: 5\' 4"  (162.6 cm) Weight: 107.4 kg (236 lb 12.4 oz) IBW/kg (Calculated) : 54.7 Heparin Dosing Weight: 75 kg  Vital Signs: Temp: 98.3 F (36.8 C) (07/16 2042) Temp Source: Oral (07/16 2042) BP: 117/69 (07/16 2042) Pulse Rate: 72 (07/16 2043)  Labs: Recent Labs    05/21/21 0944 05/21/21 1054 05/21/21 1904 05/22/21 0332 05/22/21 1239 05/22/21 1946  HGB 13.9  --   --  13.6  --   --   HCT 43.3  --   --  42.6  --   --   PLT 254  --   --  232  --   --   HEPARINUNFRC  --   --    < > 0.95* 0.68 0.39  CREATININE 1.56*  --   --  1.15*  --   --   TROPONINIHS 22* 18*  --   --   --   --    < > = values in this interval not displayed.     Estimated Creatinine Clearance: 59.9 mL/min (A) (by C-G formula based on SCr of 1.15 mg/dL (H)).   Medications:  Scheduled:   amLODipine  10 mg Oral Daily   aspirin  81 mg Oral Daily   atorvastatin  20 mg Oral QPM   furosemide  40 mg Intravenous BID   metoprolol tartrate  25 mg Oral BID   sodium chloride flush  3 mL Intravenous Q12H   sodium chloride flush  3 mL Intravenous Q12H   Infusions:   sodium chloride     heparin 1,200 Units/hr (05/22/21 0451)    Assessment: 65 y/o F on heparin for afib  Heparin level therapeutic x 2 after rate decrease  Goal of Therapy:  Heparin level 0.3-0.7 units/ml Monitor platelets by anticoagulation protocol: Yes   Plan:  Cont heparin 1200 units/hr Daily CBC/heparin level Monitor for bleeding  65, PharmD, BCPS Clinical Pharmacist Phone: [phone]

## 2021-05-22 NOTE — Progress Notes (Signed)
ANTICOAGULATION CONSULT NOTE - Follow Up Consult  Pharmacy Consult for heparin Indication: atrial fibrillation  No Known Allergies  Patient Measurements: Height: 5\' 4"  (162.6 cm) Weight: 107.4 kg (236 lb 12.4 oz) IBW/kg (Calculated) : 54.7 Heparin Dosing Weight: 75 kg  Vital Signs: Temp: 97.7 F (36.5 C) (07/16 1357) Temp Source: Oral (07/16 1357) BP: 122/65 (07/16 1357) Pulse Rate: 108 (07/16 1357)  Labs: Recent Labs    05/21/21 0944 05/21/21 1054 05/21/21 1904 05/22/21 0332 05/22/21 1239  HGB 13.9  --   --  13.6  --   HCT 43.3  --   --  42.6  --   PLT 254  --   --  232  --   HEPARINUNFRC  --   --  0.90* 0.95* 0.68  CREATININE 1.56*  --   --  1.15*  --   TROPONINIHS 22* 18*  --   --   --      Estimated Creatinine Clearance: 59.9 mL/min (A) (by C-G formula based on SCr of 1.15 mg/dL (H)).   Medications:  Scheduled:   amLODipine  10 mg Oral Daily   aspirin  81 mg Oral Daily   atorvastatin  20 mg Oral QPM   furosemide  40 mg Intravenous BID   metoprolol tartrate  25 mg Oral BID   sodium chloride flush  3 mL Intravenous Q12H   sodium chloride flush  3 mL Intravenous Q12H   Infusions:   sodium chloride     heparin 1,200 Units/hr (05/22/21 0451)    Assessment: Tammy Small a 65 YO female requiring heparin IV infusion for atrial fibrillation. Heparin level is 0.68 units/ml which is now at goal.    Goal of Therapy:  Heparin level 0.3-0.7 units/ml Monitor platelets by anticoagulation protocol: Yes   Plan:  Continue heparin infusion at 1500 units/hr. Recheck HL in 6 hours to confirm will remain therapeutic  65 A 05/22/2021,2:57 PM

## 2021-05-22 NOTE — Progress Notes (Signed)
ANTICOAGULATION CONSULT NOTE - Follow Up Consult  Pharmacy Consult for heparin Indication: atrial fibrillation  No Known Allergies  Patient Measurements: Height: 5\' 4"  (162.6 cm) Weight: 107.4 kg (236 lb 12.4 oz) IBW/kg (Calculated) : 54.7 Heparin Dosing Weight: 75 kg  Vital Signs: Temp: 97.8 F (36.6 C) (07/16 0159) Temp Source: Oral (07/16 0159) BP: 117/78 (07/16 0159) Pulse Rate: 67 (07/16 0159)  Labs: Recent Labs    05/21/21 0944 05/21/21 1054 05/21/21 1904 05/22/21 0332  HGB 13.9  --   --  13.6  HCT 43.3  --   --  42.6  PLT 254  --   --  232  HEPARINUNFRC  --   --  0.90* 0.95*  CREATININE 1.56*  --   --  1.15*  TROPONINIHS 22* 18*  --   --      Estimated Creatinine Clearance: 59.9 mL/min (A) (by C-G formula based on SCr of 1.15 mg/dL (H)).   Medications:  Scheduled:   amLODipine  10 mg Oral Daily   aspirin  81 mg Oral Daily   atorvastatin  20 mg Oral QPM   furosemide  40 mg Intravenous BID   metoprolol tartrate  25 mg Oral BID   sodium chloride flush  3 mL Intravenous Q12H   sodium chloride flush  3 mL Intravenous Q12H   Infusions:   sodium chloride     heparin 1,500 Units/hr (05/22/21 05/24/21)    Assessment: 65 y/o F on heparin for afib  Heparin level remains elevated, CBC stable  Goal of Therapy:  Heparin level 0.3-0.7 units/ml Monitor platelets by anticoagulation protocol: Yes   Plan:  Dec heparin to 1200 units/hr 1300 heparin level  65, PharmD, BCPS Clinical Pharmacist Phone: [phone]

## 2021-05-23 LAB — CBC
HCT: 41.9 % (ref 36.0–46.0)
Hemoglobin: 13.2 g/dL (ref 12.0–15.0)
MCH: 31.4 pg (ref 26.0–34.0)
MCHC: 31.5 g/dL (ref 30.0–36.0)
MCV: 99.5 fL (ref 80.0–100.0)
Platelets: 222 10*3/uL (ref 150–400)
RBC: 4.21 MIL/uL (ref 3.87–5.11)
RDW: 16 % — ABNORMAL HIGH (ref 11.5–15.5)
WBC: 7.5 10*3/uL (ref 4.0–10.5)
nRBC: 0 % (ref 0.0–0.2)

## 2021-05-23 LAB — HEPARIN LEVEL (UNFRACTIONATED): Heparin Unfractionated: 0.55 IU/mL (ref 0.30–0.70)

## 2021-05-23 MED ORDER — FUROSEMIDE 40 MG PO TABS: 40.0000 mg | ORAL_TABLET | Freq: Every day | ORAL | 3 refills | Status: DC

## 2021-05-23 MED ORDER — APIXABAN 5 MG PO TABS
5.0000 mg | ORAL_TABLET | Freq: Two times a day (BID) | ORAL | Status: DC
  Administered 2021-05-23: 5 mg via ORAL
  Filled 2021-05-23: qty 1

## 2021-05-23 MED ORDER — METOPROLOL SUCCINATE ER 25 MG PO TB24: 25.0000 mg | ORAL_TABLET | Freq: Every day | ORAL | 11 refills | Status: DC

## 2021-05-23 MED ORDER — APIXABAN 5 MG PO TABS: 5.0000 mg | ORAL_TABLET | Freq: Two times a day (BID) | ORAL | 5 refills | Status: DC

## 2021-05-23 MED ORDER — ATORVASTATIN CALCIUM 20 MG PO TABS: 20.0000 mg | ORAL_TABLET | Freq: Every day | ORAL | 3 refills | Status: DC

## 2021-05-23 NOTE — Discharge Instructions (Signed)
1) you need to take Eliquis/apixaban to reduce your risk for stroke due to irregular heartbeat called atrial fibrillation 2)Avoid ibuprofen/Advil/Aleve/Motrin/Goody Powders/Naproxen/BC powders/Meloxicam/Diclofenac/Indomethacin and other Nonsteroidal anti-inflammatory medications as these will make you more likely to bleed and can cause stomach ulcers, can also cause Kidney problems.  3)Very low-salt diet advised 4)Weigh yourself daily, call if you gain more than 3 pounds in 1 day or more than 5 pounds in 1 week as your diuretic medications may need to be adjusted 5)you need oxygen at home at 2 L via nasal cannula continuously while awake and while asleep, you will need up to 3 L/min via Nasal [place] of oxygen with activity-- smoking or having open fires around oxygen can cause fire, significant injury and death

## 2021-05-23 NOTE — Progress Notes (Signed)
ANTICOAGULATION CONSULT NOTE - Follow Up Consult  Pharmacy Consult for heparin >> ELIQUIS Indication: atrial fibrillation  No Known Allergies  Patient Measurements: Height: 5\' 4"  (162.6 cm) Weight: 104 kg (229 lb 3.2 oz) IBW/kg (Calculated) : 54.7 Heparin Dosing Weight: 75 kg  Vital Signs: Temp: 98.5 F (36.9 C) (07/17 0538) Temp Source: Oral (07/17 0538) BP: 132/76 (07/17 0538) Pulse Rate: 77 (07/17 0538)  Labs: Recent Labs    05/21/21 0944 05/21/21 1054 05/21/21 1904 05/22/21 0332 05/22/21 1239 05/22/21 1946 05/23/21 0509  HGB 13.9  --   --  13.6  --   --  13.2  HCT 43.3  --   --  42.6  --   --  41.9  PLT 254  --   --  232  --   --  222  HEPARINUNFRC  --   --    < > 0.95* 0.68 0.39 0.55  CREATININE 1.56*  --   --  1.15*  --   --   --   TROPONINIHS 22* 18*  --   --   --   --   --    < > = values in this interval not displayed.     Estimated Creatinine Clearance: 58.8 mL/min (A) (by C-G formula based on SCr of 1.15 mg/dL (H)).   Medications:  Scheduled:   amLODipine  10 mg Oral Daily   apixaban  5 mg Oral BID   aspirin  81 mg Oral Daily   atorvastatin  20 mg Oral QPM   furosemide  40 mg Intravenous BID   metoprolol tartrate  25 mg Oral BID   sodium chloride flush  3 mL Intravenous Q12H   sodium chloride flush  3 mL Intravenous Q12H   Infusions:   sodium chloride     heparin 1,200 Units/hr (05/22/21 2243)    Assessment: 65 y/o F on heparin for afib, switch to Eliquis on 7/17 per MD orders  Goal of Therapy:  Monitor platelets by anticoagulation protocol: Yes   Plan:  Eliquis 5mg  po BID, F/U for patient education Monitor CBC Monitor for bleeding  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

## 2021-05-23 NOTE — Progress Notes (Signed)
     SATURATION QUALIFICATIONS: (This note is used to comply with regulatory documentation for home oxygen)   Patient Saturations on Room Air at Rest = 86 %   Patient Saturations on Room Air while Ambulating = 78 %   Patient Saturations on 3 Liters of oxygen while Ambulating = 92 %     Patient needs continuous O2 at 2 L/min continuously via nasal cannula with humidifier at rest and up to 3L/min with activity, with gaseous portability and conserving device    Diagnosis -dCHF

## 2021-05-23 NOTE — Discharge Summary (Signed)
Tammy Small, is a 65 y.o. female  DOB 1956-06-05  MRN [id number].  Admission date:  05/21/2021  Admitting Physician  Mishel Sans Mariea Clonts, MD  Discharge Date:  05/23/2021   Primary MD  Jacquelin [facility], PA-C  Recommendations for primary care physician for things to follow:   1) you need to take Eliquis/apixaban to reduce your risk for stroke due to irregular heartbeat called atrial fibrillation 2)Avoid ibuprofen/Advil/Aleve/Motrin/Goody Powders/Naproxen/BC powders/Meloxicam/Diclofenac/Indomethacin and other Nonsteroidal anti-inflammatory medications as these will make you more likely to bleed and can cause stomach ulcers, can also cause Kidney problems.  3)Very low-salt diet advised 4)Weigh yourself daily, call if you gain more than 3 pounds in 1 day or more than 5 pounds in 1 week as your diuretic medications may need to be adjusted 5)you need oxygen at home at 2 L via nasal cannula continuously while awake and while asleep, you will need up to 3 L/min via Nasal canulla of oxygen with activity-- smoking or having open fires around oxygen can cause fire, significant injury and death   Admission Diagnosis  Cardiomegaly [I51.7] Dyspnea on exertion [R06.00] Exertional chest pain [R07.9] Pulmonary emboli (HCC) [I26.99] Dyspnea and respiratory abnormalities [R06.00, R06.89] Acute exacerbation of CHF (congestive heart failure) (HCC) [I50.9]   Discharge Diagnosis  Cardiomegaly [I51.7] Dyspnea on exertion [R06.00] Exertional chest pain [R07.9] Pulmonary emboli (HCC) [I26.99] Dyspnea and respiratory abnormalities [R06.00, R06.89] Acute exacerbation of CHF (congestive heart failure) (HCC) [I50.9]    Principal Problem:   Acute respiratory failure with hypoxia (HCC) Active Problems:   Dyspnea and respiratory abnormalities   Paroxysmal A-fib (HCC)   HTN (hypertension)   Obesity (BMI 30-39.9)   Acute exacerbation  of CHF (congestive heart failure) (HCC)      Past Medical History:  Diagnosis Date   Essential hypertension    Hyperlipidemia    Type 2 diabetes mellitus (HCC)     Past Surgical History:  Procedure Laterality Date   CESAREAN SECTION        HPI  from the history and physical done on the day of admission:    Tammy Small  is a 65 y.o. female who is a reformed smoker with a hx of HTN, hyperlipidemia, DM2, chronic diastolic HF, CKD III, moderate pericardial effusion, obesity, paroxysmal A. fib with prior ablation back in [place]- Complains of increased lower extremity edema, chest discomfort and dyspnea especially with activity -Patient apparently previously was on Xarelto but has not been on Xarelto lately----symptoms started about 5 days ago initially when she was trying to move some furniture No fever  Or chills   No Nausea, Vomiting or Diarrhea -Cardiology consult appreciated recommends IV heparin and IV diuresis -Chest x-ray with cardiomegaly otherwise no acute findings -Troponin is 22, repeat troponin is 18 -BNP elevated at 489 , no prior available for comparison -CBC WNL -Potassium is 3.4 creatinine 1.56--Baseline creatinine appears to be around 1.2     Hospital Course:    Brief Summary:- 65 y.o. female who is a reformed smoker with a hx of HTN,  hyperlipidemia, DM2, chronic diastolic HF, CKD III, moderate pericardial effusion, obesity, paroxysmal A. fib with prior ablation back in [place] admitted on 05/21/2021 with chest discomfort and dyspnea and found to have acute hypoxic respiratory failure    A/p  1)Acute hypoxic respiratory failure--- suspect secondary to fluid overload/-- --acute on chronic diastolic CHF exacerbation in the setting of severe pulmonary hypertension and moderate pericardial effusion -Echo from 05/21/2021 with EF of 70 to 75%, no regional wall motion abnormalities, severely elevated pulmonary artery systolic pressure noted -Echo also confirms  moderate pericardial effusion without tamponade --Chest x-ray with cardiomegaly otherwise no acute findings -Troponin is 22, repeat troponin is 18 -BNP elevated at 489 , no prior available for comparison -Given hypoxia, chest discomfort and elevated pulmonary artery pressures PE rule out done with  VQ scan and lower extremity Dopplers which are negative -Hypoxia resolved with diuresis---  -Cardiology consult appreciated -Overall improved with IV diuresis however hypoxia with activity persist -Patient was discharged home on home oxygen especially given pulmonary hypertension -Lasix 40 mg daily as advised   2)PAFiB--status post prior ablation in [place], unsuccessful -Cardiology consult appreciated -Discharged on Toprol-XL for rate control -CHA2DS2-VASc score is 4 -Treated with IV heparin for stroke prophylaxis, was discharged on p.o. Eliquis   3)HTN--BP soft, she will stop amlodipine, okay to give Toprol-XL and Lasix  -also stop  losartan   4)Morbid Obesity- -Low calorie diet, portion control and increase physical activity discussed with patient -Body mass index is 38.49 kg/m.   5)Hypokalemia-resolved with replacement  6)AKI----acute kidney injury on CKD stage - 3A --Baseline creatinine appears to be around 1.2 -Creatinine is back down to 1.1 from 1.56 on admission  renally adjust medications, avoid nephrotoxic agents / dehydration  / hypotension     Disposition/-Home with home oxygen   Dispo: The patient is from: Home              Anticipated d/c is to: Home            Code Status :  -  Code Status: Full Code    Family Communication:    NA (patient is alert, awake and coherent)   Consults  :  cardiology Discharge Condition: Stable  Follow UP--PCP within a week as advised  Diet and Activity recommendation:  As advised  Discharge Instructions    Discharge Instructions     (HEART FAILURE PATIENTS) Call MD:  Anytime you have any of the following symptoms: 1) 3 pound  weight gain in 24 hours or 5 pounds in 1 week 2) shortness of breath, with or without a dry hacking cough 3) swelling in the hands, feet or stomach 4) if you have to sleep on extra pillows at night in order to breathe.   Complete by: As directed    Call MD for:  difficulty breathing, headache or visual disturbances   Complete by: As directed    Call MD for:  persistant dizziness or light-headedness   Complete by: As directed    Call MD for:  temperature >100.4   Complete by: As directed    Diet - low sodium heart healthy   Complete by: As directed    Discharge instructions   Complete by: As directed    1) you need to take Eliquis/apixaban to reduce your risk for stroke due to irregular heartbeat called atrial fibrillation 2)Avoid ibuprofen/Advil/Aleve/Motrin/Goody Powders/Naproxen/BC powders/Meloxicam/Diclofenac/Indomethacin and other Nonsteroidal anti-inflammatory medications as these will make you more likely to bleed and can cause stomach ulcers, can also  cause Kidney problems.  3)Very low-salt diet advised 4)Weigh yourself daily, call if you gain more than 3 pounds in 1 day or more than 5 pounds in 1 week as your diuretic medications may need to be adjusted 5)you need oxygen at home at 2 L via nasal cannula continuously while awake and while asleep, you will need up to 3 L/min via Nasal canulla of oxygen with activity-- smoking or having open fires around oxygen can cause fire, significant injury and death   Increase activity slowly   Complete by: As directed        Discharge Medications     Allergies as of 05/23/2021   No Known Allergies      Medication List     STOP taking these medications    amLODipine 10 MG tablet Commonly known as: NORVASC   aspirin 81 MG chewable tablet   losartan 100 MG tablet Commonly known as: COZAAR   metoprolol tartrate 25 MG tablet Commonly known as: LOPRESSOR   VITAMIN B-2 PO       TAKE these medications    apixaban 5 MG Tabs  tablet Commonly known as: ELIQUIS Take 1 tablet (5 mg total) by mouth 2 (two) times daily.   atorvastatin 20 MG tablet Commonly known as: LIPITOR Take 1 tablet (20 mg total) by mouth daily.   B-12 PO Take 1 tablet by mouth daily.   furosemide 40 MG tablet Commonly known as: LASIX Take 1 tablet (40 mg total) by mouth daily. What changed:  medication strength how much to take   metoprolol succinate 25 MG 24 hr tablet Commonly known as: Toprol XL Take 1 tablet (25 mg total) by mouth daily.   OMEGA 3 PO Take 1 capsule by mouth daily.   VITAMIN D3 PO Take 1 tablet by mouth daily.               Durable Medical Equipment  (From admission, onward)           Start     Ordered   05/23/21 1425  For home use only DME oxygen  Once       Comments: SATURATION QUALIFICATIONS: (This note is used to comply with regulatory documentation for home oxygen)   Patient Saturations on Room Air at Rest = 86 %   Patient Saturations on Room Air while Ambulating = 78 %   Patient Saturations on 3 Liters of oxygen while Ambulating = 92 %      Patient needs continuous O2 at 2 L/min continuously via nasal cannula with humidifier at rest and up to 3L/min with activity, with gaseous portability and conserving device    Diagnosis -dCHF  Question Answer Comment  Length of Need Lifetime   Mode or (Route) Nasal cannula   Liters per Minute 3   Frequency Continuous (stationary and portable oxygen unit needed)   Oxygen conserving device Yes   Oxygen delivery system Gas      05/23/21 1427            Major procedures and Radiology Reports - PLEASE review detailed and final reports for all details, in brief -   NM Pulmonary Perfusion  Result Date: 05/21/2021 CLINICAL DATA:  Chest pain and shortness of breath. EXAM: NUCLEAR MEDICINE PERFUSION LUNG SCAN TECHNIQUE: Perfusion images were obtained in multiple projections after intravenous injection of radiopharmaceutical. Ventilation scans  intentionally deferred if perfusion scan and chest x-ray adequate for interpretation during COVID 19 epidemic. RADIOPHARMACEUTICALS:  4.4 mCi Tc-50m MAA IV  COMPARISON:  Single-view of the chest today. FINDINGS: Perfusion is normal without segmental or subsegmental defect. IMPRESSION: Negative for pulmonary embolus. Electronically Signed   By: Drusilla Kanner M.D.   On: 05/21/2021 13:28   US Venous Img Lower Bilateral (DVT)  Result Date: 05/21/2021 CLINICAL DATA:  Bilateral lower extremity edema EXAM: BILATERAL LOWER EXTREMITY VENOUS DOPPLER ULTRASOUND TECHNIQUE: Gray-scale sonography with compression, as well as color and duplex ultrasound, were performed to evaluate the deep venous system(s) from the level of the common femoral vein through the popliteal and proximal calf veins. COMPARISON:  None. FINDINGS: VENOUS Normal compressibility of the common femoral, superficial femoral, and popliteal veins, as well as the visualized calf veins. Visualized portions of profunda femoral vein and great saphenous vein unremarkable. No filling defects to suggest DVT on grayscale or color Doppler imaging. Doppler waveforms show normal direction of venous flow, normal respiratory plasticity and response to augmentation. Limited views of the contralateral common femoral vein are unremarkable. OTHER None. Limitations: none IMPRESSION: No lower extremity DVT Electronically Signed   By: Acquanetta Belling M.D.   On: 05/21/2021 12:39   DG Chest Port 1 View  Result Date: 05/21/2021 CLINICAL DATA:  65 year old female with right side chest pain radiating to the back. Shortness of breath. EXAM: PORTABLE CHEST 1 VIEW COMPARISON:  None. FINDINGS: Portable AP semi upright view at 1004 hours. Cardiomegaly. Other mediastinal contours are within normal limits. Hypoventilation at the left lung base appears most likely due to atelectasis. Elsewhere when allowing for portable technique the lungs are clear. No pneumothorax. No acute osseous  abnormality identified. Paucity of bowel gas in the upper abdomen. IMPRESSION: Cardiomegaly with associated lung base atelectasis. Electronically Signed   By: Odessa Fleming M.D.   On: 05/21/2021 10:34   ECHOCARDIOGRAM COMPLETE  Result Date: 05/21/2021    ECHOCARDIOGRAM REPORT   Patient Name:   Tammy Small Date of Exam: 05/21/2021 Medical Rec #:  [id number]    Height:       62.0 in Accession #:    [id number]   Weight:       229.1 lb Date of Birth:  June 20, 1956   BSA:          2.025 m Patient Age:    64 years     BP:           136/91 mmHg Patient Gender: F            HR:           74 bpm. [facility] Procedure: 2D Echo, Cardiac Doppler and Color Doppler Indications:    Abnormal ECG R94.31  History:        Patient has prior history of Echocardiogram examinations, most                 recent 09/08/2020. Arrythmias:Atrial Fibrillation; Risk                 Factors:Dyslipidemia, Diabetes, Hypertension and Former Smoker.                 Hx of pericardial effusion, chronic kidney disease stage IIIb.  Sonographer:    Celesta Gentile RCS Referring Phys: [phone] BRIAN MILLER IMPRESSIONS  1. Left ventricular ejection fraction, by estimation, is 70 to 75%. The left ventricle has hyperdynamic function. The left ventricle has no regional wall motion abnormalities. There is moderate left ventricular hypertrophy. Left ventricular diastolic parameters are indeterminate.  2. Right ventricular systolic function is low normal. The right  ventricular size is mildly enlarged. There is severely elevated pulmonary artery systolic pressure.  3. Left atrial size was mild to moderately dilated.  4. Right atrial size was mildly dilated.  5. Moderate pericardial effusion. The pericardial effusion is circumferential. There is no evidence of cardiac tamponade.  6. The mitral valve is normal in structure. Trivial mitral valve regurgitation. No evidence of mitral stenosis.  7. The aortic valve is tricuspid. There is mild calcification of the  aortic valve. There is mild thickening of the aortic valve. Aortic valve regurgitation is not visualized. No aortic stenosis is present.  8. The inferior vena cava is dilated in size with >50% respiratory variability, suggesting right atrial pressure of 8 mmHg. FINDINGS  Left Ventricle: Left ventricular ejection fraction, by estimation, is 70 to 75%. The left ventricle has hyperdynamic function. The left ventricle has no regional wall motion abnormalities. The left ventricular internal cavity size was normal in size. There is moderate left ventricular hypertrophy. Left ventricular diastolic parameters are indeterminate. Right Ventricle: The right ventricular size is mildly enlarged. Right vetricular wall thickness was not assessed. Right ventricular systolic function is low normal. There is severely elevated pulmonary artery systolic pressure. The tricuspid regurgitant velocity is 3.98 m/s, and with an assumed right atrial pressure of 8 mmHg, the estimated right ventricular systolic pressure is 71.4 mmHg. Left Atrium: Left atrial size was mild to moderately dilated. Right Atrium: Right atrial size was mildly dilated. Pericardium: A moderately sized pericardial effusion is present. The pericardial effusion is circumferential. There is no evidence of cardiac tamponade. Mitral Valve: The mitral valve is normal in structure. There is mild thickening of the mitral valve leaflet(s). There is mild calcification of the mitral valve leaflet(s). Mild mitral annular calcification. Trivial mitral valve regurgitation. No evidence  of mitral valve stenosis. Tricuspid Valve: The tricuspid valve is normal in structure. Tricuspid valve regurgitation is trivial. No evidence of tricuspid stenosis. Aortic Valve: The aortic valve is tricuspid. There is mild calcification of the aortic valve. There is mild thickening of the aortic valve. There is mild aortic valve annular calcification. Aortic valve regurgitation is not visualized. No  aortic stenosis  is present. Aortic valve mean gradient measures 4.0 mmHg. Aortic valve peak gradient measures 9.3 mmHg. Aortic valve area, by VTI measures 1.77 cm. Pulmonic Valve: The pulmonic valve was not well visualized. Pulmonic valve regurgitation is mild. No evidence of pulmonic stenosis. Aorta: The aortic root is normal in size and structure. Venous: The inferior vena cava is dilated in size with greater than 50% respiratory variability, suggesting right atrial pressure of 8 mmHg. IAS/Shunts: No atrial level shunt detected by color flow Doppler.  LEFT VENTRICLE PLAX 2D LVIDd:         3.70 cm LVIDs:         1.50 cm LV PW:         1.40 cm LV IVS:        1.40 cm LVOT diam:     1.80 cm LV SV:         51 LV SV Index:   25 LVOT Area:     2.54 cm  RIGHT VENTRICLE TAPSE (M-mode): 1.7 cm LEFT ATRIUM              Index       RIGHT ATRIUM           Index LA diam:        4.70 cm  2.32 cm/m  RA Area:  29.90 cm LA Vol (A2C):   102.0 ml 50.37 ml/m RA Volume:   95.80 ml  47.31 ml/m LA Vol (A4C):   81.1 ml  40.05 ml/m LA Biplane Vol: 92.8 ml  45.82 ml/m  AORTIC VALVE AV Area (Vmax):    1.82 cm AV Area (Vmean):   2.03 cm AV Area (VTI):     1.77 cm AV Vmax:           152.50 cm/s AV Vmean:          93.700 cm/s AV VTI:            0.290 m AV Peak Grad:      9.3 mmHg AV Mean Grad:      4.0 mmHg LVOT Vmax:         108.83 cm/s LVOT Vmean:        74.633 cm/s LVOT VTI:          0.202 m LVOT/AV VTI ratio: 0.70  AORTA Ao Root diam: 3.50 cm MITRAL VALVE                TRICUSPID VALVE MV Area (PHT): 4.12 cm     TR Peak grad:   63.4 mmHg MV Decel Time: 184 msec     TR Vmax:        398.00 cm/s MV E velocity: 122.00 cm/s                             SHUNTS                             Systemic VTI:  0.20 m                             Systemic Diam: 1.80 cm Dina Rich MD Electronically signed by Dina Rich MD Signature Date/Time: 05/21/2021/1:02:57 PM    Final     Micro Results  Recent Results (from the past 240  hour(s))  Resp Panel by RT-PCR (Flu A&B, Covid) Nasopharyngeal Swab     Status: None   Collection Time: 05/21/21  9:40 AM   Specimen: Nasopharyngeal Swab; Nasopharyngeal(NP) swabs in vial transport medium  Result Value Ref Range Status   SARS Coronavirus 2 by RT PCR NEGATIVE NEGATIVE Final    Comment: (NOTE) SARS-CoV-2 target nucleic acids are NOT DETECTED.  The SARS-CoV-2 RNA is generally detectable in upper respiratory specimens during the acute phase of infection. The lowest concentration of SARS-CoV-2 viral copies this assay can detect is 138 copies/mL. A negative result does not preclude SARS-Cov-2 infection and should not be used as the sole basis for treatment or other patient management decisions. A negative result may occur with  improper specimen collection/handling, submission of specimen other than nasopharyngeal swab, presence of viral mutation(s) within the areas targeted by this assay, and inadequate number of viral copies(<138 copies/mL). A negative result must be combined with clinical observations, patient history, and epidemiological information. The expected result is Negative.  Fact Sheet for Patients:  BloggerCourse.com  Fact Sheet for Healthcare Providers:  SeriousBroker.it  This test is no t yet approved or cleared by the [place] FDA and  has been authorized for detection and/or diagnosis of SARS-CoV-2 by FDA under an Emergency Use Authorization (EUA). This EUA will remain  in effect (meaning this test can be used) for the duration of the  COVID-19 declaration under Section 564(b)(1) of the Act, 21 U.S.C.section 360bbb-3(b)(1), unless the authorization is terminated  or revoked sooner.       Influenza A by PCR NEGATIVE NEGATIVE Final   Influenza B by PCR NEGATIVE NEGATIVE Final    Comment: (NOTE) The Xpert Xpress SARS-CoV-2/FLU/RSV plus assay is intended as an aid in the diagnosis of influenza from  Nasopharyngeal swab specimens and should not be used as a sole basis for treatment. Nasal washings and aspirates are unacceptable for Xpert Xpress SARS-CoV-2/FLU/RSV testing.  Fact Sheet for Patients: BloggerCourse.com  Fact Sheet for Healthcare Providers: SeriousBroker.it  This test is not yet approved or cleared by the [place] FDA and has been authorized for detection and/or diagnosis of SARS-CoV-2 by FDA under an Emergency Use Authorization (EUA). This EUA will remain in effect (meaning this test can be used) for the duration of the COVID-19 declaration under Section 564(b)(1) of the Act, 21 U.S.C. section 360bbb-3(b)(1), unless the authorization is terminated or revoked.  Performed at [facility], [address], [place], [place] [address]    Today   Subjective    Tammy Small today has no chest pains, no palpitations, no dizziness, patient does have some dyspnea on exertion and hypoxia with activity  -Patient Saturations on Room Air at Rest = 86 %   Patient Saturations on Room Air while Ambulating = 78 %   Patient Saturations on 3 Liters of oxygen while Ambulating = 92 %     Patient needs continuous O2 at 2 L/min continuously via nasal cannula with humidifier at rest and up to 3L/min with activity, with gaseous portability and conserving device      Patient has been seen and examined prior to discharge   Objective   Blood pressure 109/78, pulse 67, temperature 98.8 F (37.1 C), temperature source Oral, resp. rate 16, height 5\' 4"  (1.626 m), weight 104 kg, SpO2 (!) 87 %.   Intake/Output Summary (Last 24 hours) at 05/23/2021 1630 Last data filed at 05/23/2021 1300 Gross per 24 hour  Intake 1602.12 ml  Output --  Net 1602.12 ml   Exam Gen:- Awake Alert, in no acute distress  HEENT:- [place].AT, No sclera icterus Nose- [place] 2L/min Neck-Supple Neck,No JVD,. Lungs-no wheezing, air movement improved  CV- S1, S2  normal, irregularly irregular abd-  +ve B.Sounds, Abd Soft, No tenderness,    Extremity/Skin:-chronic Venous Stasis/pedal edema noted, intact peripheral pulses Psych-affect is appropriate, oriented x3 Neuro-no new focal deficits, no tremors   Data Review   CBC w Diff:  Lab Results  Component Value Date   WBC 7.5 05/23/2021   HGB 13.2 05/23/2021   HCT 41.9 05/23/2021   PLT 222 05/23/2021   LYMPHOPCT 29 05/21/2021   MONOPCT 16 05/21/2021   EOSPCT 3 05/21/2021   BASOPCT 1 05/21/2021    CMP:  Lab Results  Component Value Date   NA 141 05/22/2021   K 4.0 05/22/2021   CL 105 05/22/2021   CO2 29 05/22/2021   BUN 20 05/22/2021   CREATININE 1.15 (H) 05/22/2021   PROT 7.1 05/21/2021   ALBUMIN 4.3 05/21/2021   BILITOT 1.7 (H) 05/21/2021   ALKPHOS 67 05/21/2021   AST 20 05/21/2021   ALT 20 05/21/2021    Total Discharge time is about 33 minutes  05/23/2021 M.D on 05/23/2021 at 4:30 PM  Go to [email or web address] -  for contact info  [facility] - Office  [phone]

## 2021-05-23 NOTE — TOC Transition Note (Signed)
Transition of Care [facility]) - CM/SW Discharge Note   Patient Details  Name: Tammy Small MRN: [id number] Date of Birth: 06/26/1956  [facility]) CM/SW Contact:  Barry Brunner, LCSW Phone Number: 05/23/2021, 2:46 PM   Clinical Narrative:    CSW notified of patient's readiness for discharge and O2 needs. CSW observed patient did not have insurance. CSW referred patient to [facility] with [employer] for charity O2. Patient reported that she had applied to medicaid but was not sure what the status was. CSW notified financial counselor for Medicaid status follow up. TOC signing off.    Final next level of care: Home/Self Care Barriers to Discharge: Barriers Resolved   Patient Goals and CMS Choice Patient states their goals for this hospitalization and ongoing recovery are:: Return home CMS Medicare.gov Compare Post Acute Care list provided to:: Patient Choice offered to / list presented to : Patient  Discharge Placement                    Patient and family notified of of transfer: 05/23/21  Discharge Plan and Services                DME Arranged: Oxygen DME Agency: [employer] Date DME Agency Contacted: 05/23/21 Time DME Agency Contacted: 1446 Representative spoke with at DME Agency: Jasmine            Social Determinants of Health (SDOH) Interventions     Readmission Risk Interventions No flowsheet data found.

## 2021-05-24 ENCOUNTER — Encounter: Payer: Self-pay | Admitting: Physician Assistant

## 2021-05-24 ENCOUNTER — Ambulatory Visit: Payer: Medicaid Other | Admitting: Physician Assistant

## 2021-05-24 VITALS — BP 121/75 | HR 84 | Temp 98.3°F | Wt 229.0 lb

## 2021-05-24 DIAGNOSIS — I272 Pulmonary hypertension, unspecified: Secondary | ICD-10-CM

## 2021-05-24 DIAGNOSIS — I3139 Other pericardial effusion (noninflammatory): Secondary | ICD-10-CM

## 2021-05-24 DIAGNOSIS — E785 Hyperlipidemia, unspecified: Secondary | ICD-10-CM

## 2021-05-24 DIAGNOSIS — N189 Chronic kidney disease, unspecified: Secondary | ICD-10-CM

## 2021-05-24 DIAGNOSIS — I313 Pericardial effusion (noninflammatory): Secondary | ICD-10-CM

## 2021-05-24 DIAGNOSIS — I509 Heart failure, unspecified: Secondary | ICD-10-CM

## 2021-05-24 DIAGNOSIS — I10 Essential (primary) hypertension: Secondary | ICD-10-CM

## 2021-05-24 MED ORDER — APIXABAN 5 MG PO TABS: 5.0000 mg | ORAL_TABLET | Freq: Two times a day (BID) | ORAL | 3 refills | Status: DC

## 2021-05-24 MED ORDER — APIXABAN 5 MG PO TABS: 5.0000 mg | ORAL_TABLET | Freq: Two times a day (BID) | ORAL | 3 refills | Status: AC

## 2021-05-24 NOTE — Progress Notes (Signed)
BP 121/75   Pulse 84   Temp 98.3 F (36.8 C)   Wt 229 lb (103.9 kg)   SpO2 97% Comment: on 3L by [place]  BMI 39.31 kg/m    Subjective:    Patient ID: Tammy Small, female    DOB: 03-Jan-1956, 65 y.o.   MRN: [id number]  HPI: Tammy Small is a 65 y.o. female presenting on 05/24/2021 for No chief complaint on file.   HPI  Pt had a negative covid screening questionnaire.      Pt was seen during a system-wide IT outage; full Epic on laptop was unavailable but was able to view limited hospital records on mobile device through [facility].    Pt was in hospital over the weekend for cp, chf, cardiomegaly, DOE, CP.  She had VQ scan and LE dopplers which were negative.  Echo revealed severely elevated pulmonary pressures.   Pt has not picked up her apixiban since she just got out of hospital late yesterday.  She was sent home from the hospital on supplemental oxygen at 3L.  Pt says she is doing well today and feels good.   Relevant past medical, surgical, family and social history reviewed and updated as indicated. Interim medical history since our last visit reviewed. Allergies and medications reviewed and updated.    Current Outpatient Medications:    apixaban (ELIQUIS) 5 MG TABS tablet, Take 1 tablet (5 mg total) by mouth 2 (two) times daily., Disp: 180 tablet, Rfl: 3   atorvastatin (LIPITOR) 20 MG tablet, Take 1 tablet (20 mg total) by mouth daily., Disp: 30 tablet, Rfl: 3   Cholecalciferol (VITAMIN D3 PO), Take 1 tablet by mouth daily., Disp: , Rfl:    Cyanocobalamin (B-12 PO), Take 1 tablet by mouth daily., Disp: , Rfl:    furosemide (LASIX) 40 MG tablet, Take 1 tablet (40 mg total) by mouth daily., Disp: 90 tablet, Rfl: 3   metoprolol succinate (TOPROL XL) 25 MG 24 hr tablet, Take 1 tablet (25 mg total) by mouth daily., Disp: 30 tablet, Rfl: 11   Omega-3 Fatty Acids (OMEGA 3 PO), Take 1 capsule by mouth daily., Disp: , Rfl:     Review of Systems  Per HPI unless specifically  indicated above     Objective:    BP 121/75   Pulse 84   Temp 98.3 F (36.8 C)   Wt 229 lb (103.9 kg)   SpO2 97% Comment: on 3L by [place]  BMI 39.31 kg/m   Wt Readings from Last 3 Encounters:  05/24/21 229 lb (103.9 kg)  05/23/21 229 lb 3.2 oz (104 kg)  02/15/21 229 lb (103.9 kg)    Physical Exam Constitutional:      General: She is not in acute distress.    Appearance: She is not toxic-appearing.  HENT:     Head: Normocephalic and atraumatic.  Pulmonary:     Effort: No respiratory distress.     Breath sounds: No wheezing or rhonchi.     Comments: Pt is talking normally without having to stop for sob.  Musculoskeletal:     Right lower leg: Edema present.     Left lower leg: Edema present.  Skin:    General: Skin is warm and dry.  Neurological:     Mental Status: She is alert and oriented to person, place, and time.  Psychiatric:        Behavior: Behavior normal.           Assessment & Plan:  Encounter Diagnoses  Name Primary?   Chronic heart failure, unspecified heart failure type (HCC) Yes   Chronic kidney disease, unspecified CKD stage    Pericardial effusion    Pulmonary hypertension (HCC)    HTN (hypertension), malignant    Hyperlipidemia, unspecified hyperlipidemia type       -pt Will need follow up with cardiology which she last saw in October due to worsening pulmonary htn.    -Pt was given application for patient assistance for the eliquis.    -no changes today.  Pt to follow up in 1 month

## 2021-06-21 ENCOUNTER — Other Ambulatory Visit: Payer: Self-pay | Admitting: Physician Assistant

## 2021-06-21 DIAGNOSIS — E785 Hyperlipidemia, unspecified: Secondary | ICD-10-CM

## 2021-06-21 DIAGNOSIS — I10 Essential (primary) hypertension: Secondary | ICD-10-CM

## 2021-06-21 DIAGNOSIS — I509 Heart failure, unspecified: Secondary | ICD-10-CM

## 2021-06-21 DIAGNOSIS — N189 Chronic kidney disease, unspecified: Secondary | ICD-10-CM

## 2021-06-21 DIAGNOSIS — R7303 Prediabetes: Secondary | ICD-10-CM

## 2021-06-25 ENCOUNTER — Ambulatory Visit (INDEPENDENT_AMBULATORY_CARE_PROVIDER_SITE_OTHER): Payer: Self-pay | Admitting: Student

## 2021-06-25 ENCOUNTER — Other Ambulatory Visit: Payer: Self-pay

## 2021-06-25 ENCOUNTER — Encounter: Payer: Self-pay | Admitting: Student

## 2021-06-25 ENCOUNTER — Other Ambulatory Visit (HOSPITAL_COMMUNITY)
Admission: RE | Admit: 2021-06-25 | Discharge: 2021-06-25 | Disposition: A | Discharge status: Home / Self Care | Payer: Medicaid Other | Source: Ambulatory Visit | Attending: Physician Assistant | Admitting: Physician Assistant

## 2021-06-25 VITALS — BP 134/72 | HR 94 | Ht 64.0 in | Wt 229.4 lb

## 2021-06-25 DIAGNOSIS — I4819 Other persistent atrial fibrillation: Secondary | ICD-10-CM

## 2021-06-25 DIAGNOSIS — Z79899 Other long term (current) drug therapy: Secondary | ICD-10-CM

## 2021-06-25 DIAGNOSIS — I3139 Other pericardial effusion (noninflammatory): Secondary | ICD-10-CM

## 2021-06-25 DIAGNOSIS — I10 Essential (primary) hypertension: Secondary | ICD-10-CM | POA: Insufficient documentation

## 2021-06-25 DIAGNOSIS — I5189 Other ill-defined heart diseases: Secondary | ICD-10-CM

## 2021-06-25 DIAGNOSIS — G4730 Sleep apnea, unspecified: Secondary | ICD-10-CM

## 2021-06-25 DIAGNOSIS — R7303 Prediabetes: Secondary | ICD-10-CM | POA: Insufficient documentation

## 2021-06-25 DIAGNOSIS — I509 Heart failure, unspecified: Secondary | ICD-10-CM | POA: Insufficient documentation

## 2021-06-25 DIAGNOSIS — N1831 Chronic kidney disease, stage 3a: Secondary | ICD-10-CM

## 2021-06-25 DIAGNOSIS — I313 Pericardial effusion (noninflammatory): Secondary | ICD-10-CM

## 2021-06-25 DIAGNOSIS — N189 Chronic kidney disease, unspecified: Secondary | ICD-10-CM | POA: Insufficient documentation

## 2021-06-25 DIAGNOSIS — E785 Hyperlipidemia, unspecified: Secondary | ICD-10-CM | POA: Insufficient documentation

## 2021-06-25 LAB — COMPREHENSIVE METABOLIC PANEL
ALT: 37 U/L (ref 0–44)
AST: 30 U/L (ref 15–41)
Albumin: 3.8 g/dL (ref 3.5–5.0)
Alkaline Phosphatase: 76 U/L (ref 38–126)
Anion gap: 7 (ref 5–15)
BUN: 18 mg/dL (ref 8–23)
CO2: 29 mmol/L (ref 22–32)
Calcium: 8.8 mg/dL — ABNORMAL LOW (ref 8.9–10.3)
Chloride: 102 mmol/L (ref 98–111)
Creatinine, Ser: 1.24 mg/dL — ABNORMAL HIGH (ref 0.44–1.00)
GFR, Estimated: 49 mL/min — ABNORMAL LOW (ref >60)
Glucose, Bld: 138 mg/dL — ABNORMAL HIGH (ref 70–99)
Potassium: 3.4 mmol/L — ABNORMAL LOW (ref 3.5–5.1)
Sodium: 138 mmol/L (ref 135–145)
Total Bilirubin: 2.5 mg/dL — ABNORMAL HIGH (ref 0.3–1.2)
Total Protein: 6.3 g/dL — ABNORMAL LOW (ref 6.5–8.1)

## 2021-06-25 LAB — HEMOGLOBIN A1C
Hgb A1c MFr Bld: 6.2 % — ABNORMAL HIGH (ref 4.8–5.6)
Mean Plasma Glucose: 131.24 mg/dL

## 2021-06-25 LAB — LIPID PANEL
Cholesterol: 99 mg/dL (ref 0–200)
HDL: 31 mg/dL — ABNORMAL LOW (ref >40)
LDL Cholesterol: 46 mg/dL (ref 0–99)
Total CHOL/HDL Ratio: 3.2 RATIO
Triglycerides: 111 mg/dL (ref <150)
VLDL: 22 mg/dL (ref 0–40)

## 2021-06-25 MED ORDER — POTASSIUM CHLORIDE CRYS ER 20 MEQ PO TBCR: 20.0000 meq | EXTENDED_RELEASE_TABLET | Freq: Every day | ORAL | 3 refills | Status: DC

## 2021-06-25 MED ORDER — FUROSEMIDE 40 MG PO TABS: 60.0000 mg | ORAL_TABLET | Freq: Every day | ORAL | 3 refills | Status: DC

## 2021-06-25 NOTE — Progress Notes (Signed)
Cardiology Office Note    Date:  06/25/2021   ID:  Tammy Small, DOB November 03, 1956, MRN [id number]  PCP:  Jacquelin Hawking, PA-C  Cardiologist: Nona Dell, MD    Chief Complaint  Patient presents with   Hospitalization Follow-up    History of Present Illness:    Tammy Small is a 65 y.o. female with past medical history of HFpEF, persistent pericardial effusion, remote atrial fibrillation, HTN, HLD and Stage 3 CKD who presents to the office today for overdue hospital follow-up.  She was most recently admitted to [facility] in 05/2021 for acute hypoxic respiratory failure in the setting of a CHF exacerbation. Reported initially having some back pain after moving furniture but had started having worsening dyspnea on exertion and chest fullness for several days following this. Was volume overloaded by examination with BNP at 489. Hs Troponin values remained flat at 22 and 18. She did have a repeat echocardiogram which showed a preserved EF of 70 to 75% with no regional wall motion abnormalities. She did have severely elevated PASP, a moderate pericardial effusion with no evidence of tamponade and trivial MR. She responded well to IV Lasix and was discharged on Lasix 40 mg daily. She was in atrial fibrillation during admission but rates were well controlled and she was started on Eliquis for anticoagulation prior to discharge and continued on Toprol-XL 25 mg daily for rate control.  In talking with the patient today, she reports having progressive dyspnea on exertion over the past several months. Says she was prescribed supplemental oxygen following hospital discharge and uses this intermittently during the day and at night. She denies any specific orthopnea or PND. She does have chronic lower extremity edema which has been present since her teenage years per her report. Her weight is at 229 lbs on the office scales today and she reports her weight has been stable on her home scales without any  acute changes. No recent chest pain or palpitations.  She does report consuming a lot of water throughout the day. Tries to follow a low-sodium diet and does not add extra salt to her food.  Past Medical History:  Diagnosis Date   Essential hypertension    Hyperlipidemia    PAF (paroxysmal atrial fibrillation) (HCC)    Type 2 diabetes mellitus (HCC)     Past Surgical History:  Procedure Laterality Date   CESAREAN SECTION      Current Medications: Outpatient Medications Prior to Visit  Medication Sig Dispense Refill   apixaban (ELIQUIS) 5 MG TABS tablet Take 1 tablet (5 mg total) by mouth 2 (two) times daily. 180 tablet 3   atorvastatin (LIPITOR) 20 MG tablet Take 1 tablet (20 mg total) by mouth daily. 30 tablet 3   Cholecalciferol (VITAMIN D3 PO) Take 1 tablet by mouth daily.     metoprolol succinate (TOPROL XL) 25 MG 24 hr tablet Take 1 tablet (25 mg total) by mouth daily. 30 tablet 11   Omega-3 Fatty Acids (OMEGA 3 PO) Take 1 capsule by mouth daily.     furosemide (LASIX) 40 MG tablet Take 1 tablet (40 mg total) by mouth daily. 90 tablet 3   Cyanocobalamin (B-12 PO) Take 1 tablet by mouth daily. (Patient not taking: Reported on 06/25/2021)     No facility-administered medications prior to visit.     Allergies:   Patient has no known allergies.   Social History   Socioeconomic History   Marital status: Single    Spouse name: Not  on file   Number of children: Not on file   Years of education: Not on file   Highest education level: Not on file  Occupational History   Not on file  Tobacco Use   Smoking status: Former    Packs/day: 0.50    Years: 3.00    Pack years: 1.50    Types: Cigarettes    Quit date: 11/07/1972    Years since quitting: 48.6   Smokeless tobacco: Never  Vaping Use   Vaping Use: Never used  Substance and Sexual Activity   Alcohol use: Not Currently    Comment: last use 1974   Drug use: Not Currently    Types: Cocaine    Comment: last use 1974    Sexual activity: Not on file  Other Topics Concern   Not on file  Social History Narrative   ** Merged History Encounter **       Social Determinants of Health   Financial Resource Strain: Not on file  Food Insecurity: Not on file  Transportation Needs: Not on file  Physical Activity: Not on file  Stress: Not on file  Social Connections: Not on file     Family History:  The patient's family history includes Diabetes in her father; Hypertension in her father.   Review of Systems:    Please see the history of present illness.     All other systems reviewed and are otherwise negative except as noted above.   Physical Exam:    VS:  BP 134/72   Pulse 94   Ht 5\' 4"  (1.626 m)   Wt 229 lb 6.4 oz (104.1 kg)   SpO2 95%   BMI 39.38 kg/m    General: Pleasant overweight female appearing in no acute distress. Head: Normocephalic, atraumatic. Neck: No carotid bruits. JVD not elevated.  Lungs: Respirations regular and unlabored, without wheezes or rales.  Heart: Irregularly irregular. No S3 or S4.  No murmur, no rubs, or gallops appreciated. Abdomen: Appears non-distended. No obvious abdominal masses. Msk:  Strength and tone appear normal for age. No obvious joint deformities or effusions. Extremities: No clubbing or cyanosis. Chronic appearing pitting edema.  Distal pedal pulses are 2+ bilaterally. Neuro: Alert and oriented X 3. Moves all extremities spontaneously. No focal deficits noted. Psych:  Responds to questions appropriately with a normal affect. Skin: No rashes or lesions noted  Wt Readings from Last 3 Encounters:  06/25/21 229 lb 6.4 oz (104.1 kg)  05/24/21 229 lb (103.9 kg)  05/23/21 229 lb 3.2 oz (104 kg)     Studies/Labs Reviewed:   EKG:  EKG is not ordered today.    Recent Labs: 05/21/2021: B Natriuretic Peptide 489.0; Magnesium 1.9 05/23/2021: Hemoglobin 13.2; Platelets 222 06/25/2021: ALT 37; BUN 18; Creatinine, Ser 1.24; Potassium 3.4; Sodium 138   Lipid  Panel    Component Value Date/Time   CHOL 99 06/25/2021 1220   TRIG 111 06/25/2021 1220   HDL 31 (L) 06/25/2021 1220   CHOLHDL 3.2 06/25/2021 1220   VLDL 22 06/25/2021 1220   LDLCALC 46 06/25/2021 1220    Additional studies/ records that were reviewed today include:   Echocardiogram: 05/2021 IMPRESSIONS     1. Left ventricular ejection fraction, by estimation, is 70 to 75%. The  left ventricle has hyperdynamic function. The left ventricle has no  regional wall motion abnormalities. There is moderate left ventricular  hypertrophy. Left ventricular diastolic  parameters are indeterminate.   2. Right ventricular systolic function is low  normal. The right  ventricular size is mildly enlarged. There is severely elevated pulmonary  artery systolic pressure.   3. Left atrial size was mild to moderately dilated.   4. Right atrial size was mildly dilated.   5. Moderate pericardial effusion. The pericardial effusion is  circumferential. There is no evidence of cardiac tamponade.   6. The mitral valve is normal in structure. Trivial mitral valve  regurgitation. No evidence of mitral stenosis.   7. The aortic valve is tricuspid. There is mild calcification of the  aortic valve. There is mild thickening of the aortic valve. Aortic valve  regurgitation is not visualized. No aortic stenosis is present.   8. The inferior vena cava is dilated in size with >50% respiratory  variability, suggesting right atrial pressure of 8 mmHg.   Assessment:    1. Diastolic dysfunction   2. Pericardial effusion   3. Persistent atrial fibrillation (HCC)   4. Sleep-disordered breathing   5. Medication management   6. Stage 3a chronic kidney disease (HCC)      Plan:   In order of problems listed above:  1. HFpEF - She reports baseline dyspnea on exertion but denies any orthopnea or PND. She does have chronic appearing edema on examination today. She reports good compliance with Lasix 40 mg daily and  given stable renal function by repeat labs, I did recommend titrating this to 60 mg daily. Will start K-dur 20 mEq daily and obtain a repeat BMET in 2 weeks.   2. Pericardial Effusion - She had a moderate pericardial effusion by echocardiogram in 09/2020 and this remained moderate by repeat echocardiogram in 05/2021. Will plan to titrate diuretic therapy as outlined above and can consider a repeat echocardiogram in 2 to 3 months for reassessment  3. Persistent Atrial Fibrillation - She mentions what sounds like a prior ablation 5+ years ago but was diagnosed with recurrence in 05/2021. She denies any recent palpitations and heart rate was recorded in the 90's upon arrival. We did check ambulatory heart rate and oxygen saturations today and her heart rate went into the 110's with activity but she denied any associated palpitations and her heart rate quickly returned into the 80's with sitting. Her oxygen saturations did drop to 87% and she did not have her submental oxygen with her. She was encouraged to use this as needed at home. Will continue Toprol-XL 25 mg daily for now and this can be further titrated in the future if needed.  - She denies any evidence of active bleeding. Remains on Eliquis 5 mg twice daily for anticoagulation.  4. Stage 3 CKD - Her creatinine peaked at 1.56 during her recent admission and had improved to 1.15 at the time of discharge. Was overall stable at 1.24 when checked earlier today. Her K+ was low at 3.4 today and will plan to start potassium segmentation as outlined above.  5. Sleep-Disordered Breathing - Recent echocardiogram showed severely elevated PASP and she is at risk for OSA given her obesity and atrial fibrillation. Also had nocturnal pauses during her recent admission. Will refer to Pulmonology for consideration of a sleep study (thinks she had one 10-15+ years ago but no recent testing).    Medication Adjustments/Labs and Tests Ordered: Current medicines are  reviewed at length with the patient today.  Concerns regarding medicines are outlined above.  Medication changes, Labs and Tests ordered today are listed in the Patient Instructions below. Patient Instructions  Medication Instructions:  Your physician has recommended you  make the following change in your medication:  INCREASE Lasix to 60 mg daily START K-Dur 20 mEq tablets daily  *If you need a refill on your cardiac medications before your next appointment, please call your pharmacy*   Lab Work: BMET- 2 Weeks If you have labs (blood work) drawn today and your tests are completely normal, you will receive your results only by: MyChart Message (if you have MyChart) OR A paper copy in the mail If you have any lab test that is abnormal or we need to change your treatment, we will call you to review the results.   Testing/Procedures: None   Follow-Up: At [facility], you and your health needs are our priority.  As part of our continuing mission to provide you with exceptional heart care, we have created designated Provider Care Teams.  These Care Teams include your primary Cardiologist (physician) and Advanced Practice Providers (APPs -  Physician Assistants and Nurse Practitioners) who all work together to provide you with the care you need, when you need it.  We recommend signing up for the patient portal called "MyChart".  Sign up information is provided on this After Visit Summary.  MyChart is used to connect with patients for Virtual Visits (Telemedicine).  Patients are able to view lab/test results, encounter notes, upcoming appointments, etc.  Non-urgent messages can be sent to your provider as well.   To learn more about what you can do with MyChart, go to [email or web address].    Your next appointment:   6-8 week(s)  The format for your next appointment:   In Person  Provider:   You may see Nona DellSamuel McDowell, MD or one of the following Advanced Practice Providers on your  designated Care Team:   Randall AnBrittany Faiga Stones, PA-C  Jacolyn ReedyMichele Lenze, PA-C    Other Instructions You have been referred to [facility], Dr. Vassie LollAlva. They will contact you with your first appointment.    Signed, Ellsworth LennoxBrittany M Matsue Strom, PA-C  06/25/2021 5:10 PM    [facility] [address] [place], [place] [address] Phone: [phone] Fax: [phone]

## 2021-06-25 NOTE — Patient Instructions (Signed)
Medication Instructions:  Your physician has recommended you make the following change in your medication:  INCREASE Lasix to 60 mg daily START K-Dur 20 mEq tablets daily  *If you need a refill on your cardiac medications before your next appointment, please call your pharmacy*   Lab Work: BMET- 2 Weeks If you have labs (blood work) drawn today and your tests are completely normal, you will receive your results only by: MyChart Message (if you have MyChart) OR A paper copy in the mail If you have any lab test that is abnormal or we need to change your treatment, we will call you to review the results.   Testing/Procedures: None   Follow-Up: At [facility], you and your health needs are our priority.  As part of our continuing mission to provide you with exceptional heart care, we have created designated Provider Care Teams.  These Care Teams include your primary Cardiologist (physician) and Advanced Practice Providers (APPs -  Physician Assistants and Nurse Practitioners) who all work together to provide you with the care you need, when you need it.  We recommend signing up for the patient portal called "MyChart".  Sign up information is provided on this After Visit Summary.  MyChart is used to connect with patients for Virtual Visits (Telemedicine).  Patients are able to view lab/test results, encounter notes, upcoming appointments, etc.  Non-urgent messages can be sent to your provider as well.   To learn more about what you can do with MyChart, go to [email or web address].    Your next appointment:   6-8 week(s)  The format for your next appointment:   In Person  Provider:   You may see Nona Dell, MD or one of the following Advanced Practice Providers on your designated Care Team:   Randall An, PA-C  Jacolyn Reedy, PA-C    Other Instructions You have been referred to [facility] Pulmonology, Dr. Vassie Loll. They will contact you with your first appointment.

## 2021-06-28 ENCOUNTER — Encounter: Payer: Self-pay | Admitting: Physician Assistant

## 2021-06-28 ENCOUNTER — Ambulatory Visit: Payer: Medicaid Other | Admitting: Physician Assistant

## 2021-06-28 DIAGNOSIS — I3139 Other pericardial effusion (noninflammatory): Secondary | ICD-10-CM

## 2021-06-28 DIAGNOSIS — I509 Heart failure, unspecified: Secondary | ICD-10-CM

## 2021-06-28 DIAGNOSIS — N189 Chronic kidney disease, unspecified: Secondary | ICD-10-CM

## 2021-06-28 DIAGNOSIS — I10 Essential (primary) hypertension: Secondary | ICD-10-CM

## 2021-06-28 DIAGNOSIS — E785 Hyperlipidemia, unspecified: Secondary | ICD-10-CM

## 2021-06-28 DIAGNOSIS — I313 Pericardial effusion (noninflammatory): Secondary | ICD-10-CM

## 2021-06-28 DIAGNOSIS — R7303 Prediabetes: Secondary | ICD-10-CM

## 2021-06-28 NOTE — Progress Notes (Signed)
There were no vitals taken for this visit.   Subjective:    Patient ID: Tammy Small, female    DOB: May 30, 1956, 65 y.o.   MRN: [id number]  HPI: Tammy Small is a 65 y.o. female presenting on 06/28/2021 for No chief complaint on file.   HPI  This is a telemedicine appointment through [employer].  I connected with  Tammy Small on 06/28/21 by a video enabled telemedicine application and verified that I am speaking with the correct person using two identifiers.   I discussed the limitations of evaluation and management by telemedicine. The patient expressed understanding and agreed to proceed.  Pt is at home.  Provider is at office.       Pt was given option for in-person appointment or virtual and she selected virtual.    Pt is a pleasant 64yoF with HTN, dyslipidemia, prediabetes, CKD, HFpEF, chronic pericardial effusion and atrial fibrillation.    She monitors her BP at home.  BP 125/78 yesterday.  Her weight 222 today.  Pt was seen by cardiology on 06/25/21 for hospital follow up.  Pt inquires about weight loss and discussed that it is Likely due to increase in lasix made by cardiology last week.   K+ was also prescribed at that appointment.   She says the oxygen company is supposed to install it in her home today.   She uses it when she gets sob which she says she gets with exertion.  Her O2 sat decreased to 87% on exertion in the cardiology office 06/25/21.  She does not have a saturation meter but is planning to get one.  She says cardiology referred her for a sleep study to evaluate for OSA  Pt says Her [employer] financial assistance is in [employer].  Pt says she is doing well this morning and is having no complaints.      Relevant past medical, surgical, family and social history reviewed and updated as indicated. Interim medical history since our last visit reviewed. Allergies and medications reviewed and updated.   Current Outpatient Medications:    apixaban  (ELIQUIS) 5 MG TABS tablet, Take 1 tablet (5 mg total) by mouth 2 (two) times daily., Disp: 180 tablet, Rfl: 3   atorvastatin (LIPITOR) 20 MG tablet, Take 1 tablet (20 mg total) by mouth daily., Disp: 30 tablet, Rfl: 3   Cholecalciferol (VITAMIN D3 PO), Take 1 tablet by mouth daily., Disp: , Rfl:    furosemide (LASIX) 40 MG tablet, Take 1.5 tablets (60 mg total) by mouth daily., Disp: 135 tablet, Rfl: 3   metoprolol succinate (TOPROL XL) 25 MG 24 hr tablet, Take 1 tablet (25 mg total) by mouth daily., Disp: 30 tablet, Rfl: 11   Omega-3 Fatty Acids (OMEGA 3 PO), Take 1 capsule by mouth daily., Disp: , Rfl:    Cyanocobalamin (B-12 PO), Take 1 tablet by mouth daily. (Patient not taking: Reported on 06/28/2021), Disp: , Rfl:    potassium chloride SA (KLOR-CON) 20 MEQ tablet, Take 1 tablet (20 mEq total) by mouth daily. (Patient not taking: Reported on 06/28/2021), Disp: 90 tablet, Rfl: 3    Review of Systems  Per HPI unless specifically indicated above     Objective:    There were no vitals taken for this visit.  Wt Readings from Last 3 Encounters:  06/25/21 229 lb 6.4 oz (104.1 kg)  05/24/21 229 lb (103.9 kg)  05/23/21 229 lb 3.2 oz (104 kg)    Physical Exam Constitutional:  General: She is not in acute distress.    Appearance: She is not toxic-appearing.  HENT:     Head: Normocephalic and atraumatic.  Pulmonary:     Effort: No respiratory distress.     Comments: Pt is talking in complete sentences without dyspnea Neurological:     Mental Status: She is alert and oriented to person, place, and time.  Psychiatric:        Attention and Perception: Attention normal.        Speech: Speech normal.        Behavior: Behavior is cooperative.    Results for orders placed or performed during the hospital encounter of 06/25/21  Lipid panel  Result Value Ref Range   Cholesterol 99 0 - 200 mg/dL   Triglycerides 322 <025 mg/dL   HDL 31 (L) >42 mg/dL   Total CHOL/HDL Ratio 3.2 RATIO    VLDL 22 0 - 40 mg/dL   LDL Cholesterol 46 0 - 99 mg/dL  Comprehensive metabolic panel  Result Value Ref Range   Sodium 138 135 - 145 mmol/L   Potassium 3.4 (L) 3.5 - 5.1 mmol/L   Chloride 102 98 - 111 mmol/L   CO2 29 22 - 32 mmol/L   Glucose, Bld 138 (H) 70 - 99 mg/dL   BUN 18 8 - 23 mg/dL   Creatinine, Ser 7.06 (H) 0.44 - 1.00 mg/dL   Calcium 8.8 (L) 8.9 - 10.3 mg/dL   Total Protein 6.3 (L) 6.5 - 8.1 g/dL   Albumin 3.8 3.5 - 5.0 g/dL   AST 30 15 - 41 U/L   ALT 37 0 - 44 U/L   Alkaline Phosphatase 76 38 - 126 U/L   Total Bilirubin 2.5 (H) 0.3 - 1.2 mg/dL   GFR, Estimated 49 (L) >60 mL/min   Anion gap 7 5 - 15  Hemoglobin A1c  Result Value Ref Range   Hgb A1c MFr Bld 6.2 (H) 4.8 - 5.6 %   Mean Plasma Glucose 131.24 mg/dL      Assessment & Plan:     Encounter Diagnoses  Name Primary?   Chronic heart failure, unspecified heart failure type (HCC) Yes   Chronic kidney disease, unspecified CKD stage    HTN (hypertension), malignant    Hyperlipidemia, unspecified hyperlipidemia type    Prediabetes    Pericardial effusion      -reviewed labs with pt  -pt to get labs in 2 wk per cardiology -no changes today -pt is encouraged to continue to monitor BP and weight -pt to follow up in 3 months.  She is to contact office sooner prn

## 2021-06-30 ENCOUNTER — Other Ambulatory Visit: Payer: Self-pay | Admitting: Physician Assistant

## 2021-06-30 MED ORDER — POTASSIUM CHLORIDE CRYS ER 20 MEQ PO TBCR
20.0000 meq | EXTENDED_RELEASE_TABLET | Freq: Every day | ORAL | 0 refills | Status: DC
Start: 2021-06-30 — End: 2021-09-20

## 2021-06-30 MED ORDER — ATORVASTATIN CALCIUM 20 MG PO TABS: 20.0000 mg | ORAL_TABLET | Freq: Every day | ORAL | 0 refills | Status: DC

## 2021-06-30 MED ORDER — FUROSEMIDE 40 MG PO TABS: 60.0000 mg | ORAL_TABLET | Freq: Every day | ORAL | 0 refills | Status: DC

## 2021-06-30 MED ORDER — METOPROLOL SUCCINATE ER 25 MG PO TB24: 25.0000 mg | ORAL_TABLET | Freq: Every day | ORAL | 0 refills | Status: DC

## 2021-07-09 ENCOUNTER — Other Ambulatory Visit (HOSPITAL_COMMUNITY)
Admission: RE | Admit: 2021-07-09 | Discharge: 2021-07-09 | Disposition: A | Discharge status: Home / Self Care | Payer: Medicaid Other | Source: Ambulatory Visit | Attending: Student | Admitting: Student

## 2021-07-09 ENCOUNTER — Other Ambulatory Visit: Payer: Self-pay

## 2021-07-09 DIAGNOSIS — Z79899 Other long term (current) drug therapy: Secondary | ICD-10-CM | POA: Insufficient documentation

## 2021-07-09 LAB — BASIC METABOLIC PANEL
Anion gap: 10 (ref 5–15)
BUN: 16 mg/dL (ref 8–23)
CO2: 26 mmol/L (ref 22–32)
Calcium: 9.4 mg/dL (ref 8.9–10.3)
Chloride: 104 mmol/L (ref 98–111)
Creatinine, Ser: 1.27 mg/dL — ABNORMAL HIGH (ref 0.44–1.00)
GFR, Estimated: 48 mL/min — ABNORMAL LOW (ref >60)
Glucose, Bld: 123 mg/dL — ABNORMAL HIGH (ref 70–99)
Potassium: 3.6 mmol/L (ref 3.5–5.1)
Sodium: 140 mmol/L (ref 135–145)

## 2021-08-12 ENCOUNTER — Telehealth: Payer: Self-pay

## 2021-08-12 NOTE — Telephone Encounter (Addendum)
Call from pt requesting refill of Eliquis, returned call to pt, informed for refills she would need to contact number that is on bottle. Pt understood

## 2021-08-18 ENCOUNTER — Telehealth: Payer: Self-pay

## 2021-08-18 NOTE — Telephone Encounter (Signed)
Second attempt to reach pt at both numbers for medication pick up, left vm for call back.

## 2021-08-25 ENCOUNTER — Ambulatory Visit (INDEPENDENT_AMBULATORY_CARE_PROVIDER_SITE_OTHER): Payer: Self-pay | Admitting: Pulmonary Disease

## 2021-08-25 ENCOUNTER — Encounter: Payer: Self-pay | Admitting: Pulmonary Disease

## 2021-08-25 ENCOUNTER — Other Ambulatory Visit: Payer: Self-pay

## 2021-08-25 VITALS — BP 138/80 | HR 84 | Temp 98.0°F | Ht 64.0 in | Wt 219.2 lb

## 2021-08-25 DIAGNOSIS — I3139 Other pericardial effusion (noninflammatory): Secondary | ICD-10-CM | POA: Insufficient documentation

## 2021-08-25 DIAGNOSIS — R0683 Snoring: Secondary | ICD-10-CM

## 2021-08-25 DIAGNOSIS — R0600 Dyspnea, unspecified: Secondary | ICD-10-CM

## 2021-08-25 DIAGNOSIS — R0689 Other abnormalities of breathing: Secondary | ICD-10-CM

## 2021-08-25 DIAGNOSIS — J9611 Chronic respiratory failure with hypoxia: Secondary | ICD-10-CM | POA: Insufficient documentation

## 2021-08-25 NOTE — Assessment & Plan Note (Signed)
This seems to be on the basis of diastolic heart failure and pericardial effusion.  Tammy Small has a remote history of smoking so I do not think that Tammy Small has airway disease.  There is no cough or wheezing.  Chest x-ray independently reviewed shows cardiomegaly and small effusions and no evidence of ILD.  Pulmonary hypertension was noted but that also appears to be secondary to cardiac issues. Pulm embolism was ruled out by negative VQ scan. Tammy Small would qualify for oxygen but this is too expensive for her I have asked her to recontact [place] when Tammy Small obtains insurance and we can try to get her oxygen again

## 2021-08-25 NOTE — Assessment & Plan Note (Signed)
No signs of tamponade but clearly this seems to be the main issue and has been persistent since 05/2020. We will check serology including ANA and BNP but she does not have any signs of systemic inflammation.  She may benefit from anti-inflammatory such as colchicine, will defer to cardiology

## 2021-08-25 NOTE — Addendum Note (Signed)
Addended by: Carleene Mains D on: 08/25/2021 10:21 AM   Modules accepted: Orders

## 2021-08-25 NOTE — Progress Notes (Signed)
Subjective:    Patient ID: Tammy Small, female    DOB: [birth year], 65 y.o.   MRN: [id number]  HPI  Chief Complaint  Patient presents with   Consult    Hosp stay for CHF in July since then patient feels like her "lungs are hurting." Her SOB with exertion has worsened since hosp stay as well. Patient used oxygen from July until last week. Patient sent oxygen back. Pt was using 3L O2. Patients O2 sats were at 89% after walking from lobby to room on room air. Recovered to 92% while sitting   Suspected sleep apnea from [place]. Patient says she is unsure why she is suspected to have sleep apnea. Daughter says she snores per pt.    47 year old remote smoker presents for evaluation of shortness of breath and hypoxia. I have reviewed her hospital discharge summary and cardiology consultation.  She seems to have a pericardial effusion of moderate degree dating back to echo from 05/2020 without evidence of tamponade She reports shortness of breath for a few months.  She was admitted to [facility] 05/2021 for acute hypoxic respiratory failure, treated for CHF exacerbation and discharged on oxygen.  She had a follow-up visit with cardiology and was again noted to desaturate on walking.  Unfortunately she has no insurance and could not afford the oxygen and turned this back in a few weeks ago She was diuresed and is maintained on Lasix 60 mg.  Her weight is down from a peak of 275 pounds a couple of years ago to her current weight of 2 1 9  pounds.  BNP was 489 during hospital admission, creatinine on discharge was 16/1.3 on 9/2 She reports shortness of breath on minimal exertion, NYHA class II-III symptoms, she also states that her lungs hurt when she breathes and points to her right flank.  Leaning forward seems to relieve this  She is also referred for an evaluation of sleep disordered breathing.  Epworth sleepiness score is 16 and she reports sleepiness as a passenger in a car lying down to  rest in the afternoons, watching TV, sitting quietly after lunch. Bedtime is between 11 PM and 1 AM, sleep latency is minimal, she sleeps on her right side with 2 pillows, reports 1-2 nocturnal awakenings including nocturia and is out of bed between 8 and 10 AM feeling rested without dryness of mouth or headaches. There is no history suggestive of cataplexy, sleep paralysis or parasomnias No bed partner history is available since she lives alone   PMH -  HFpEF, persistent pericardial effusion, remote atrial fibrillation, HTN, HLD and Stage 3 CKD    [place] lived and worked [place] life and moved to [place] a year ago.  Her last job was for the [employer] as an [employer] for 3 years  Significant tests/ events reviewed  05/2021 echo showed a preserved EF of 70 to 75% with no regional wall motion abnormalities. She did have severely elevated PASP, a moderate pericardial effusion with no evidence of tamponade   05/2021 VQ scan negative 05/2021 duplex negative for DVT left lower extremity   Past Medical History:  Diagnosis Date   Essential hypertension    Hyperlipidemia    PAF (paroxysmal atrial fibrillation) (HCC)    Type 2 diabetes mellitus (HCC)    Past Surgical History:  Procedure Laterality Date   CESAREAN SECTION      No Known Allergies  Social History   Socioeconomic History   Marital  status: Single    Spouse name: Not on file   Number of children: Not on file   Years of education: Not on file   Highest education level: Not on file  Occupational History   Not on file  Tobacco Use   Smoking status: Former    Packs/day: 0.50    Years: 3.00    Pack years: 1.50    Types: Cigarettes    Quit date: 11/07/1972    Years since quitting: 48.8   Smokeless tobacco: Never  Vaping Use   Vaping Use: Never used  Substance and Sexual Activity   Alcohol use: Not Currently    Comment: last use 1974   Drug use: Not Currently    Types: Cocaine    Comment:  last use 1974   Sexual activity: Not on file  Other Topics Concern   Not on file  Social History Narrative   ** Merged History Encounter **       Social Determinants of Health   Financial Resource Strain: Not on file  Food Insecurity: Not on file  Transportation Needs: Not on file  Physical Activity: Not on file  Stress: Not on file  Social Connections: Not on file  Intimate Partner Violence: Not on file     Family History  Problem Relation Age of Onset   Hypertension Father    Diabetes Father      Review of Systems   Constitutional: negative for anorexia, fevers and sweats  Eyes: negative for irritation, redness and visual disturbance  Ears, nose, mouth, throat, and face: negative for earaches, epistaxis, nasal congestion and sore throat  Respiratory: negative for cough, sputum and wheezing  Cardiovascular: negative for chest pain,  orthopnea, palpitations and syncope  Gastrointestinal: negative for abdominal pain, constipation, diarrhea, melena, nausea and vomiting  Genitourinary:negative for dysuria, frequency and hematuria  Hematologic/lymphatic: negative for bleeding, easy bruising and lymphadenopathy  Musculoskeletal:negative for arthralgias, muscle weakness and stiff joints  Neurological: negative for coordination problems, gait problems, headaches and weakness  Endocrine: negative for diabetic symptoms including polydipsia, polyuria and weight loss     Objective:   Physical Exam  Gen. Pleasant, obese, in no distress, normal affect ENT - no pallor,icterus, no post nasal drip, class 2 airway Neck: No JVD, no thyromegaly, no carotid bruits Lungs: no use of accessory muscles, no dullness to percussion, decreased without rales or rhonchi  Cardiovascular: Rhythm regular, heart sounds  normal, no murmurs or gallops, 2+ peripheral edema Abdomen: soft and non-tender, no hepatosplenomegaly, BS normal. Musculoskeletal: No deformities, no cyanosis or clubbing Neuro:   alert, non focal, no tremors       Assessment & Plan:     Hypersomnolence and snoring -reasonable to test her for OSA with a home sleep test.  I am sure we will see nocturnal desaturations on the study  Pretest probability is intermediate.  The pathophysiology of obstructive sleep apnea , it's cardiovascular consequences & modes of treatment including CPAP were discused with the patient in detail & they evidenced understanding.

## 2021-08-25 NOTE — Patient Instructions (Signed)
  Blood work today - BMET, ANA, CCP, ESR Home sleep test  Check with your heart doctors about fluid around your heart

## 2021-08-26 LAB — ANA: Anti Nuclear Antibody (ANA): POSITIVE — AB

## 2021-08-26 LAB — BRAIN NATRIURETIC PEPTIDE: BNP: 814.2 pg/mL — ABNORMAL HIGH (ref 0.0–100.0)

## 2021-08-26 LAB — SEDIMENTATION RATE: Sed Rate: 2 mm/hr (ref 0–40)

## 2021-08-26 NOTE — Progress Notes (Signed)
Cardiology Office Note    Date:  08/28/2021   ID:  Tammy Small, DOB 1956-04-12, MRN [id number]  PCP:  Jacquelin Hawking, PA-C  Cardiologist: Nona Dell, MD    Chief Complaint  Patient presents with   Follow-up    2 month visit    History of Present Illness:    Tammy Small is a 65 y.o. female with past medical history of HFpEF, persistent pericardial effusion, persistent atrial fibrillation (prior ablation 5+ years ago with recurrence in 05/2021) HTN, HLD and Stage 3 CKD who presents to the office today for 19-month follow-up.  She was last examined by myself in 06/2021 following a recent hospitalization the month prior for an acute CHF exacerbation. Echocardiogram at that time and showed a preserved EF of 70 to 75% and a moderate pericardial effusion with no evidence of tamponade which was similar to prior imaging. Was diagnosed with recurrent atrial fibrillation that admission and started on Eliquis for anticoagulation. She reported still having dyspnea on exertion at the time of her office visit along with chronic lower extremity edema. Given her volume overload, Lasix was titrated from 40 mg daily to 60 mg daily with plans for a follow-up echocardiogram in 2 to 3 months for reassessment of her effusion. She was referred to Pulmonology given her severely elevated PASP by echocardiogram and nocturnal pauses as she would likely require a sleep study.  In talking with the patient today, she reports still having dyspnea on exertion which has been chronic for the past year and a half. Was previously recommended she be on supplemental oxygen as this helped with her symptoms but she is currently without insurance coverage and unable to afford this. Does report intermittent chest discomfort when she cannot catch her breath and this resolves upon taking deep breaths. Her pain is not necessarily worsened by positional changes, deep breathing or coughing. She denies any specific orthopnea or  PND. Still with lower extremity edema but weight has declined by 16 pounds since 06/2021.  Past Medical History:  Diagnosis Date   Essential hypertension    Hyperlipidemia    PAF (paroxysmal atrial fibrillation) (HCC)    Pericardial effusion    Type 2 diabetes mellitus (HCC)     Past Surgical History:  Procedure Laterality Date   CESAREAN SECTION      Current Medications: Outpatient Medications Prior to Visit  Medication Sig Dispense Refill   apixaban (ELIQUIS) 5 MG TABS tablet Take 1 tablet (5 mg total) by mouth 2 (two) times daily. 180 tablet 3   atorvastatin (LIPITOR) 20 MG tablet Take 1 tablet (20 mg total) by mouth daily. 90 tablet 0   Cholecalciferol (VITAMIN D3 PO) Take 1 tablet by mouth daily.     Cyanocobalamin (B-12 PO) Take 1 tablet by mouth daily.     Omega-3 Fatty Acids (OMEGA 3 PO) Take 1 capsule by mouth daily.     potassium chloride SA (KLOR-CON) 20 MEQ tablet Take 1 tablet (20 mEq total) by mouth daily. 90 tablet 0   furosemide (LASIX) 40 MG tablet Take 1.5 tablets (60 mg total) by mouth daily. 135 tablet 0   metoprolol succinate (TOPROL XL) 25 MG 24 hr tablet Take 1 tablet (25 mg total) by mouth daily. 90 tablet 0   No facility-administered medications prior to visit.     Allergies:   Patient has no known allergies.   Social History   Socioeconomic History   Marital status: Single    Spouse name: Not on  file   Number of children: Not on file   Years of education: Not on file   Highest education level: Not on file  Occupational History   Not on file  Tobacco Use   Smoking status: Former    Packs/day: 0.50    Years: 3.00    Pack years: 1.50    Types: Cigarettes    Quit date: 11/07/1972    Years since quitting: 48.8   Smokeless tobacco: Never  Vaping Use   Vaping Use: Never used  Substance and Sexual Activity   Alcohol use: Not Currently    Comment: last use 1974   Drug use: Not Currently    Types: Cocaine    Comment: last use 1974   Sexual  activity: Not on file  Other Topics Concern   Not on file  Social History Narrative   ** Merged History Encounter **       Social Determinants of Health   Financial Resource Strain: Not on file  Food Insecurity: Not on file  Transportation Needs: Not on file  Physical Activity: Not on file  Stress: Not on file  Social Connections: Not on file     Family History:  The patient's family history includes Diabetes in her father; Hypertension in her father.   Review of Systems:    Please see the history of present illness.     All other systems reviewed and are otherwise negative except as noted above.   Physical Exam:    VS:  BP 134/70   Pulse 87   Ht 5\' 4"  (1.626 m)   Wt 213 lb 8 oz (96.8 kg)   SpO2 94%   BMI 36.65 kg/m    General: Well developed, well nourished female who is tearful during this encounter.  Head: Normocephalic, atraumatic. Neck: No carotid bruits. JVD not elevated.  Lungs: Respirations regular and unlabored, without wheezes or rales.  Heart: Irregularly irregular. No S3 or S4.  No murmur, no rubs, or gallops appreciated. Abdomen: Appears non-distended. No obvious abdominal masses. Msk:  Strength and tone appear normal for age. No obvious joint deformities or effusions. Extremities: No clubbing or cyanosis. Chronic appearing pitting edema.  Distal pedal pulses are 2+ bilaterally. Neuro: Alert and oriented X 3. Moves all extremities spontaneously. No focal deficits noted. Psych:  Responds to questions appropriately with a normal affect. Skin: No rashes or lesions noted  Wt Readings from Last 3 Encounters:  08/27/21 213 lb 8 oz (96.8 kg)  08/25/21 219 lb 3.2 oz (99.4 kg)  06/25/21 229 lb 6.4 oz (104.1 kg)     Studies/Labs Reviewed:   EKG:  EKG is not ordered today.    Recent Labs: 05/21/2021: Magnesium 1.9 05/23/2021: Hemoglobin 13.2; Platelets 222 06/25/2021: ALT 37 08/25/2021: BNP 814.2; BUN 20; Creatinine, Ser 1.33; Potassium 3.6; Sodium 141    Lipid Panel    Component Value Date/Time   CHOL 99 06/25/2021 1220   TRIG 111 06/25/2021 1220   HDL 31 (L) 06/25/2021 1220   CHOLHDL 3.2 06/25/2021 1220   VLDL 22 06/25/2021 1220   LDLCALC 46 06/25/2021 1220    Additional studies/ records that were reviewed today include:   Echocardiogram: 05/21/2021 IMPRESSIONS     1. Left ventricular ejection fraction, by estimation, is 70 to 75%. The  left ventricle has hyperdynamic function. The left ventricle has no  regional wall motion abnormalities. There is moderate left ventricular  hypertrophy. Left ventricular diastolic  parameters are indeterminate.   2. Right ventricular  systolic function is low normal. The right  ventricular size is mildly enlarged. There is severely elevated pulmonary  artery systolic pressure.   3. Left atrial size was mild to moderately dilated.   4. Right atrial size was mildly dilated.   5. Moderate pericardial effusion. The pericardial effusion is  circumferential. There is no evidence of cardiac tamponade.   6. The mitral valve is normal in structure. Trivial mitral valve  regurgitation. No evidence of mitral stenosis.   7. The aortic valve is tricuspid. There is mild calcification of the  aortic valve. There is mild thickening of the aortic valve. Aortic valve  regurgitation is not visualized. No aortic stenosis is present.   8. The inferior vena cava is dilated in size with >50% respiratory  variability, suggesting right atrial pressure of 8 mmHg.   Assessment:    1. Diastolic dysfunction   2. Pericardial effusion   3. Persistent atrial fibrillation (HCC)   4. Essential hypertension   5. Stage 3a chronic kidney disease (HCC)   6. Positive ANA (antinuclear antibody)   7. Medication management      Plan:   In order of problems listed above:  1. HFpEF - She continues to have dyspnea on exertion which is likely multifactorial in the setting of her CHF, pericardial effusion, obesity,  possible OSA and intermittent hypoxia with supplemental oxygen previously recommended.  - She has lost almost 16 lbs since her last visit but continues to experience dyspnea on exertion. BNP was elevated to 814 when checked earlier this week. Will titrate Lasix from 60mg  daily to 40mg  BID. Repeat BNP and BMET in 2 weeks. We did discuss possibly switching to Torsemide but this is not covered by [facility]. Will plan to obtain a repeat limited echo as outlined above. If EF reduced or symptoms persist despite additional diuresis, would need to consider ischemic evaluation.   2. Pericardial Effusion - Her effusion was moderate by echo in 05/2020 and remained moderate by echo in 05/2021. Reviewed with Dr. 06/2020 and will plan for a repeat limited echo for reassessment of her EF and effusion. - She does report intermittent pain as outlined above but not necessarily pleuritic. Reports being on steroids in the past and this helped. Reviewed available medication options through [facility] and can try Colchicine 0.6mg  BID for 2 weeks. If no change in symptoms, would stop. If pain does improve, would recheck renal function and could treat for 3 months.   3. Persistent Atrial Fibrillation - She underwent prior ablation 5+ years ago with recurrence in 05/2021. Rates are well-controlled today. She is currently on Toprol-XL but this is not covered with her current plan. Will therefore switch back to Lopressor 25mg  BID.  - No reports of active bleeding. She remains on Eliquis 5mg  BID for anticoagulation which is the appropriate dose at this time based off her age, weight and kidney function.   4. HTN - Her BP is well-controlled at 134/70 during today's visit. Will adjust Toprol-XL to Lopressor as outlined above as this has been more affordable for her.   5. Stage 3 CKD - Baseline creatinine of 1.1 - 1.2, at 1.27 in 07/2021. Will recheck in 2 weeks.   6. Positive ANA - Recently checked by Pulmonology and  found to have a positive ANA. Says she was previously told in 06/2021 she had abnormal rheumatologic labs but never underwent further workup or formally diagnosed. Will refer to Rheumatology for further work-up and evaluation.  Medication Adjustments/Labs and Tests Ordered: Current medicines are reviewed at length with the patient today.  Concerns regarding medicines are outlined above.  Medication changes, Labs and Tests ordered today are listed in the Patient Instructions below. Patient Instructions  Medication Instructions:  Your physician has recommended you make the following change in your medication:  INCREASE Lasix to 40 mg tablets twice daily - take morning and afternoon START Colchicine 0.6 mg tablets twice daily for fourteen (14) days.    *If you need a refill on your cardiac medications before your next appointment, please call your pharmacy*   Lab Work: BMP/BNP If you have labs (blood work) drawn today and your tests are completely normal, you will receive your results only by: MyChart Message (if you have MyChart) OR A paper copy in the mail If you have any lab test that is abnormal or we need to change your treatment, we will call you to review the results.   Testing/Procedures: Your physician has requested that you have an echocardiogram. Echocardiography is a painless test that uses sound waves to create images of your heart. It provides your doctor with information about the size and shape of your heart and how well your heart's chambers and valves are working. This procedure takes approximately one hour. There are no restrictions for this procedure.    Follow-Up: At [facility], you and your health needs are our priority.  As part of our continuing mission to provide you with exceptional heart care, we have created designated Provider Care Teams.  These Care Teams include your primary Cardiologist (physician) and Advanced Practice Providers (APPs -  Physician  Assistants and Nurse Practitioners) who all work together to provide you with the care you need, when you need it.  We recommend signing up for the patient portal called "MyChart".  Sign up information is provided on this After Visit Summary.  MyChart is used to connect with patients for Virtual Visits (Telemedicine).  Patients are able to view lab/test results, encounter notes, upcoming appointments, etc.  Non-urgent messages can be sent to your provider as well.   To learn more about what you can do with MyChart, go to [email or web address].    Your next appointment:   3 month(s)  The format for your next appointment:   In Person  Provider:   Nona Dell, MD   Other Instructions You have been referred to Rheumatology. They will call you with your first appointment.     Signed, Ellsworth Lennox, PA-C  08/28/2021 4:00 PM    [facility] [address] [place], [place] [address] Phone: [phone] Fax: [phone]

## 2021-08-27 ENCOUNTER — Ambulatory Visit (INDEPENDENT_AMBULATORY_CARE_PROVIDER_SITE_OTHER): Payer: Self-pay | Admitting: Student

## 2021-08-27 ENCOUNTER — Encounter: Payer: Self-pay | Admitting: Student

## 2021-08-27 VITALS — BP 134/70 | HR 87 | Ht 64.0 in | Wt 213.5 lb

## 2021-08-27 DIAGNOSIS — R768 Other specified abnormal immunological findings in serum: Secondary | ICD-10-CM

## 2021-08-27 DIAGNOSIS — N1831 Chronic kidney disease, stage 3a: Secondary | ICD-10-CM

## 2021-08-27 DIAGNOSIS — I5189 Other ill-defined heart diseases: Secondary | ICD-10-CM

## 2021-08-27 DIAGNOSIS — Z79899 Other long term (current) drug therapy: Secondary | ICD-10-CM

## 2021-08-27 DIAGNOSIS — I3139 Other pericardial effusion (noninflammatory): Secondary | ICD-10-CM

## 2021-08-27 DIAGNOSIS — I4819 Other persistent atrial fibrillation: Secondary | ICD-10-CM

## 2021-08-27 DIAGNOSIS — I10 Essential (primary) hypertension: Secondary | ICD-10-CM

## 2021-08-27 LAB — BASIC METABOLIC PANEL
BUN/Creatinine Ratio: 15 (ref 12–28)
BUN: 20 mg/dL (ref 8–27)
CO2: 24 mmol/L (ref 20–29)
Calcium: 10 mg/dL (ref 8.7–10.3)
Chloride: 101 mmol/L (ref 96–106)
Creatinine, Ser: 1.33 mg/dL — ABNORMAL HIGH (ref 0.57–1.00)
Glucose: 95 mg/dL (ref 70–99)
Potassium: 3.6 mmol/L (ref 3.5–5.2)
Sodium: 141 mmol/L (ref 134–144)
eGFR: 45 mL/min/{1.73_m2} — ABNORMAL LOW (ref >59)

## 2021-08-27 LAB — CYCLIC CITRUL PEPTIDE ANTIBODY, IGG/IGA: Cyclic Citrullin Peptide Ab: 9 units (ref 0–19)

## 2021-08-27 MED ORDER — METOPROLOL TARTRATE 25 MG PO TABS: 25.0000 mg | ORAL_TABLET | Freq: Two times a day (BID) | ORAL | 3 refills | Status: DC

## 2021-08-27 MED ORDER — FUROSEMIDE 40 MG PO TABS: 40.0000 mg | ORAL_TABLET | Freq: Two times a day (BID) | ORAL | 3 refills | Status: DC

## 2021-08-27 MED ORDER — COLCHICINE 0.6 MG PO TABS: 0.6000 mg | ORAL_TABLET | Freq: Two times a day (BID) | ORAL | 0 refills | Status: DC

## 2021-08-27 MED ORDER — FUROSEMIDE 40 MG PO TABS: 60.0000 mg | ORAL_TABLET | Freq: Two times a day (BID) | ORAL | 3 refills | Status: DC

## 2021-08-27 NOTE — Patient Instructions (Addendum)
Medication Instructions:  Your physician has recommended you make the following change in your medication:  INCREASE Lasix to 40 mg tablets twice daily - take morning and afternoon START Colchicine 0.6 mg tablets twice daily for fourteen (14) days.    *If you need a refill on your cardiac medications before your next appointment, please call your pharmacy*   Lab Work: BMP/BNP If you have labs (blood work) drawn today and your tests are completely normal, you will receive your results only by: MyChart Message (if you have MyChart) OR A paper copy in the mail If you have any lab test that is abnormal or we need to change your treatment, we will call you to review the results.   Testing/Procedures: Your physician has requested that you have an echocardiogram. Echocardiography is a painless test that uses sound waves to create images of your heart. It provides your doctor with information about the size and shape of your heart and how well your heart's chambers and valves are working. This procedure takes approximately one hour. There are no restrictions for this procedure.    Follow-Up: At [facility], you and your health needs are our priority.  As part of our continuing mission to provide you with exceptional heart care, we have created designated Provider Care Teams.  These Care Teams include your primary Cardiologist (physician) and Advanced Practice Providers (APPs -  Physician Assistants and Nurse Practitioners) who all work together to provide you with the care you need, when you need it.  We recommend signing up for the patient portal called "MyChart".  Sign up information is provided on this After Visit Summary.  MyChart is used to connect with patients for Virtual Visits (Telemedicine).  Patients are able to view lab/test results, encounter notes, upcoming appointments, etc.  Non-urgent messages can be sent to your provider as well.   To learn more about what you can do with  MyChart, go to [email or web address].    Your next appointment:   3 month(s)  The format for your next appointment:   In Person  Provider:   Nona Dell, MD   Other Instructions You have been referred to Rheumatology. They will call you with your first appointment.

## 2021-08-28 ENCOUNTER — Encounter: Payer: Self-pay | Admitting: Student

## 2021-09-03 ENCOUNTER — Telehealth: Payer: Self-pay | Admitting: Student

## 2021-09-03 MED ORDER — COLCHICINE 0.6 MG PO TABS: 0.6000 mg | ORAL_TABLET | Freq: Two times a day (BID) | ORAL | 0 refills | Status: DC

## 2021-09-03 NOTE — Telephone Encounter (Signed)
Pt c/o medication issue:  1. Name of Medication: colchicine 0.6 MG tablet  2. How are you currently taking this medication (dosage and times per day)? Has not started yet  3. Are you having a reaction (difficulty breathing--STAT)? no  4. What is your medication issue? Patient states the medication was not received by her pharmacy. She states her metoprolol and furosemide were received, but not the colchicine.

## 2021-09-03 NOTE — Telephone Encounter (Signed)
I spoke with Judeth Cornfield at [facility] and they did in fact receive prescription but since patient is not a [facility] resident they cannot fill for her. Judeth Cornfield states patient was told this information already.They recommend we take to company [employer] -which makes colchicine to ask for patient assistance.      I spoke with Ms.Guandique and she reports she feels the same as she did a week ago as far as still having chest discomfort.      I will FYI B.Iran Ouch, PA-C

## 2021-09-03 NOTE — Telephone Encounter (Signed)
Patient requests colchicine be e-scribed to [facility]

## 2021-09-03 NOTE — Telephone Encounter (Signed)
   She told us she was using [facility] at the time of her visit and Colchicine was listed as an option. Is there a different pharmacy she has used? 30 tablets of generic Colchicine is $25 at [facility] per [facility] so this might be an option depending on financial coverage. Could send in a week's worth to see if this helps if that would be more affordable.   Signed, Ellsworth Lennox, PA-C 09/03/2021, 3:06 PM Pager: [phone]

## 2021-09-15 ENCOUNTER — Other Ambulatory Visit: Payer: Self-pay | Admitting: Physician Assistant

## 2021-09-15 DIAGNOSIS — I509 Heart failure, unspecified: Secondary | ICD-10-CM

## 2021-09-15 DIAGNOSIS — N189 Chronic kidney disease, unspecified: Secondary | ICD-10-CM

## 2021-09-15 DIAGNOSIS — I10 Essential (primary) hypertension: Secondary | ICD-10-CM

## 2021-09-15 DIAGNOSIS — E785 Hyperlipidemia, unspecified: Secondary | ICD-10-CM

## 2021-09-20 ENCOUNTER — Other Ambulatory Visit (HOSPITAL_COMMUNITY)
Admission: RE | Admit: 2021-09-20 | Discharge: 2021-09-20 | Disposition: A | Discharge status: Home / Self Care | Payer: Self-pay | Source: Ambulatory Visit | Attending: Student | Admitting: Student

## 2021-09-20 ENCOUNTER — Telehealth: Payer: Self-pay

## 2021-09-20 DIAGNOSIS — I4819 Other persistent atrial fibrillation: Secondary | ICD-10-CM | POA: Insufficient documentation

## 2021-09-20 DIAGNOSIS — Z79899 Other long term (current) drug therapy: Secondary | ICD-10-CM | POA: Insufficient documentation

## 2021-09-20 LAB — BASIC METABOLIC PANEL
Anion gap: 10 (ref 5–15)
BUN: 12 mg/dL (ref 8–23)
CO2: 26 mmol/L (ref 22–32)
Calcium: 9.5 mg/dL (ref 8.9–10.3)
Chloride: 104 mmol/L (ref 98–111)
Creatinine, Ser: 1.24 mg/dL — ABNORMAL HIGH (ref 0.44–1.00)
GFR, Estimated: 49 mL/min — ABNORMAL LOW (ref >60)
Glucose, Bld: 148 mg/dL — ABNORMAL HIGH (ref 70–99)
Potassium: 3.4 mmol/L — ABNORMAL LOW (ref 3.5–5.1)
Sodium: 140 mmol/L (ref 135–145)

## 2021-09-20 LAB — BRAIN NATRIURETIC PEPTIDE: B Natriuretic Peptide: 1049 pg/mL — ABNORMAL HIGH (ref 0.0–100.0)

## 2021-09-20 MED ORDER — POTASSIUM CHLORIDE CRYS ER 20 MEQ PO TBCR: 40.0000 meq | EXTENDED_RELEASE_TABLET | Freq: Every day | ORAL | 3 refills | Status: DC

## 2021-09-20 NOTE — Telephone Encounter (Signed)
Patient notified of lab results. Pt stated that her symptoms have improved and she is down to 206 lb. Pt verbalized understanding of increasing K-Dur to 40 mEq daily. Pt had no questions or concerns at this time.

## 2021-09-20 NOTE — Telephone Encounter (Signed)
-----   Message from Ellsworth Lennox, [place] sent at 09/20/2021 10:15 AM EST ----- Please let the patient know her fluid level remains elevated. Kidney function is stable but K+ is low at 3.4. Would recommend she increase K-dur to 40 mEq daily. Have her symptoms improved since titrating Lasix to 40mg  BID? Has she been able to follow her weight?

## 2021-09-21 ENCOUNTER — Other Ambulatory Visit: Payer: Self-pay | Admitting: Physician Assistant

## 2021-09-21 ENCOUNTER — Telehealth: Payer: Self-pay | Admitting: Student

## 2021-09-21 ENCOUNTER — Telehealth: Payer: Self-pay

## 2021-09-21 NOTE — Telephone Encounter (Signed)
I did not need this encounter. °

## 2021-09-21 NOTE — Telephone Encounter (Signed)
Tammy Small called to say her SOB was hardly a bother since increasing her lasix to 40 mg twice a day. She will continue to follow weights and let us know if her weight escalates .

## 2021-09-28 ENCOUNTER — Other Ambulatory Visit (HOSPITAL_COMMUNITY)
Admission: RE | Admit: 2021-09-28 | Discharge: 2021-09-28 | Disposition: A | Discharge status: Home / Self Care | Payer: Self-pay | Source: Ambulatory Visit | Attending: Physician Assistant | Admitting: Physician Assistant

## 2021-09-28 DIAGNOSIS — I509 Heart failure, unspecified: Secondary | ICD-10-CM | POA: Insufficient documentation

## 2021-09-28 DIAGNOSIS — E785 Hyperlipidemia, unspecified: Secondary | ICD-10-CM | POA: Insufficient documentation

## 2021-09-28 DIAGNOSIS — N189 Chronic kidney disease, unspecified: Secondary | ICD-10-CM | POA: Insufficient documentation

## 2021-09-28 DIAGNOSIS — I10 Essential (primary) hypertension: Secondary | ICD-10-CM | POA: Insufficient documentation

## 2021-09-28 LAB — LIPID PANEL
Cholesterol: 92 mg/dL (ref 0–200)
HDL: 32 mg/dL — ABNORMAL LOW (ref >40)
LDL Cholesterol: 44 mg/dL (ref 0–99)
Total CHOL/HDL Ratio: 2.9 RATIO
Triglycerides: 82 mg/dL (ref <150)
VLDL: 16 mg/dL (ref 0–40)

## 2021-09-28 LAB — COMPREHENSIVE METABOLIC PANEL
ALT: 15 U/L (ref 0–44)
AST: 19 U/L (ref 15–41)
Albumin: 3.9 g/dL (ref 3.5–5.0)
Alkaline Phosphatase: 86 U/L (ref 38–126)
Anion gap: 11 (ref 5–15)
BUN: 16 mg/dL (ref 8–23)
CO2: 26 mmol/L (ref 22–32)
Calcium: 9.4 mg/dL (ref 8.9–10.3)
Chloride: 102 mmol/L (ref 98–111)
Creatinine, Ser: 1.14 mg/dL — ABNORMAL HIGH (ref 0.44–1.00)
GFR, Estimated: 54 mL/min — ABNORMAL LOW (ref >60)
Glucose, Bld: 138 mg/dL — ABNORMAL HIGH (ref 70–99)
Potassium: 3.6 mmol/L (ref 3.5–5.1)
Sodium: 139 mmol/L (ref 135–145)
Total Bilirubin: 2.9 mg/dL — ABNORMAL HIGH (ref 0.3–1.2)
Total Protein: 6.5 g/dL (ref 6.5–8.1)

## 2021-10-04 ENCOUNTER — Ambulatory Visit: Payer: Medicaid Other | Admitting: Physician Assistant

## 2021-10-04 ENCOUNTER — Telehealth: Payer: Self-pay | Admitting: Physician Assistant

## 2021-10-04 NOTE — Telephone Encounter (Signed)
Tammy Small called on Sunday night to cancel her appointment with Tammy Hawking PA-C and stated she had a bad cold. She stated she had shortness of breath and wanted Tammy Small to call her in oxygen. After play voicemail for Tammy Small called Tammy Small and advised her to go to ER for evaluation. I spoke with Tammy Small and told her to go to ER as soon as possible. She stated she would go.

## 2021-10-06 NOTE — Progress Notes (Signed)
Office Visit Note  Patient: Tammy Small             Date of Birth: 1956/06/16           MRN: [id number]             PCP: Soyla Dryer, PA-C Referring: Erma Heritage, PA* Visit Date: 10/07/2021 Occupation: Retired Data processing manager work  Subjective:  New Patient (Initial Visit) (SOB)   History of Present Illness: Tammy Small is a 65 y.o. female here for evaluation with positive ANA in the setting of chronic moderate pericardial effusion with HFpEF, hypoxia, and pulmonary hypertension issues. She moved from [place] to [place] last year. She has been found to have moderate pericardial effusion without tamponade physiology on studies since at least 05/2020. She denies any chest pain symptoms with position or inspiration. She infrequently has coughs. She does experience some chest pain when exerting herself causes dyspnea to develop. Cardiac workup including TTE and carotid US and labs showing consistent elevated BNP. She was admitted to [facility] in 05/2021 due to apparent decompensation of heart failure. She has also been evaluated for pulmonary disease due to increased pulmonary arterial systolic pressure with findings of ambulatory oxygen desaturation and also scheduled for sleep study with high pretest suspicion for significant OSA. Otherwise chest imaging negative for evidence of ILD and negative VQ scan. She remains on long derm DOAC anticoagulation for Afib, with a previous ablation for this but had return of paroxysmal afib. She was started on colchicine for treatment of pericardial effusion and took this medicine for about 2 weeks so far not noticing any major change in symptoms nor any side effects. Lab testing was positive for ANA, with a history of apparent abnormal serology in previous [place] physician offices but no prior rheumatologic disease diagnosis. Review of systems and exam in cardiology clinic regarding this was benign. Currently she reports some swelling primarily  limited to lower extremities also chronic swelling around eyes she reports after falling onto her face years ago with residual hyperpigmented changes. She denies any oral ulcers, lymphadenopathy, raynaud's symptoms, or history of blood clots.   Labs reviewed 08/2021 ANA pos CCP 9 ESR 2 BMP eGFR 45 BNP 814.2  Activities of Daily Living:  Patient reports morning stiffness for 0  none .   Patient Denies nocturnal pain.  Difficulty dressing/grooming: Denies Difficulty climbing stairs: Reports Difficulty getting out of chair: Denies Difficulty using hands for taps, buttons, cutlery, and/or writing: Denies  Review of Systems  Constitutional:  Positive for fatigue.  HENT:  Negative for mouth dryness.   Eyes:  Negative for dryness.  Respiratory:  Positive for shortness of breath.   Cardiovascular:  Positive for swelling in legs/feet.  Gastrointestinal:  Positive for constipation and diarrhea.  Endocrine: Positive for heat intolerance.  Genitourinary:  Negative for difficulty urinating.  Musculoskeletal:  Positive for joint swelling.  Skin:  Negative for rash.  Allergic/Immunologic: Negative for susceptible to infections.  Neurological:  Negative for numbness.  Hematological:  Negative for bruising/bleeding tendency.  Psychiatric/Behavioral:  Negative for sleep disturbance.    PMFS History:  Patient Active Problem List   Diagnosis Date Noted   Positive ANA (antinuclear antibody) 10/07/2021   Chronic respiratory failure with hypoxia ([place]) 08/25/2021   Pericardial effusion 08/25/2021   Acute exacerbation of CHF (congestive heart failure) ([place]) 05/22/2021   Dyspnea and respiratory abnormalities 05/21/2021   Paroxysmal A-fib ([place]) 05/21/2021   HTN (hypertension) 05/21/2021   Obesity (BMI 30-39.9) 05/21/2021  Past Medical History:  Diagnosis Date   Essential hypertension    Hyperlipidemia    PAF (paroxysmal atrial fibrillation) (HCC)    Pericardial effusion    Type 2 diabetes  mellitus ([place])     Family History  Problem Relation Age of Onset   Hypertension Father    Diabetes Father    Asthma Brother    Bronchitis Brother    Past Surgical History:  Procedure Laterality Date   CESAREAN SECTION     Social History   Social History Narrative   ** Merged History Encounter **       Immunization History  Administered Date(s) Administered   Engineer, 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  Eyes:     Conjunctiva/sclera: Conjunctivae normal.     Comments: Infraorbital hyperpigmentation and probable chronic swelling  Cardiovascular:     Rate and Rhythm: Normal rate. Rhythm irregular.  Pulmonary:     Breath sounds: Normal breath sounds.  Skin:    General: Skin is warm and dry.     Findings: No rash.     Comments: Dry skin on distal legs, no appreciable pitting  Neurological:     Mental Status: She is alert.     Deep Tendon Reflexes: Reflexes normal.  Psychiatric:     Comments: Tearful during encounter     Musculoskeletal Exam:  Neck full ROM no tenderness Shoulders full ROM no tenderness or swelling Elbows full ROM no tenderness or swelling Wrists full ROM no tenderness or swelling Fingers full ROM no tenderness or swelling Knees full ROM no tenderness or swelling Ankles full ROM no tenderness or swelling    Investigation: No additional findings.  Imaging: No results found.  Recent Labs: Lab Results  Component Value Date   WBC 7.5 05/23/2021   HGB 13.2 05/23/2021   PLT 222 05/23/2021   NA 139 09/28/2021   K 3.6 09/28/2021   CL 102 09/28/2021   CO2 26 09/28/2021   GLUCOSE 138 (H) 09/28/2021   BUN 16 09/28/2021    CREATININE 1.14 (H) 09/28/2021   BILITOT 2.9 (H) 09/28/2021   ALKPHOS 86 09/28/2021   AST 19 09/28/2021   ALT 15 09/28/2021   PROT 6.5 09/28/2021   ALBUMIN 3.9 09/28/2021   CALCIUM 9.4 09/28/2021   GFRAA 49 (L) 07/28/2020    Speciality Comments: No specialty comments available.  Procedures:  No procedures performed Allergies: Patient has no known allergies.   Assessment / Plan:     Visit Diagnoses: Positive ANA (antinuclear antibody) - Plan: RNP Antibody, Anti-Smith antibody, Sjogrens syndrome-A extractable nuclear antibody, Anti-DNA antibody, double-stranded, C3 and C4, Rheumatoid factor  Positive ANA no clear extrathoracic signs or clinical criteria on exam today. No adenopathy or lung changes suggestive for CTD-ILD or sarcoidosis changes. Will check specific antibody markers also complement and rheumatoid factor today for evaluation.  Pericardial effusion  She has upcoming echocardiogram scheduled next week. Effusion was present 05/2020 significantly prior to current symptom complaints. So far she has not noticed a clinical change with oral colchicine treatment.   Chronic respiratory failure with hypoxia (HCC)  Ongoing management with  cardiology and pulmonary so far symptoms remain significant. Not yet on oxygen or had sleep study although expected abnormal.  Orders: Orders Placed This Encounter  Procedures   RNP Antibody   Anti-Smith antibody   Sjogrens syndrome-A extractable nuclear antibody   Anti-DNA antibody, double-stranded   C3 and C4   Rheumatoid factor    No orders of the defined types were placed in this encounter.    Follow-Up Instructions: No follow-ups on file.   Collier Salina, MD  Note - This record has been created using [employer].  Chart creation errors have been sought, but may not always  have been located. Such creation errors do not reflect on  the standard of medical care.

## 2021-10-07 ENCOUNTER — Encounter: Payer: Self-pay | Admitting: Internal Medicine

## 2021-10-07 ENCOUNTER — Ambulatory Visit (INDEPENDENT_AMBULATORY_CARE_PROVIDER_SITE_OTHER): Payer: Self-pay | Admitting: Internal Medicine

## 2021-10-07 ENCOUNTER — Other Ambulatory Visit (HOSPITAL_COMMUNITY)
Admission: RE | Admit: 2021-10-07 | Discharge: 2021-10-07 | Disposition: A | Discharge status: Home / Self Care | Payer: Self-pay | Source: Ambulatory Visit | Attending: Internal Medicine | Admitting: Internal Medicine

## 2021-10-07 ENCOUNTER — Other Ambulatory Visit: Payer: Self-pay

## 2021-10-07 VITALS — BP 144/85 | HR 93 | Resp 22 | Ht 62.0 in | Wt 212.0 lb

## 2021-10-07 DIAGNOSIS — J9611 Chronic respiratory failure with hypoxia: Secondary | ICD-10-CM

## 2021-10-07 DIAGNOSIS — R768 Other specified abnormal immunological findings in serum: Secondary | ICD-10-CM

## 2021-10-07 DIAGNOSIS — I3139 Other pericardial effusion (noninflammatory): Secondary | ICD-10-CM

## 2021-10-08 LAB — SJOGRENS SYNDROME-A EXTRACTABLE NUCLEAR ANTIBODY: SSA (Ro) (ENA) Antibody, IgG: <0.2 AI (ref 0.0–0.9)

## 2021-10-08 LAB — ANTI-DNA ANTIBODY, DOUBLE-STRANDED: ds DNA Ab: <1 IU/mL (ref 0–9)

## 2021-10-08 LAB — RHEUMATOID FACTOR: Rheumatoid fact SerPl-aCnc: <10 IU/mL (ref <14.0)

## 2021-10-08 LAB — C3 COMPLEMENT: C3 Complement: 117 mg/dL (ref 82–167)

## 2021-10-08 LAB — ANTI-SMITH ANTIBODY: ENA SM Ab Ser-aCnc: <0.2 AI (ref 0.0–0.9)

## 2021-10-08 LAB — C4 COMPLEMENT: Complement C4, Body Fluid: 22 mg/dL (ref 12–38)

## 2021-10-11 ENCOUNTER — Ambulatory Visit (HOSPITAL_COMMUNITY)
Admission: RE | Admit: 2021-10-11 | Discharge: 2021-10-11 | Disposition: A | Discharge status: Home / Self Care | Payer: Self-pay | Source: Ambulatory Visit | Attending: Student | Admitting: Student

## 2021-10-11 ENCOUNTER — Other Ambulatory Visit: Payer: Self-pay

## 2021-10-11 DIAGNOSIS — I3139 Other pericardial effusion (noninflammatory): Secondary | ICD-10-CM | POA: Insufficient documentation

## 2021-10-11 LAB — ECHOCARDIOGRAM LIMITED
Area-P 1/2: 4.39 cm2
S' Lateral: 1.8 cm

## 2021-10-11 NOTE — Progress Notes (Signed)
*  PRELIMINARY RESULTS* Echocardiogram 2D Echocardiogram has been performed.  Stacey Drain 10/11/2021, 9:34 AM

## 2021-10-13 LAB — MISC LABCORP TEST (SEND OUT): Labcorp test code: 520034

## 2021-10-15 ENCOUNTER — Telehealth: Payer: Self-pay

## 2021-10-15 DIAGNOSIS — I3139 Other pericardial effusion (noninflammatory): Secondary | ICD-10-CM

## 2021-10-15 NOTE — Telephone Encounter (Signed)
I spoke with patient.She agrees to see CT surgeon. Referral placed to [facility]

## 2021-10-15 NOTE — Telephone Encounter (Signed)
-----   Message from Ellsworth Lennox, [place] sent at 10/12/2021  4:19 PM EST ----- I tried calling the patient to review her echocardiogram results but no answer.   Please let the patient know her echocardiogram shows the pumping function of her heart remains normal but the fluid collection around her heart (pericardial effusion) has increased in size as compared to her prior study  I did review this with Dr. Diona Browner and given that her effusion is located on the posterior side of her heart, he did recommend that she see CT surgery for consideration of a pericardial window to help with draining this. If the patient is in agreement, would enter referral to CT Surgery for further discussion of this.

## 2021-10-18 NOTE — Progress Notes (Signed)
I spoke with Tammy Small all of her labs checked in clinic were negative or within normal range.  I did not see any specific clinical signs of systemic inflammation.  Based on this I do not think her positive ANA represents active underlying systemic autoimmune disease causing her pericardial effusion.  No scheduled follow-up or new treatments recommended at this time. She has ongoing follow up with cardiology and possibly CT surgery.

## 2021-10-19 ENCOUNTER — Telehealth: Payer: Self-pay | Admitting: Internal Medicine

## 2021-10-19 ENCOUNTER — Telehealth: Payer: Self-pay

## 2021-10-19 DIAGNOSIS — I3139 Other pericardial effusion (noninflammatory): Secondary | ICD-10-CM

## 2021-10-19 NOTE — Telephone Encounter (Signed)
Checking percert on the following patient for testing scheduled at [facility].     CT CHEST WI/O CONTRAST  11/02/2021

## 2021-10-19 NOTE — Telephone Encounter (Signed)
Order entered for non-contrast chest ct  CT scheduled for 11/02/21, arrive 2:45 pm to main desk registration at [facility]   Patient notified

## 2021-10-19 NOTE — Telephone Encounter (Signed)
-----   Message from Jonelle Sidle, MD sent at 10/19/2021  9:50 AM EST ----- See follow-up message.  [facility] requests noncontrast chest CT for evaluation of pericardial effusion.  Can you please get this ordered before their consultation? ----- Message ----- From: Roe Coombs Sent: 10/19/2021   9:29 AM EST To: Jonelle Sidle, MD  Yes. Thanks  CT Chest w/o contrast ----- Message ----- From: Jonelle Sidle, MD Sent: 10/19/2021   9:18 AM EST To: Ludger Nutting, RN  Do you mean a noncontrasted chest CT?  Please let us know exactly what you want and we will get it scheduled. ----- Message ----- From: Roe Coombs Sent: 10/19/2021   9:04 AM EST To: Jonelle Sidle, MD, Nori Riis, RN  Patient will need a CT Chest set up and scheduled and our office can then set her up for appointment to see our surgeon.  Thanks  [place] [facility] [phone]

## 2021-10-20 ENCOUNTER — Encounter: Payer: Self-pay | Admitting: Physician Assistant

## 2021-10-20 ENCOUNTER — Ambulatory Visit: Payer: Medicaid Other | Admitting: Physician Assistant

## 2021-10-20 ENCOUNTER — Other Ambulatory Visit: Payer: Self-pay

## 2021-10-20 VITALS — BP 135/101 | HR 87 | Temp 97.1°F | Wt 201.0 lb

## 2021-10-20 DIAGNOSIS — R7303 Prediabetes: Secondary | ICD-10-CM

## 2021-10-20 DIAGNOSIS — N189 Chronic kidney disease, unspecified: Secondary | ICD-10-CM

## 2021-10-20 DIAGNOSIS — I10 Essential (primary) hypertension: Secondary | ICD-10-CM

## 2021-10-20 DIAGNOSIS — E785 Hyperlipidemia, unspecified: Secondary | ICD-10-CM

## 2021-10-20 DIAGNOSIS — I4891 Unspecified atrial fibrillation: Secondary | ICD-10-CM

## 2021-10-20 DIAGNOSIS — I509 Heart failure, unspecified: Secondary | ICD-10-CM

## 2021-10-20 DIAGNOSIS — Z7901 Long term (current) use of anticoagulants: Secondary | ICD-10-CM

## 2021-10-20 DIAGNOSIS — I3139 Other pericardial effusion (noninflammatory): Secondary | ICD-10-CM

## 2021-10-20 NOTE — Progress Notes (Signed)
BP (!) 135/101    Pulse 87    Temp (!) 97.1 F (36.2 C)    Wt 201 lb (91.2 kg)    SpO2 96%    BMI 36.76 kg/m    Subjective:    Patient ID: Tammy Small, female    DOB: [birth year], 65 y.o.   MRN: [id number]  HPI: Tammy Small is a 65 y.o. female presenting on 10/20/2021 for Hyperlipidemia   HPI    Chief Complaint  Patient presents with   Hyperlipidemia      Pt is a pleasant 65yoF who was unable to come in for her dyslipidemia appointment in November because she was sick.    Pt has HFpEF, persistent pericardial effusion, AF s/p ablation with recurrence, HTN, dyslipidemia and CKD.    She is having some Problems with sob related to her cardiac issues.  She Has appointment for CT and then to see CT surgeon later this month.  She is having persistent pericardial effusion of unknown etiology.  She was seen by rheumatologist who felt the effusion was not related to rheumatologic condition  Her medicaid application is getting re-done Her last covid shot was in january   Relevant past medical, surgical, family and social history reviewed and updated as indicated. Interim medical history since our last visit reviewed. Allergies and medications reviewed and updated.   Current Outpatient Medications:    apixaban (ELIQUIS) 5 MG TABS tablet, Take 1 tablet (5 mg total) by mouth 2 (two) times daily., Disp: 180 tablet, Rfl: 3   atorvastatin (LIPITOR) 20 MG tablet, TAKE 1 Tablet BY MOUTH ONCE EVERY DAY, Disp: 90 tablet, Rfl: 0   furosemide (LASIX) 40 MG tablet, Take 1 tablet (40 mg total) by mouth 2 (two) times daily., Disp: 270 tablet, Rfl: 3   metoprolol tartrate (LOPRESSOR) 25 MG tablet, Take 1 tablet (25 mg total) by mouth 2 (two) times daily., Disp: 180 tablet, Rfl: 3   potassium chloride SA (KLOR-CON) 20 MEQ tablet, Take 2 tablets (40 mEq total) by mouth daily., Disp: 180 tablet, Rfl: 3   Cholecalciferol (VITAMIN D3 PO), Take 1 tablet by mouth daily. (Patient not taking: Reported on  10/20/2021), Disp: , Rfl:    colchicine 0.6 MG tablet, Take 1 tablet (0.6 mg total) by mouth 2 (two) times daily. (Patient not taking: Reported on 10/07/2021), Disp: 28 tablet, Rfl: 0   Cyanocobalamin (B-12 PO), Take 1 tablet by mouth daily. (Patient not taking: Reported on 10/20/2021), Disp: , Rfl:    Omega-3 Fatty Acids (OMEGA 3 PO), Take 1 capsule by mouth daily. (Patient not taking: Reported on 10/20/2021), Disp: , Rfl:     Review of Systems  Per HPI unless specifically indicated above     Objective:    BP (!) 135/101    Pulse 87    Temp (!) 97.1 F (36.2 C)    Wt 201 lb (91.2 kg)    SpO2 96%    BMI 36.76 kg/m   Wt Readings from Last 3 Encounters:  10/20/21 201 lb (91.2 kg)  10/07/21 212 lb (96.2 kg)  08/27/21 213 lb 8 oz (96.8 kg)    Physical Exam Vitals reviewed.  Constitutional:      General: She is not in acute distress.    Appearance: She is well-developed. She is not toxic-appearing.  HENT:     Head: Normocephalic and atraumatic.  Cardiovascular:     Rate and Rhythm: Normal rate. Rhythm irregular.  Pulmonary:     Effort: Pulmonary  effort is normal.     Breath sounds: Normal breath sounds.     Comments: RR 20 Abdominal:     General: Bowel sounds are normal.     Palpations: Abdomen is soft. There is no mass.     Tenderness: There is no abdominal tenderness.  Musculoskeletal:     Cervical back: Neck supple.     Right lower leg: Edema present.     Left lower leg: Edema present.  Lymphadenopathy:     Cervical: No cervical adenopathy.  Skin:    General: Skin is warm and dry.  Neurological:     Mental Status: She is alert and oriented to person, place, and time.  Psychiatric:        Attention and Perception: Attention normal.        Mood and Affect: Affect is tearful.        Speech: Speech normal.        Behavior: Behavior normal. Behavior is cooperative.          Assessment & Plan:    Encounter Diagnoses  Name Primary?   Hyperlipidemia, unspecified  hyperlipidemia type Yes   Chronic heart failure, unspecified heart failure type (HCC)    Chronic kidney disease, unspecified CKD stage    HTN (hypertension), malignant    Pericardial effusion    Prediabetes    Atrial fibrillation, unspecified type (HCC)    Anticoagulated      -reviewed lipids labs done end of November with pt -Recommended pt get covid booster -She already got a flu shot this season -pt to continue with cardiology per their recommendations -HCM up to date -offered counseling with [facility] and pt declined -pt to follow up here end of February.  She is to contact office sooner prn

## 2021-11-02 ENCOUNTER — Ambulatory Visit (HOSPITAL_COMMUNITY)
Admission: RE | Admit: 2021-11-02 | Discharge: 2021-11-02 | Disposition: A | Discharge status: Home / Self Care | Payer: Self-pay | Source: Ambulatory Visit | Attending: Cardiology | Admitting: Cardiology

## 2021-11-02 ENCOUNTER — Other Ambulatory Visit: Payer: Self-pay

## 2021-11-02 DIAGNOSIS — I3139 Other pericardial effusion (noninflammatory): Secondary | ICD-10-CM

## 2021-11-05 ENCOUNTER — Encounter: Payer: Self-pay | Admitting: *Deleted

## 2021-11-05 ENCOUNTER — Other Ambulatory Visit: Payer: Self-pay | Admitting: *Deleted

## 2021-11-05 ENCOUNTER — Other Ambulatory Visit: Payer: Self-pay

## 2021-11-05 ENCOUNTER — Institutional Professional Consult (permissible substitution) (INDEPENDENT_AMBULATORY_CARE_PROVIDER_SITE_OTHER): Payer: Self-pay | Admitting: Thoracic Surgery (Cardiothoracic Vascular Surgery)

## 2021-11-05 VITALS — BP 145/88 | HR 80 | Resp 20 | Ht 62.0 in | Wt 208.0 lb

## 2021-11-05 DIAGNOSIS — I3139 Other pericardial effusion (noninflammatory): Secondary | ICD-10-CM

## 2021-11-05 NOTE — Progress Notes (Signed)
° °   °[address] °      [place] [address] °            [phone]       ° °Tammy Small °[facility] Medical Record #[id number] °Date of Birth: 11/27/1955 ° °Referring: McDowell, Samuel G, MD °Primary Care: McElroy, Shannon, PA-C °Primary Cardiologist:Samuel McDowell, MD ° °Chief Complaint:    °Chief Complaint  °Patient presents with  ° Pericardial Effusion  °  Surgical consult, Chest CT 11/02/21, ECHO 10/11/21  ° ° °History of Present Illness:     °65-year-old female presents for 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  She states that she has been short of breath since July of this year.  She underwent cross-sectional imaging as well as an echocardiogram which showed a posterior pericardial effusion.  She has been evaluated by rheumatology and there is concern that this may be reactive given her positive ANA.  She has been on colchicine as well as Lasix without much improvement in the effusion or symptoms. ° °She does have a history of atrial fibrillation and is on Eliquis. ° ° ° °Past Medical History:  °Diagnosis Date  ° Essential hypertension   ° Hyperlipidemia   ° PAF (paroxysmal atrial fibrillation) (HCC)   ° Pericardial effusion   ° Type 2 diabetes mellitus (HCC)   ° ° °Past Surgical History:  °Procedure Laterality Date  ° CESAREAN SECTION    ° ° °Social History: °Support: Lives alone but her parents are her main support system. ° °Social History  ° °Tobacco Use  °Smoking Status Former  ° Packs/day: 0.50  ° Years: 3.00  ° Pack years: 1.50  ° Types: Cigarettes  ° Quit date: 11/07/1972  ° Years since quitting: 49.0  °Smokeless Tobacco Never  °  °Social History  ° °Substance and Sexual Activity  °Alcohol Use Not Currently  ° Comment: last use 1974  ° ° ° °No Known Allergies ° ° °Current Outpatient Medications  °Medication Sig Dispense Refill  ° apixaban (ELIQUIS) 5 MG TABS tablet Take 1 tablet (5 mg total) by mouth 2 (two) times daily. 180 tablet 3  ° atorvastatin (LIPITOR) 20  MG tablet TAKE 1 Tablet BY MOUTH ONCE EVERY DAY 90 tablet 0  ° Cholecalciferol (VITAMIN D3 PO) Take 1 tablet by mouth daily.    ° Cyanocobalamin (B-12 PO) Take 1 tablet by mouth daily.    ° furosemide (LASIX) 40 MG tablet Take 1 tablet (40 mg total) by mouth 2 (two) times daily. 270 tablet 3  ° metoprolol tartrate (LOPRESSOR) 25 MG tablet Take 1 tablet (25 mg total) by mouth 2 (two) times daily. 180 tablet 3  ° Omega-3 Fatty Acids (OMEGA 3 PO) Take 1 capsule by mouth daily.    ° potassium chloride SA (KLOR-CON) 20 MEQ tablet Take 2 tablets (40 mEq total) by mouth daily. 180 tablet 3  ° colchicine 0.6 MG tablet Take 1 tablet (0.6 mg total) by mouth 2 (two) times daily. (Patient not taking: Reported on 11/05/2021) 28 tablet 0  ° °No current facility-administered medications for this visit.  ° ° °(Not in a hospital admission) ° ° °Family History  °Problem Relation Age of Onset  ° Hypertension Father   ° Diabetes Father   ° Asthma Brother   ° Bronchitis Brother   ° ° ° °Review of Systems:  ° °Review of Systems  °Constitutional:  Positive for malaise/fatigue.  °Respiratory:  Positive for shortness of breath.   °Cardiovascular:    Positive for leg swelling. Negative for chest pain.  Musculoskeletal:  Positive for joint pain and myalgias.     Physical Exam: BP (!) 145/88    Pulse 80    Resp 20    Ht 5\' 2"  (1.575 m)    Wt 208 lb (94.3 kg)    SpO2 93% Comment: RA   BMI 38.04 kg/m  Physical Exam Constitutional:      General: She is not in acute distress.    Appearance: She is obese. She is not ill-appearing or toxic-appearing.  HENT:     Head: Normocephalic and atraumatic.  Eyes:     Extraocular Movements: Extraocular movements intact.  Cardiovascular:     Rate and Rhythm: Normal rate. Rhythm irregular.     Heart sounds: No murmur heard. Pulmonary:     Effort: Pulmonary effort is normal. No respiratory distress.  Abdominal:     General: There is no distension.  Musculoskeletal:        General: Normal  range of motion.     Cervical back: Normal range of motion.  Skin:    General: Skin is warm and dry.  Neurological:     General: No focal deficit present.     Mental Status: She is alert and oriented to person, place, and time.      Diagnostic Studies & Laboratory data:    CT chest 11/02/2021: IMPRESSION: 1. Cardiomegaly with moderate-sized pericardial effusion. 2. Dilated pulmonary artery suggesting pulmonary artery hypertension. 3. Mild interstitial edema. 4. Coronary artery disease and atherosclerotic disease. Echo 10/11/2021:  1. There is a large pericardial effusion present that is mainly located  posteriorly to the left ventricle. The effusion measures 2.5cm at  end-diastole in largest diameter. While the IVC is enlarged and does not  collapse 50% with inspiration, the RV is  well expanded with no diastolic collapse visualized. No tamponade.   2. Left ventricular ejection fraction, by estimation, is 70 to 75%. The  left ventricle has hyperdynamic function. There is moderate concentric  left ventricular hypertrophy. Diastolic function indeterminant due to  Afib.   3. Left atrial size was moderately dilated.   4. Right atrial size was severely dilated.   5. Right ventricular systolic function is mildly reduced. The right  ventricular size is mildly enlarged.   I have independently reviewed the above radiologic studies and discussed with the patient   Recent Lab Findings: Lab Results  Component Value Date   WBC 7.5 05/23/2021   HGB 13.2 05/23/2021   HCT 41.9 05/23/2021   PLT 222 05/23/2021   GLUCOSE 138 (H) 09/28/2021   CHOL 92 09/28/2021   TRIG 82 09/28/2021   HDL 32 (L) 09/28/2021   LDLCALC 44 09/28/2021   ALT 15 09/28/2021   AST 19 09/28/2021   NA 139 09/28/2021   K 3.6 09/28/2021   CL 102 09/28/2021   CREATININE 1.14 (H) 09/28/2021   BUN 16 09/28/2021   CO2 26 09/28/2021   HGBA1C 6.2 (H) 06/25/2021      Assessment / Plan:   65 year old female with  multiple medical problems including atrial fibrillation currently on Eliquis, and a positive ANA presents with a chronic pericardial effusion.  There does not appear to be a good window for pericardiocentesis.  We discussed the risks and benefits of a right robotic assisted thoracoscopy with pericardial window.  She is agreeable to proceed.  She is tentatively scheduled for November 17, 2021.     I  spent 40 minutes counseling the  patient face to face.   Declan Mier O Blanton Kardell 11/05/2021 12:00 PM

## 2021-11-05 NOTE — H&P (View-Only) (Signed)
[address]       [name] [id number]             [phone]        Wilmon ArmsFrances Keithly [facility] Record #[id number] Date of Birth: 04/10/1956  Referring: Jonelle SidleMcDowell, Samuel G, MD Primary Care: Jacquelin HawkingMcElroy, Shannon, PA-C Primary Cardiologist:Samuel Diona BrownerMcDowell, MD  Chief Complaint:    Chief Complaint  Patient presents with   Pericardial Effusion    Surgical consult, Chest CT 11/02/21, ECHO 10/11/21    History of Present Illness:     65 year old female presents for 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  She states that she has been short of breath since July of this year.  She underwent cross-sectional imaging as well as an echocardiogram which showed a posterior pericardial effusion.  She has been evaluated by rheumatology and there is concern that this may be reactive given her positive ANA.  She has been on colchicine as well as Lasix without much improvement in the effusion or symptoms.  She does have a history of atrial fibrillation and is on Eliquis.    Past Medical History:  Diagnosis Date   Essential hypertension    Hyperlipidemia    PAF (paroxysmal atrial fibrillation) (HCC)    Pericardial effusion    Type 2 diabetes mellitus (HCC)     Past Surgical History:  Procedure Laterality Date   CESAREAN SECTION      Social History: Support: Lives alone but her parents are her main support system.  Social History   Tobacco Use  Smoking Status Former   Packs/day: 0.50   Years: 3.00   Pack years: 1.50   Types: Cigarettes   Quit date: 11/07/1972   Years since quitting: 49.0  Smokeless Tobacco Never    Social History   Substance and Sexual Activity  Alcohol Use Not Currently   Comment: last use 1974     No Known Allergies   Current Outpatient Medications  Medication Sig Dispense Refill   apixaban (ELIQUIS) 5 MG TABS tablet Take 1 tablet (5 mg total) by mouth 2 (two) times daily. 180 tablet 3   atorvastatin (LIPITOR) 20  MG tablet TAKE 1 Tablet BY MOUTH ONCE EVERY DAY 90 tablet 0   Cholecalciferol (VITAMIN D3 PO) Take 1 tablet by mouth daily.     Cyanocobalamin (B-12 PO) Take 1 tablet by mouth daily.     furosemide (LASIX) 40 MG tablet Take 1 tablet (40 mg total) by mouth 2 (two) times daily. 270 tablet 3   metoprolol tartrate (LOPRESSOR) 25 MG tablet Take 1 tablet (25 mg total) by mouth 2 (two) times daily. 180 tablet 3   Omega-3 Fatty Acids (OMEGA 3 PO) Take 1 capsule by mouth daily.     potassium chloride SA (KLOR-CON) 20 MEQ tablet Take 2 tablets (40 mEq total) by mouth daily. 180 tablet 3   colchicine 0.6 MG tablet Take 1 tablet (0.6 mg total) by mouth 2 (two) times daily. (Patient not taking: Reported on 11/05/2021) 28 tablet 0   No current facility-administered medications for this visit.    (Not in a hospital admission)   Family History  Problem Relation Age of Onset   Hypertension Father    Diabetes Father    Asthma Brother    Bronchitis Brother      Review of Systems:   Review of Systems  Constitutional:  Positive for malaise/fatigue.  Respiratory:  Positive for shortness of breath.   Cardiovascular:  Positive for leg swelling. Negative for chest pain.  Musculoskeletal:  Positive for joint pain and myalgias.     Physical Exam: BP (!) 145/88    Pulse 80    Resp 20    Ht 5\' 2"  (1.575 m)    Wt 208 lb (94.3 kg)    SpO2 93% Comment: RA   BMI 38.04 kg/m  Physical Exam Constitutional:      General: She is not in acute distress.    Appearance: She is obese. She is not ill-appearing or toxic-appearing.  HENT:     Head: Normocephalic and atraumatic.  Eyes:     Extraocular Movements: Extraocular movements intact.  Cardiovascular:     Rate and Rhythm: Normal rate. Rhythm irregular.     Heart sounds: No murmur heard. Pulmonary:     Effort: Pulmonary effort is normal. No respiratory distress.  Abdominal:     General: There is no distension.  Musculoskeletal:        General: Normal  range of motion.     Cervical back: Normal range of motion.  Skin:    General: Skin is warm and dry.  Neurological:     General: No focal deficit present.     Mental Status: She is alert and oriented to person, place, and time.      Diagnostic Studies & Laboratory data:    CT chest 11/02/2021: IMPRESSION: 1. Cardiomegaly with moderate-sized pericardial effusion. 2. Dilated pulmonary artery suggesting pulmonary artery hypertension. 3. Mild interstitial edema. 4. Coronary artery disease and atherosclerotic disease. Echo 10/11/2021:  1. There is a large pericardial effusion present that is mainly located  posteriorly to the left ventricle. The effusion measures 2.5cm at  end-diastole in largest diameter. While the IVC is enlarged and does not  collapse 50% with inspiration, the RV is  well expanded with no diastolic collapse visualized. No tamponade.   2. Left ventricular ejection fraction, by estimation, is 70 to 75%. The  left ventricle has hyperdynamic function. There is moderate concentric  left ventricular hypertrophy. Diastolic function indeterminant due to  Afib.   3. Left atrial size was moderately dilated.   4. Right atrial size was severely dilated.   5. Right ventricular systolic function is mildly reduced. The right  ventricular size is mildly enlarged.   I have independently reviewed the above radiologic studies and discussed with the patient   Recent Lab Findings: Lab Results  Component Value Date   WBC 7.5 05/23/2021   HGB 13.2 05/23/2021   HCT 41.9 05/23/2021   PLT 222 05/23/2021   GLUCOSE 138 (H) 09/28/2021   CHOL 92 09/28/2021   TRIG 82 09/28/2021   HDL 32 (L) 09/28/2021   LDLCALC 44 09/28/2021   ALT 15 09/28/2021   AST 19 09/28/2021   NA 139 09/28/2021   K 3.6 09/28/2021   CL 102 09/28/2021   CREATININE 1.14 (H) 09/28/2021   BUN 16 09/28/2021   CO2 26 09/28/2021   HGBA1C 6.2 (H) 06/25/2021      Assessment / Plan:   65 year old female with  multiple medical problems including atrial fibrillation currently on Eliquis, and a positive ANA presents with a chronic pericardial effusion.  There does not appear to be a good window for pericardiocentesis.  We discussed the risks and benefits of a right robotic assisted thoracoscopy with pericardial window.  She is agreeable to proceed.  She is tentatively scheduled for November 17, 2021.     I  spent 40 minutes counseling the  patient face to face.   Bretta Fees O Kyshon Tolliver 11/05/2021 12:00 PM

## 2021-11-15 ENCOUNTER — Encounter (HOSPITAL_COMMUNITY): Payer: Self-pay

## 2021-11-15 ENCOUNTER — Encounter (HOSPITAL_COMMUNITY)
Admission: RE | Admit: 2021-11-15 | Discharge: 2021-11-15 | Disposition: A | Discharge status: Home / Self Care | Payer: Medicaid Other | Source: Ambulatory Visit | Attending: Thoracic Surgery (Cardiothoracic Vascular Surgery) | Admitting: Thoracic Surgery (Cardiothoracic Vascular Surgery)

## 2021-11-15 ENCOUNTER — Other Ambulatory Visit: Payer: Self-pay

## 2021-11-15 ENCOUNTER — Ambulatory Visit (HOSPITAL_COMMUNITY)
Admission: RE | Admit: 2021-11-15 | Discharge: 2021-11-15 | Disposition: A | Discharge status: Home / Self Care | Payer: Medicaid Other | Source: Ambulatory Visit | Attending: Thoracic Surgery (Cardiothoracic Vascular Surgery) | Admitting: Thoracic Surgery (Cardiothoracic Vascular Surgery)

## 2021-11-15 DIAGNOSIS — Z87891 Personal history of nicotine dependence: Secondary | ICD-10-CM | POA: Insufficient documentation

## 2021-11-15 DIAGNOSIS — Z01818 Encounter for other preprocedural examination: Secondary | ICD-10-CM

## 2021-11-15 DIAGNOSIS — E785 Hyperlipidemia, unspecified: Secondary | ICD-10-CM | POA: Insufficient documentation

## 2021-11-15 DIAGNOSIS — E669 Obesity, unspecified: Secondary | ICD-10-CM | POA: Insufficient documentation

## 2021-11-15 DIAGNOSIS — I5032 Chronic diastolic (congestive) heart failure: Secondary | ICD-10-CM | POA: Insufficient documentation

## 2021-11-15 DIAGNOSIS — Z6838 Body mass index (BMI) 38.0-38.9, adult: Secondary | ICD-10-CM | POA: Insufficient documentation

## 2021-11-15 DIAGNOSIS — I130 Hypertensive heart and chronic kidney disease with heart failure and stage 1 through stage 4 chronic kidney disease, or unspecified chronic kidney disease: Secondary | ICD-10-CM | POA: Insufficient documentation

## 2021-11-15 DIAGNOSIS — I480 Paroxysmal atrial fibrillation: Secondary | ICD-10-CM | POA: Insufficient documentation

## 2021-11-15 DIAGNOSIS — N189 Chronic kidney disease, unspecified: Secondary | ICD-10-CM | POA: Insufficient documentation

## 2021-11-15 DIAGNOSIS — Z20822 Contact with and (suspected) exposure to covid-19: Secondary | ICD-10-CM | POA: Insufficient documentation

## 2021-11-15 DIAGNOSIS — I3139 Other pericardial effusion (noninflammatory): Secondary | ICD-10-CM | POA: Insufficient documentation

## 2021-11-15 DIAGNOSIS — E1122 Type 2 diabetes mellitus with diabetic chronic kidney disease: Secondary | ICD-10-CM | POA: Insufficient documentation

## 2021-11-15 HISTORY — DX: Heart failure, unspecified: I50.9

## 2021-11-15 HISTORY — DX: Pneumonia, unspecified organism: J18.9

## 2021-11-15 HISTORY — DX: Chronic kidney disease, unspecified: N18.9

## 2021-11-15 LAB — COMPREHENSIVE METABOLIC PANEL
ALT: 17 U/L (ref 0–44)
AST: 23 U/L (ref 15–41)
Albumin: 3.8 g/dL (ref 3.5–5.0)
Alkaline Phosphatase: 85 U/L (ref 38–126)
Anion gap: 6 (ref 5–15)
BUN: 20 mg/dL (ref 8–23)
CO2: 26 mmol/L (ref 22–32)
Calcium: 8.9 mg/dL (ref 8.9–10.3)
Chloride: 105 mmol/L (ref 98–111)
Creatinine, Ser: 1.22 mg/dL — ABNORMAL HIGH (ref 0.44–1.00)
GFR, Estimated: 50 mL/min — ABNORMAL LOW (ref >60)
Glucose, Bld: 94 mg/dL (ref 70–99)
Potassium: 4.1 mmol/L (ref 3.5–5.1)
Sodium: 137 mmol/L (ref 135–145)
Total Bilirubin: 1.9 mg/dL — ABNORMAL HIGH (ref 0.3–1.2)
Total Protein: 6.7 g/dL (ref 6.5–8.1)

## 2021-11-15 LAB — CBC
HCT: 51.4 % — ABNORMAL HIGH (ref 36.0–46.0)
Hemoglobin: 16.6 g/dL — ABNORMAL HIGH (ref 12.0–15.0)
MCH: 31 pg (ref 26.0–34.0)
MCHC: 32.3 g/dL (ref 30.0–36.0)
MCV: 96.1 fL (ref 80.0–100.0)
Platelets: 224 10*3/uL (ref 150–400)
RBC: 5.35 MIL/uL — ABNORMAL HIGH (ref 3.87–5.11)
RDW: 16.2 % — ABNORMAL HIGH (ref 11.5–15.5)
WBC: 5.7 10*3/uL (ref 4.0–10.5)
nRBC: 0 % (ref 0.0–0.2)

## 2021-11-15 LAB — GLUCOSE, CAPILLARY: Glucose-Capillary: 145 mg/dL — ABNORMAL HIGH (ref 70–99)

## 2021-11-15 LAB — TYPE AND SCREEN
ABO/RH(D): O POS
Antibody Screen: NEGATIVE

## 2021-11-15 LAB — URINALYSIS, ROUTINE W REFLEX MICROSCOPIC
Bilirubin Urine: NEGATIVE
Glucose, UA: NEGATIVE mg/dL
Hgb urine dipstick: NEGATIVE
Ketones, ur: NEGATIVE mg/dL
Leukocytes,Ua: NEGATIVE
Nitrite: NEGATIVE
Protein, ur: NEGATIVE mg/dL
Specific Gravity, Urine: 1.015 (ref 1.005–1.030)
pH: 6 (ref 5.0–8.0)

## 2021-11-15 LAB — APTT: aPTT: 34 seconds (ref 24–36)

## 2021-11-15 LAB — BLOOD GAS, ARTERIAL
Acid-base deficit: 2 mmol/L (ref 0.0–2.0)
Bicarbonate: 22 mmol/L (ref 20.0–28.0)
Drawn by: 58793
FIO2: 21
O2 Saturation: 89.4 %
Patient temperature: 37
pCO2 arterial: 35.7 mmHg (ref 32.0–48.0)
pH, Arterial: 7.406 (ref 7.350–7.450)
pO2, Arterial: 60.4 mmHg — ABNORMAL LOW (ref 83.0–108.0)

## 2021-11-15 LAB — HEMOGLOBIN A1C
Hgb A1c MFr Bld: 6.8 % — ABNORMAL HIGH (ref 4.8–5.6)
Mean Plasma Glucose: 148.46 mg/dL

## 2021-11-15 LAB — PROTIME-INR
INR: 1.2 (ref 0.8–1.2)
Prothrombin Time: 15.4 seconds — ABNORMAL HIGH (ref 11.4–15.2)

## 2021-11-15 LAB — SURGICAL PCR SCREEN
MRSA, PCR: NEGATIVE
Staphylococcus aureus: NEGATIVE

## 2021-11-15 NOTE — Progress Notes (Signed)
Surgical Instructions   Your procedure is scheduled on Wednesday 11/17/2021.  Report to [facility] Main Entrance "A" at 12:00 P.M., then check in with the Admitting office.  Call [phone] if you have problems or questions between now and the morning of surgery:   Remember: Do not eat or drink after midnight the night before your surgery    Take these medicines the morning of surgery with A SIP OF WATER:  Atorvastatin (Lipitor) Metoprolol tartrate (Lopressor)    As of today, STOP taking any Aspirin (unless otherwise instructed by your surgeon) or Aspirin-containing products; NSAIDS - Aleve, Naproxen, Ibuprofen, Motrin, Advil, Goody's, BC's, all herbal medications, fish oil, and all vitamins.  Follow your surgeon's instructions on when to stop Apixaban (Eliquis).  If no instructions were given by your surgeon then you will need to call the office to get those instructions.     After your pre-procedure COVID test You are not required to quarantine however you are required to wear a well-fitting mask when you are out and around people not in your household.  If your mask becomes wet or soiled, replace with a new one.  Wash your hands often with soap and water for 20 seconds or clean your hands with an alcohol-based hand sanitizer that contains at least 60% alcohol.  Do not share personal items.  Notify your provider: if you are in close contact with someone who has COVID  or if you develop a fever of 100.4 or greater, sneezing, cough, sore throat, shortness of breath or body aches.          Do not wear jewelry or makeup  Do not wear lotions, powders, perfumes/colognes, or deodorant.  Do not shave 48 hours prior to surgery.  Men may shave face and neck.  Do not wear nail polish, gel polish, artificial nails, or any other type of covering on natural nails including fingernails and toenails. If patients have artificial nails, gel coating, etc. that need to be removed by a nail  salon please have this removed prior to surgery or surgery may need to be canceled/delayed if the surgeon/ anesthesia feels like the patient is unable to be adequately monitored.  Do not bring valuables to the hospital - [facility] is not responsible for any belongings or valuables.  Do NOT Smoke (Tobacco/Vaping) or drink Alcohol 24 hours prior to your procedure  If you use a CPAP at night, please bring your mask for your overnight stay.   Contacts, glasses, hearing aids, dentures or partials may not be worn into surgery, please bring cases for these belongings   For patients admitted to the hospital, discharge time will be determined by your treatment team.   Patients discharged the day of surgery will not be allowed to drive home, and someone needs to stay with them for 24 hours.  NO VISITORS WILL BE ALLOWED IN PRE-OP WHERE PATIENTS ARE PREPPED FOR SURGERY.  ONLY 1 SUPPORT PERSON MAY BE PRESENT IN THE WAITING ROOM WHILE YOU ARE IN SURGERY.  IF YOU ARE TO BE ADMITTED, ONCE YOU ARE IN YOUR ROOM YOU WILL BE ALLOWED TWO (2) VISITORS. 1 (ONE) VISITOR MAY STAY OVERNIGHT BUT MUST ARRIVE TO THE ROOM BY 8pm.  Minor children may have two parents present. Special consideration for safety and communication needs will be reviewed on a case by case basis.  Special instructions:    Oral Hygiene is also important to reduce your risk of infection.  Remember - BRUSH YOUR TEETH THE  MORNING OF SURGERY WITH YOUR REGULAR TOOTHPASTE   [facility]- Preparing For Surgery  Before surgery, you can play an important role. Because skin is not sterile, your skin needs to be as free of germs as possible. You can reduce the number of germs on your skin by washing with CHG (chlorahexidine gluconate) Soap before surgery.  CHG is an antiseptic cleaner which kills germs and bonds with the skin to continue killing germs even after washing.     Please do not use if you have an allergy to CHG or antibacterial soaps. If your  skin becomes reddened/irritated stop using the CHG.  Do not shave (including legs and underarms) for at least 48 hours prior to first CHG shower. It is OK to shave your face.  Please follow these instructions carefully.     Shower the NIGHT BEFORE SURGERY and the MORNING OF SURGERY with CHG Soap.   If you chose to wash your hair, wash your hair first as usual with your normal shampoo. After you shampoo, rinse your hair and body thoroughly to remove the shampoo.    Then [employer] and genitals (private parts) with your normal soap and rinse thoroughly to remove soap.  Next use the CHG Soap as you would any other liquid soap. You can apply CHG directly to the skin and wash gently with a clean washcloth.   Apply the CHG Soap to your body ONLY FROM THE NECK DOWN.  Do not use on open wounds or open sores. Avoid contact with your eyes, ears, mouth and genitals (private parts). Wash Face and genitals (private parts)  with your normal soap.   Wash thoroughly, paying special attention to the area where your surgery will be performed.  Thoroughly rinse your body with warm water from the neck down.  DO NOT shower/wash with your normal soap after using and rinsing off the CHG Soap.  Pat yourself dry with a CLEAN TOWEL.  Wear CLEAN PAJAMAS to bed the night before surgery  Place CLEAN SHEETS on your bed the night before your surgery  DO NOT SLEEP WITH PETS.   Day of Surgery:  Take a shower with CHG soap. Wear Clean/Comfortable clothing the morning of surgery Do not apply any deodorants/lotions.   Remember to brush your teeth WITH YOUR REGULAR TOOTHPASTE.   Please read over the fact sheets that you were given.

## 2021-11-15 NOTE — Progress Notes (Signed)
PCP - Soyla Dryer, PA Cardiologist - Dr. Rozann Lesches  PPM/ICD -  Device Orders -  Rep Notified -   Chest x-ray - 11/15/21 EKG - 11/15/21 Stress Test - patient denies ECHO - 10/11/21 Cardiac Cath - patient denies  Sleep Study -  CPAP -   Fasting Blood Sugar - 84-105 Checks Blood Sugar 3 times a week  Blood Thinner Instructions: Eliquis - last dose taken on 11/13/21 Aspirin Instructions:  ERAS Protcol - NPO after midnight per surgeon PRE-SURGERY Ensure or G2-   COVID TEST- at PAT   Anesthesia review: yes  Patient denies shortness of breath, fever, cough and chest pain at PAT appointment   All instructions explained to the patient, with a verbal understanding of the material. Patient agrees to go over the instructions while at home for a better understanding. Patient also instructed to self quarantine after being tested for COVID-19. The opportunity to ask questions was provided.

## 2021-11-16 ENCOUNTER — Encounter (HOSPITAL_COMMUNITY): Payer: Self-pay

## 2021-11-16 LAB — SARS CORONAVIRUS 2 (TAT 6-24 HRS): SARS Coronavirus 2: NEGATIVE

## 2021-11-16 NOTE — Progress Notes (Signed)
Anesthesia Chart Review:  Case: [id number] Date/Time: 11/17/21 1345   Procedure: RIGHT XI ROBOTIC ASSISTED THORACOSCOPY PERICARDIAL WINDOW (Right)   Anesthesia type: General   Pre-op diagnosis: pericardial effusion   Location: [facility] OR ROOM 10 / [place] OR   Surgeons: Lajuana Matte, MD       DISCUSSION: Patient is a 66 year old female scheduled for the above procedure. She has had a persistent pericardial effusion since at least 05/2020. She did not have significant improvement with Lasix or a trial of colchicine. 10/11/21 echo showed increase in effusion from moderate to large since July, but no evidence of tamponade. Dr. Domenic Polite recommended referral to CT surgery for consideration of pericardial window.  History includes former smoker (quit 11/07/72), HTN, HLD, DM2, chronic diastolic CHF, afib (ablation > 5 years ago, recurrence 05/2021), persistent pericardial effusion (since at least 05/2020, no improvement with colchicine and/or Lasix), CKD.  BMI is consistent with obesity.  She moved to [place] from [place] around 2021.   Reported last Eliquis 11/13/21.  11/15/2021 presurgical COVID-19 test negative.  Anesthesia team to evaluate on the day of surgery.   VS: BP (!) 141/100    Pulse 80    Temp 36.6 C    Resp 19    Ht 5\' 2"  (1.575 m)    Wt 96.4 kg    SpO2 (!) 87%    BMI 38.89 kg/m  BP Readings from Last 3 Encounters:  11/15/21 (!) 141/100  11/05/21 (!) 145/88  10/20/21 (!) 135/101     PROVIDERS: Soyla Dryer, PA-C is PCP   - Rozann Lesches, MD is cardiologist. Last visit with Bernerd Pho, PA-C on 08/27/21. Continue Eliquis for persistent afib. DOE likely multifactorial given HFpEF, pericardial effusion, obesity, and possible OSA. Lasix titrated. Limited echo ordered and if EF reduced would consider further evaluation. She had home sleep study ordered. Referred to rheumatology for + ANA. Limited echo was done on 10/11/21 which lead to CT surgery referral for enlarging pericardial  effusion.  - Kara Mead, MD is pulmonologist. Visit 08/25/21. A home sleep study is planned.   Vernelle Emerald, MD is rheumatologist. Evaluated 12/1 22 due to + ANA in setting of moderate pericardial effusion, hypoxia, pulmonary hypertension. Additional lab ordered what were "negative or within normal range:. He did not see any specific clinical signs of systemic inflammation and did not think her + ANA represented active underlying systemic autoimmune disease as the cause of her pericardial effusion.    LABS: Preoperative labs noted. Cr stable at 1.22. Total bili 1.9, down from 2.9 on 09/28/21. AST/ALT normal. PT 15.4, with normal INR 1.2. A1c 6.8%.  (all labs ordered are listed, but only abnormal results are displayed)  Labs Reviewed  GLUCOSE, CAPILLARY - Abnormal; Notable for the following components:      Result Value   Glucose-Capillary 145 (*)    All other components within normal limits  CBC - Abnormal; Notable for the following components:   RBC 5.35 (*)    Hemoglobin 16.6 (*)    HCT 51.4 (*)    RDW 16.2 (*)    All other components within normal limits  COMPREHENSIVE METABOLIC PANEL - Abnormal; Notable for the following components:   Creatinine, Ser 1.22 (*)    Total Bilirubin 1.9 (*)    GFR, Estimated 50 (*)    All other components within normal limits  BLOOD GAS, ARTERIAL - Abnormal; Notable for the following components:   pO2, Arterial 60.4 (*)  All other components within normal limits  PROTIME-INR - Abnormal; Notable for the following components:   Prothrombin Time 15.4 (*)    All other components within normal limits  HEMOGLOBIN A1C - Abnormal; Notable for the following components:   Hgb A1c MFr Bld 6.8 (*)    All other components within normal limits  SURGICAL PCR SCREEN  SARS CORONAVIRUS 2 (TAT 6-24 HRS)  APTT  URINALYSIS, ROUTINE W REFLEX MICROSCOPIC  TYPE AND SCREEN     IMAGES: CXR 11/15/21: FINDINGS: - Cardiac diameter remains enlarged, with  straightening of the left heart border. - No interlobular septal thickening.  No pneumothorax. - No pleural effusion. - Lateral view demonstrates a circumferential differential lucency about the heart compatible with pericardial fluid/effusion. - No new confluent airspace disease. - No displaced fracture. IMPRESSION: Similar appearance of the chest x-ray with cardiomegaly and evidence of persisting pericardial effusion.  CT Chest 11/02/21: IMPRESSION: 1. Cardiomegaly with moderate-sized pericardial effusion. 2. Dilated pulmonary artery suggesting pulmonary artery hypertension. 3. Mild interstitial edema. 4. Coronary artery disease and atherosclerotic disease.    VQ Scan 05/21/21: IMPRESSION: Negative for pulmonary embolus.  EKG: 11/15/21: Atrial fibrillation at 77 bpm Minimal voltage criteria for LVH, may be normal variant ( Cornell product ) T wave abnormality, consider lateral ischemia Abnormal ECG When compared with ECG of 21-May-2021 09:37, PREVIOUS ECG IS PRESENT Twave changes new since previous Confirmed by Olga Millers ([id number]) on 11/15/2021 12:30:53 PM - T wave changes new or at least more pronounced when compared to 26-May-2021 tracing that showed mildly negative T waves in high lateral leads. EKG tracing summary from 11/28/17 showed afib, voltage criteria for LVH, and moderate T wave abnormality, consider lateral ischemia.     CV: Echo (limited) 10/11/21: IMPRESSIONS   1. There is a large pericardial effusion present that is mainly located  posteriorly to the left ventricle. The effusion measures 2.5cm at  end-diastole in largest diameter. While the IVC is enlarged and does not  collapse 50% with inspiration, the RV is  well expanded with no diastolic collapse visualized. No tamponade.   2. Left ventricular ejection fraction, by estimation, is 70 to 75%. The  left ventricle has hyperdynamic function. There is moderate concentric  left ventricular hypertrophy.  Diastolic function indeterminant due to  Afib.   3. Left atrial size was moderately dilated.   4. Right atrial size was severely dilated.   5. Right ventricular systolic function is mildly reduced. The right  ventricular size is mildly enlarged.  - Comparison(s): Compared to prior TTE in 05-26-21, the effusion appears  slightly larger in size but there is no evidence of tamponade.    Echo 05/21/21: IMPRESSIONS   1. Left ventricular ejection fraction, by estimation, is 70 to 75%. The  left ventricle has hyperdynamic function. The left ventricle has no  regional wall motion abnormalities. There is moderate left ventricular  hypertrophy. Left ventricular diastolic  parameters are indeterminate.   2. Right ventricular systolic function is low normal. The right  ventricular size is mildly enlarged. There is severely elevated pulmonary  artery systolic pressure.   3. Left atrial size was mild to moderately dilated.   4. Right atrial size was mildly dilated.   5. Moderate pericardial effusion. The pericardial effusion is  circumferential. There is no evidence of cardiac tamponade.   6. The mitral valve is normal in structure. Trivial mitral valve  regurgitation. No evidence of mitral stenosis.   7. The aortic valve is tricuspid. There is mild  calcification of the  aortic valve. There is mild thickening of the aortic valve. Aortic valve  regurgitation is not visualized. No aortic stenosis is present.   8. The inferior vena cava is dilated in size with >50% respiratory  variability, suggesting right atrial pressure of 8 mmHg.    LE Venous US 05/21/21: IMPRESSION: No lower extremity DVT   US Carotid 06/02/20: IMPRESSION: - Minor carotid atherosclerosis. No hemodynamically significant ICA stenosis. Degree of narrowing less than 50% bilaterally by ultrasound criteria. - Patent antegrade vertebral flow bilaterally   Past Medical History:  Diagnosis Date   CHF (congestive heart failure)  (HCC)    CKD (chronic kidney disease)    Essential hypertension    Hyperlipidemia    PAF (paroxysmal atrial fibrillation) (HCC)    Pericardial effusion    Pneumonia    Type 2 diabetes mellitus (HCC)     Past Surgical History:  Procedure Laterality Date   CESAREAN SECTION     HERNIA REPAIR      MEDICATIONS:  apixaban (ELIQUIS) 5 MG TABS tablet   atorvastatin (LIPITOR) 20 MG tablet   Cholecalciferol (VITAMIN D3 PO)   colchicine 0.6 MG tablet   Cyanocobalamin (B-12 PO)   furosemide (LASIX) 40 MG tablet   metoprolol tartrate (LOPRESSOR) 25 MG tablet   Omega-3 Fatty Acids (OMEGA 3 PO)   potassium chloride SA (KLOR-CON) 20 MEQ tablet   No current facility-administered medications for this encounter.    Myra Gianotti, PA-C Surgical Short Stay/Anesthesiology [facility] Phone [phone] [facility] Phone [phone] 11/16/2021 11:07 AM

## 2021-11-16 NOTE — Anesthesia Preprocedure Evaluation (Addendum)
Anesthesia Evaluation  Patient identified by MRN, date of birth, ID band Patient awake    Reviewed: Allergy & Precautions, NPO status , Patient's Chart, lab work & pertinent test results  Airway Mallampati: II  TM Distance: >3 FB Neck ROM: Full    Dental  (+) Dental Advisory Given   Pulmonary former smoker,    breath sounds clear to auscultation       Cardiovascular hypertension, Pt. on medications and Pt. on home beta blockers +CHF   Rhythm:Regular Rate:Normal  Echo (limited) 10/11/21: IMPRESSIONS  1. There is a large pericardial effusion present that is mainly located  posteriorly to the left ventricle. The effusion measures 2.5cm at  end-diastole in largest diameter. While the IVC is enlarged and does not  collapse 50% with inspiration, the RV is  well expanded with no diastolic collapse visualized. No tamponade.  2. Left ventricular ejection fraction, by estimation, is 70 to 75%. The  left ventricle has hyperdynamic function. There is moderate concentric  left ventricular hypertrophy. Diastolic function indeterminant due to  Afib.  3. Left atrial size was moderately dilated.  4. Right atrial size was severely dilated.  5. Right ventricular systolic function is mildly reduced. The right  ventricular size is mildly enlarged.  - Comparison(s): Compared to prior TTE in 05/2021, the effusion appears  slightly larger in size but there is no evidence of tamponade.     Neuro/Psych negative neurological ROS     GI/Hepatic negative GI ROS, Neg liver ROS,   Endo/Other  diabetes, Type 2  Renal/GU Renal InsufficiencyRenal disease     Musculoskeletal   Abdominal   Peds  Hematology negative hematology ROS (+)   Anesthesia Other Findings   Reproductive/Obstetrics                            Anesthesia Physical Anesthesia Plan  ASA: 3  Anesthesia Plan: General   Post-op Pain Management:  Tylenol PO (pre-op) and Minimal or no pain anticipated   Induction: Intravenous  PONV Risk Score and Plan: 3 and Dexamethasone, Ondansetron and Treatment may vary due to age or medical condition  Airway Management Planned: Oral ETT  Additional Equipment: Arterial line  Intra-op Plan:   Post-operative Plan: Extubation in OR  Informed Consent: I have reviewed the patients History and Physical, chart, labs and discussed the procedure including the risks, benefits and alternatives for the proposed anesthesia with the patient or authorized representative who has indicated his/her understanding and acceptance.     Dental advisory given  Plan Discussed with:   Anesthesia Plan Comments: ( )       Anesthesia Quick Evaluation

## 2021-11-17 ENCOUNTER — Inpatient Hospital Stay (HOSPITAL_COMMUNITY): Payer: Self-pay | Admitting: Certified Registered Nurse Anesthetist

## 2021-11-17 ENCOUNTER — Encounter (HOSPITAL_COMMUNITY)
Admission: RE | Disposition: A | Discharge status: Home Health | Payer: Self-pay | Source: Ambulatory Visit | Attending: Thoracic Surgery (Cardiothoracic Vascular Surgery)

## 2021-11-17 ENCOUNTER — Other Ambulatory Visit: Payer: Self-pay

## 2021-11-17 ENCOUNTER — Inpatient Hospital Stay (HOSPITAL_COMMUNITY): Payer: Self-pay

## 2021-11-17 ENCOUNTER — Inpatient Hospital Stay (HOSPITAL_COMMUNITY): Payer: Self-pay | Admitting: Vascular Surgery

## 2021-11-17 ENCOUNTER — Encounter (HOSPITAL_COMMUNITY): Payer: Self-pay | Admitting: Thoracic Surgery (Cardiothoracic Vascular Surgery)

## 2021-11-17 ENCOUNTER — Inpatient Hospital Stay (HOSPITAL_COMMUNITY)
Admission: RE | Admit: 2021-11-17 | Discharge: 2021-11-23 | DRG: 271 | Disposition: A | Discharge status: Home Health | Payer: Self-pay | Source: Ambulatory Visit | Attending: Thoracic Surgery (Cardiothoracic Vascular Surgery) | Admitting: Thoracic Surgery (Cardiothoracic Vascular Surgery)

## 2021-11-17 DIAGNOSIS — Z7901 Long term (current) use of anticoagulants: Secondary | ICD-10-CM

## 2021-11-17 DIAGNOSIS — K5900 Constipation, unspecified: Secondary | ICD-10-CM | POA: Diagnosis present

## 2021-11-17 DIAGNOSIS — J939 Pneumothorax, unspecified: Secondary | ICD-10-CM

## 2021-11-17 DIAGNOSIS — I3139 Other pericardial effusion (noninflammatory): Secondary | ICD-10-CM

## 2021-11-17 DIAGNOSIS — E785 Hyperlipidemia, unspecified: Secondary | ICD-10-CM | POA: Diagnosis present

## 2021-11-17 DIAGNOSIS — Z9889 Other specified postprocedural states: Secondary | ICD-10-CM

## 2021-11-17 DIAGNOSIS — I4892 Unspecified atrial flutter: Secondary | ICD-10-CM | POA: Diagnosis present

## 2021-11-17 DIAGNOSIS — Z8249 Family history of ischemic heart disease and other diseases of the circulatory system: Secondary | ICD-10-CM

## 2021-11-17 DIAGNOSIS — I2510 Atherosclerotic heart disease of native coronary artery without angina pectoris: Secondary | ICD-10-CM | POA: Diagnosis present

## 2021-11-17 DIAGNOSIS — E669 Obesity, unspecified: Secondary | ICD-10-CM | POA: Diagnosis present

## 2021-11-17 DIAGNOSIS — Z87891 Personal history of nicotine dependence: Secondary | ICD-10-CM

## 2021-11-17 DIAGNOSIS — I5022 Chronic systolic (congestive) heart failure: Secondary | ICD-10-CM | POA: Diagnosis present

## 2021-11-17 DIAGNOSIS — Z79899 Other long term (current) drug therapy: Secondary | ICD-10-CM

## 2021-11-17 DIAGNOSIS — I480 Paroxysmal atrial fibrillation: Secondary | ICD-10-CM | POA: Diagnosis present

## 2021-11-17 DIAGNOSIS — E11649 Type 2 diabetes mellitus with hypoglycemia without coma: Secondary | ICD-10-CM | POA: Diagnosis not present

## 2021-11-17 DIAGNOSIS — M79605 Pain in left leg: Secondary | ICD-10-CM | POA: Diagnosis not present

## 2021-11-17 DIAGNOSIS — R0902 Hypoxemia: Secondary | ICD-10-CM | POA: Diagnosis present

## 2021-11-17 DIAGNOSIS — Z6838 Body mass index (BMI) 38.0-38.9, adult: Secondary | ICD-10-CM

## 2021-11-17 DIAGNOSIS — I110 Hypertensive heart disease with heart failure: Secondary | ICD-10-CM | POA: Diagnosis present

## 2021-11-17 HISTORY — PX: XI ROBOTIC ASSISTED PERICARDIAL WINDOW: SHX6870

## 2021-11-17 LAB — GLUCOSE, CAPILLARY
Glucose-Capillary: 86 mg/dL (ref 70–99)
Glucose-Capillary: 74 mg/dL (ref 70–99)
Glucose-Capillary: 108 mg/dL — ABNORMAL HIGH (ref 70–99)

## 2021-11-17 LAB — ABO/RH: ABO/RH(D): O POS

## 2021-11-17 SURGERY — CREATION, PERICARDIAL WINDOW, ROBOT-ASSISTED
Anesthesia: General | Laterality: Right

## 2021-11-17 MED ORDER — METOPROLOL TARTRATE 25 MG PO TABS
25.0000 mg | ORAL_TABLET | Freq: Two times a day (BID) | ORAL | Status: DC
  Administered 2021-11-18 – 2021-11-23 (×10): 25 mg via ORAL
  Filled 2021-11-17 (×11): qty 1

## 2021-11-17 MED ORDER — CEFAZOLIN SODIUM-DEXTROSE 2-4 GM/100ML-% IV SOLN
2.0000 g | Freq: Three times a day (TID) | INTRAVENOUS | Status: AC
  Administered 2021-11-17 – 2021-11-18 (×2): 2 g via INTRAVENOUS
  Filled 2021-11-17 (×3): qty 100

## 2021-11-17 MED ORDER — FENTANYL CITRATE (PF) 250 MCG/5ML IJ SOLN
INTRAMUSCULAR | Status: DC | PRN
Start: 2021-11-17 — End: 2021-11-17
  Administered 2021-11-17: 50 ug via INTRAVENOUS
  Administered 2021-11-17: 100 ug via INTRAVENOUS

## 2021-11-17 MED ORDER — ACETAMINOPHEN 500 MG PO TABS
1000.0000 mg | ORAL_TABLET | Freq: Once | ORAL | Status: AC
  Administered 2021-11-17: 1000 mg via ORAL
  Filled 2021-11-17: qty 2

## 2021-11-17 MED ORDER — ORAL CARE MOUTH RINSE
15.0000 mL | Freq: Two times a day (BID) | OROMUCOSAL | Status: DC
  Administered 2021-11-17 – 2021-11-23 (×8): 15 mL via OROMUCOSAL

## 2021-11-17 MED ORDER — LACTATED RINGERS IV SOLN: INTRAVENOUS | Status: DC | PRN

## 2021-11-17 MED ORDER — AMISULPRIDE (ANTIEMETIC) 5 MG/2ML IV SOLN: 10.0000 mg | Freq: Once | INTRAVENOUS | Status: DC | PRN

## 2021-11-17 MED ORDER — SUGAMMADEX SODIUM 200 MG/2ML IV SOLN
INTRAVENOUS | Status: DC | PRN
  Administered 2021-11-17: 100 mg via INTRAVENOUS
  Administered 2021-11-17: 200 mg via INTRAVENOUS
  Administered 2021-11-17: 100 mg via INTRAVENOUS

## 2021-11-17 MED ORDER — CHLORHEXIDINE GLUCONATE 0.12 % MT SOLN: 15.0000 mL | Freq: Once | OROMUCOSAL | Status: AC

## 2021-11-17 MED ORDER — PROPOFOL 10 MG/ML IV BOLUS
INTRAVENOUS | Status: DC | PRN
Start: 2021-11-17 — End: 2021-11-17
  Administered 2021-11-17: 70 mg via INTRAVENOUS

## 2021-11-17 MED ORDER — PHENYLEPHRINE HCL (PRESSORS) 10 MG/ML IV SOLN
INTRAVENOUS | Status: DC | PRN
Start: 2021-11-17 — End: 2021-11-17
  Administered 2021-11-17: 120 ug via INTRAVENOUS
  Administered 2021-11-17 (×2): 40 ug via INTRAVENOUS
  Administered 2021-11-17: 80 ug via INTRAVENOUS

## 2021-11-17 MED ORDER — CEFAZOLIN SODIUM-DEXTROSE 2-4 GM/100ML-% IV SOLN
2.0000 g | INTRAVENOUS | Status: AC
  Administered 2021-11-17: 2 g via INTRAVENOUS

## 2021-11-17 MED ORDER — ALBUTEROL SULFATE (2.5 MG/3ML) 0.083% IN NEBU
2.5000 mg | INHALATION_SOLUTION | Freq: Once | RESPIRATORY_TRACT | Status: AC
  Administered 2021-11-17: 2.5 mg via RESPIRATORY_TRACT

## 2021-11-17 MED ORDER — FENTANYL CITRATE (PF) 250 MCG/5ML IJ SOLN
INTRAMUSCULAR | Status: AC
  Filled 2021-11-17: qty 5

## 2021-11-17 MED ORDER — FENTANYL CITRATE (PF) 100 MCG/2ML IJ SOLN
25.0000 ug | INTRAMUSCULAR | Status: DC | PRN
  Administered 2021-11-17 (×2): 50 ug via INTRAVENOUS

## 2021-11-17 MED ORDER — SODIUM CHLORIDE 0.9 % IV SOLN: INTRAVENOUS | Status: DC | PRN

## 2021-11-17 MED ORDER — ONDANSETRON HCL 4 MG/2ML IJ SOLN
INTRAMUSCULAR | Status: DC | PRN
  Administered 2021-11-17: 4 mg via INTRAVENOUS

## 2021-11-17 MED ORDER — HEMOSTATIC AGENTS (NO CHARGE) OPTIME
TOPICAL | Status: DC | PRN
  Administered 2021-11-17: 1 via TOPICAL

## 2021-11-17 MED ORDER — BUPIVACAINE HCL (PF) 0.5 % IJ SOLN
INTRAMUSCULAR | Status: AC
  Filled 2021-11-17: qty 30

## 2021-11-17 MED ORDER — SENNOSIDES-DOCUSATE SODIUM 8.6-50 MG PO TABS
1.0000 | ORAL_TABLET | Freq: Every day | ORAL | Status: DC
  Administered 2021-11-17 – 2021-11-21 (×5): 1 via ORAL
  Filled 2021-11-17 (×5): qty 1

## 2021-11-17 MED ORDER — BISACODYL 5 MG PO TBEC
10.0000 mg | DELAYED_RELEASE_TABLET | Freq: Every day | ORAL | Status: DC
  Administered 2021-11-17 – 2021-11-22 (×4): 10 mg via ORAL
  Filled 2021-11-17 (×6): qty 2

## 2021-11-17 MED ORDER — LIDOCAINE HCL (CARDIAC) PF 100 MG/5ML IV SOSY
PREFILLED_SYRINGE | INTRAVENOUS | Status: DC | PRN
  Administered 2021-11-17: 80 mg via INTRATRACHEAL

## 2021-11-17 MED ORDER — ACETAMINOPHEN 500 MG PO TABS
1000.0000 mg | ORAL_TABLET | Freq: Four times a day (QID) | ORAL | Status: AC
  Administered 2021-11-17 – 2021-11-22 (×14): 1000 mg via ORAL
  Filled 2021-11-17 (×15): qty 2

## 2021-11-17 MED ORDER — MIDAZOLAM HCL 2 MG/2ML IJ SOLN
INTRAMUSCULAR | Status: AC
  Filled 2021-11-17: qty 2

## 2021-11-17 MED ORDER — DEXAMETHASONE SODIUM PHOSPHATE 10 MG/ML IJ SOLN
INTRAMUSCULAR | Status: DC | PRN
  Administered 2021-11-17: 5 mg via INTRAVENOUS

## 2021-11-17 MED ORDER — BUPIVACAINE LIPOSOME 1.3 % IJ SUSP
INTRAMUSCULAR | Status: AC
  Filled 2021-11-17: qty 20

## 2021-11-17 MED ORDER — MORPHINE SULFATE (PF) 2 MG/ML IV SOLN
1.0000 mg | INTRAVENOUS | Status: DC | PRN
  Administered 2021-11-17 – 2021-11-18 (×3): 2 mg via INTRAVENOUS
  Filled 2021-11-17 (×3): qty 1

## 2021-11-17 MED ORDER — ACETAMINOPHEN 160 MG/5ML PO SOLN: 1000.0000 mg | Freq: Four times a day (QID) | ORAL | Status: AC

## 2021-11-17 MED ORDER — INSULIN ASPART 100 UNIT/ML IJ SOLN
0.0000 [IU] | Freq: Four times a day (QID) | INTRAMUSCULAR | Status: DC
  Administered 2021-11-18: 4 [IU] via SUBCUTANEOUS

## 2021-11-17 MED ORDER — CHLORHEXIDINE GLUCONATE 0.12 % MT SOLN
OROMUCOSAL | Status: AC
  Administered 2021-11-17: 15 mL via OROMUCOSAL
  Filled 2021-11-17: qty 15

## 2021-11-17 MED ORDER — ONDANSETRON HCL 4 MG/2ML IJ SOLN
4.0000 mg | Freq: Four times a day (QID) | INTRAMUSCULAR | Status: DC | PRN
  Administered 2021-11-19 – 2021-11-21 (×3): 4 mg via INTRAVENOUS
  Filled 2021-11-17 (×3): qty 2

## 2021-11-17 MED ORDER — TRAMADOL HCL 50 MG PO TABS
50.0000 mg | ORAL_TABLET | Freq: Four times a day (QID) | ORAL | Status: DC | PRN
  Administered 2021-11-18 – 2021-11-21 (×3): 100 mg via ORAL
  Filled 2021-11-17 (×3): qty 2

## 2021-11-17 MED ORDER — LACTATED RINGERS IV SOLN: INTRAVENOUS | Status: DC

## 2021-11-17 MED ORDER — ROCURONIUM BROMIDE 100 MG/10ML IV SOLN
INTRAVENOUS | Status: DC | PRN
  Administered 2021-11-17: 60 mg via INTRAVENOUS
  Administered 2021-11-17: 20 mg via INTRAVENOUS

## 2021-11-17 MED ORDER — 0.9 % SODIUM CHLORIDE (POUR BTL) OPTIME
TOPICAL | Status: DC | PRN
  Administered 2021-11-17: 2000 mL

## 2021-11-17 MED ORDER — FENTANYL CITRATE (PF) 100 MCG/2ML IJ SOLN
INTRAMUSCULAR | Status: AC
  Filled 2021-11-17: qty 2

## 2021-11-17 MED ORDER — KETOROLAC TROMETHAMINE 15 MG/ML IJ SOLN
15.0000 mg | Freq: Four times a day (QID) | INTRAMUSCULAR | Status: DC
  Administered 2021-11-17 – 2021-11-18 (×2): 15 mg via INTRAVENOUS
  Filled 2021-11-17 (×2): qty 1

## 2021-11-17 MED ORDER — ATORVASTATIN CALCIUM 10 MG PO TABS
20.0000 mg | ORAL_TABLET | Freq: Every day | ORAL | Status: DC
  Administered 2021-11-18 – 2021-11-23 (×5): 20 mg via ORAL
  Filled 2021-11-17 (×6): qty 2

## 2021-11-17 MED ORDER — CEFAZOLIN SODIUM-DEXTROSE 2-4 GM/100ML-% IV SOLN
INTRAVENOUS | Status: AC
  Filled 2021-11-17: qty 100

## 2021-11-17 MED ORDER — PROPOFOL 10 MG/ML IV BOLUS
INTRAVENOUS | Status: AC
  Filled 2021-11-17: qty 20

## 2021-11-17 MED ORDER — ALBUTEROL SULFATE (2.5 MG/3ML) 0.083% IN NEBU
INHALATION_SOLUTION | RESPIRATORY_TRACT | Status: AC
  Filled 2021-11-17: qty 3

## 2021-11-17 MED ORDER — BUPIVACAINE LIPOSOME 1.3 % IJ SUSP
INTRAMUSCULAR | Status: DC | PRN
  Administered 2021-11-17: 50 mL

## 2021-11-17 MED ORDER — ORAL CARE MOUTH RINSE: 15.0000 mL | Freq: Once | OROMUCOSAL | Status: AC

## 2021-11-17 MED ORDER — MIDAZOLAM HCL 5 MG/5ML IJ SOLN
INTRAMUSCULAR | Status: DC | PRN
Start: 2021-11-17 — End: 2021-11-17
  Administered 2021-11-17: 1 mg via INTRAVENOUS

## 2021-11-17 SURGICAL SUPPLY — 55 items
DEFOGGER SCOPE WARMER CLEARIFY (MISCELLANEOUS) ×2 IMPLANT
DERMABOND ADVANCED (GAUZE/BANDAGES/DRESSINGS) ×1
DERMABOND ADVANCED .7 DNX12 (GAUZE/BANDAGES/DRESSINGS) ×1 IMPLANT
DRAIN CHANNEL 19F RND (DRAIN) ×1 IMPLANT
DRAIN CONNECTOR BLAKE 1:1 (MISCELLANEOUS) ×1 IMPLANT
DRAPE ARM DVNC X/XI (DISPOSABLE) ×4 IMPLANT
DRAPE COLUMN DVNC XI (DISPOSABLE) ×1 IMPLANT
DRAPE CV SPLIT W-CLR ANES SCRN (DRAPES) ×2 IMPLANT
DRAPE DA VINCI XI ARM (DISPOSABLE) ×4
DRAPE DA VINCI XI COLUMN (DISPOSABLE) ×1
DRAPE ORTHO SPLIT 77X108 STRL (DRAPES) ×1
DRAPE SURG ORHT 6 SPLT 77X108 (DRAPES) ×1 IMPLANT
ELECT REM PT RETURN 9FT ADLT (ELECTROSURGICAL)
ELECTRODE REM PT RTRN 9FT ADLT (ELECTROSURGICAL) IMPLANT
EVACUATOR SILICONE 100CC (DRAIN) IMPLANT
GAUZE SPONGE 4X4 12PLY STRL (GAUZE/BANDAGES/DRESSINGS) IMPLANT
GAUZE SPONGE 4X4 12PLY STRL LF (GAUZE/BANDAGES/DRESSINGS) ×1 IMPLANT
GLOVE SURG ENC MOIS LTX SZ7 (GLOVE) ×4 IMPLANT
GOWN STRL REUS W/ TWL LRG LVL3 (GOWN DISPOSABLE) ×1 IMPLANT
GOWN STRL REUS W/ TWL XL LVL3 (GOWN DISPOSABLE) ×1 IMPLANT
GOWN STRL REUS W/TWL LRG LVL3 (GOWN DISPOSABLE) ×1
GOWN STRL REUS W/TWL XL LVL3 (GOWN DISPOSABLE) ×1
HEMOSTAT POWDER SURGIFOAM 1G (HEMOSTASIS) IMPLANT
HEMOSTAT SURGICEL 2X14 (HEMOSTASIS) ×1 IMPLANT
KIT SUCTION CATH 14FR (SUCTIONS) IMPLANT
NDL HYPO 25GX1X1/2 BEV (NEEDLE) IMPLANT
NEEDLE HYPO 25GX1X1/2 BEV (NEEDLE) IMPLANT
PACK CHEST (CUSTOM PROCEDURE TRAY) IMPLANT
PAD ARMBOARD 7.5X6 YLW CONV (MISCELLANEOUS) ×4 IMPLANT
PAD ELECT DEFIB RADIOL ZOLL (MISCELLANEOUS) ×2 IMPLANT
SEAL CANN UNIV 5-8 DVNC XI (MISCELLANEOUS) ×3 IMPLANT
SEAL XI 5MM-8MM UNIVERSAL (MISCELLANEOUS) ×3
SET TRI-LUMEN FLTR TB AIRSEAL (TUBING) ×2 IMPLANT
STOPCOCK 4 WAY LG BORE MALE ST (IV SETS) IMPLANT
SUT BONE WAX W31G (SUTURE) IMPLANT
SUT MNCRL AB 3-0 PS2 18 (SUTURE) IMPLANT
SUT PDS AB 1 CTX 36 (SUTURE) IMPLANT
SUT SILK  1 MH (SUTURE) ×1
SUT SILK 1 MH (SUTURE) ×1 IMPLANT
SUT SILK 2 0 SH CR/8 (SUTURE) IMPLANT
SUT STEEL 6MS V (SUTURE) IMPLANT
SUT STEEL SZ 6 DBL 3X14 BALL (SUTURE) IMPLANT
SUT VIC AB 2-0 CTX 36 (SUTURE) IMPLANT
SUT VIC AB 3-0 SH 27 (SUTURE) ×1
SUT VIC AB 3-0 SH 27X BRD (SUTURE) ×1 IMPLANT
SUT VICRYL 0 TIES 12 18 (SUTURE) ×1 IMPLANT
SUT VICRYL 0 UR6 27IN ABS (SUTURE) ×3 IMPLANT
SYR 50ML LL SCALE MARK (SYRINGE) IMPLANT
SYSTEM RETRIEVAL ANCHOR 8 (MISCELLANEOUS) IMPLANT
SYSTEM SAHARA CHEST DRAIN ATS (WOUND CARE) IMPLANT
TAPE CLOTH SURG 4X10 WHT LF (GAUZE/BANDAGES/DRESSINGS) ×1 IMPLANT
TOWEL GREEN STERILE (TOWEL DISPOSABLE) IMPLANT
TRAP SPECIMEN MUCUS 40CC (MISCELLANEOUS) ×3 IMPLANT
TRAY FOLEY SLVR 16FR TEMP STAT (SET/KITS/TRAYS/PACK) IMPLANT
TUBING EXTENTION W/L.L. (IV SETS) IMPLANT

## 2021-11-17 NOTE — Interval H&P Note (Signed)
History and Physical Interval Note:  11/17/2021 1:45 PM  Tammy Small  has presented today for surgery, with the diagnosis of pericardial effusion.  The various methods of treatment have been discussed with the patient and family. After consideration of risks, benefits and other options for treatment, the patient has consented to  Procedure(s): RIGHT XI ROBOTIC ASSISTED THORACOSCOPY PERICARDIAL WINDOW (Right) as a surgical intervention.  The patient's history has been reviewed, patient examined, no change in status, stable for surgery.  I have reviewed the patient's chart and labs.  Questions were answered to the patient's satisfaction.     [name] Bary Leriche

## 2021-11-17 NOTE — Brief Op Note (Addendum)
11/17/2021  3:58 PM  PATIENT:  Tammy Small  66 y.o. female  PRE-OPERATIVE DIAGNOSIS:  Pericardial effusion  POST-OPERATIVE DIAGNOSIS:  Pericardial effusion  PROCEDURE:   XI ROBOTIC ASSISTED RIGHT THORACOSCOPY, PERICARDIAL WINDOW   SURGEON:  Surgeon(s) and Role:    Lightfoot, Lucile Crater, MD - Primary  PHYSICIAN ASSISTANT: 1. Jadene Pierini PA-C 2.Charnita Trudel PA-C  ANESTHESIA:   general  EBL:  Minimal  BLOOD ADMINISTERED:none  DRAINS:  51 Blake drain placed in the right pleural space    LOCAL MEDICATIONS USED:  OTHER Exparel  SPECIMEN:  Source of Specimen:  Pericardial fluid and pericardial biopsy  DISPOSITION OF SPECIMEN:  PATHOLOGY  COUNTS CORRECT:  YES  DICTATION: .Dragon Dictation  PLAN OF CARE: Admit to inpatient   PATIENT DISPOSITION:  PACU - hemodynamically stable.   Delay start of Pharmacological VTE agent (>24hrs) due to surgical blood loss or risk of bleeding: yes

## 2021-11-17 NOTE — Hospital Course (Addendum)
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  She states that she has been short of breath since July of this year.  She underwent cross-sectional imaging as well as an echocardiogram which showed a posterior pericardial effusion.  She has been evaluated by rheumatology and there is concern that this may be reactive given her positive ANA.  She has been on colchicine as well as Lasix without much improvement in the effusion or symptoms.   She does have a history of atrial fibrillation and is on Eliquis. Dr. Kipp Brood discussed the need for robotic assisted right VATS, pericardial window. Potential risks, benefits, and complications of the surgery were discussed with the patient and she agreed to proceed with surgery.  Hospital Course: Patient underwent a Xi robotic assisted right thoracoscopy, pericardial window on 11/17/2021. She was extubated and transferred from the OR to PACU in stable condition.  The patient's chest tube had increased output.  This improved with removal of her drain on 11/23/2021.  The patient had issues with pain control and her medications were adjusted accordingly.  Unfortunately she developed an elevation in her creatinine level felt to be due to Toradol usage.  Thus this medication was discontinued.  The patient was restarted on her home regimen of Eliquis.  The patient developed a complaint of left leg pain and venous Duplex was ordered showing no evidence of DVT.  She continued to have moderate drainage from the pericardial tube for several days after surgery.  On the third postoperative day, she developed nausea with soda vomiting without abdominal pain.  The patient felt this was related to the narcotic analgesics and asked to have these discontinued.  Once she was transitioned to primarily nonnarcotic pain regimen, her nausea resolved.  Appetite remained poor.  The patient had not have bowel movement in several days.  It  was felt this could be attributing to her nausea.  She was treated with IV reglan and lactulose.  Patients nausea improved after having a bowel movement.  She had difficulty with hypoglycemia due to poor oral intake.  She was encouraged to increase oral intake now that nausea has resolved.  The patients surgical incisions are healing without evidence of infection.  The patient was felt stable for discharge.

## 2021-11-17 NOTE — Anesthesia Procedure Notes (Signed)
Arterial Line Insertion Start/End1/09/2022 1:00 PM Performed by: Drema Pry, CRNA, CRNA  Preanesthetic checklist: patient identified, IV checked, risks and benefits discussed, monitors and equipment checked and pre-op evaluation Lidocaine 1% used for infiltration Left, radial was placed Catheter size: 20 G Hand hygiene performed  and maximum sterile barriers used   Attempts: 1 Procedure performed without using ultrasound guided technique. Following insertion, dressing applied and Biopatch. Post procedure assessment: normal  Patient tolerated the procedure well with no immediate complications.

## 2021-11-17 NOTE — Op Note (Signed)
° °   °  [address]       [place], [id number]             [phone]        11/17/2021  Patient:  Zachery Dakins Pre-Op Dx: pericardial effusion    Post-op Dx:  same Procedure: - Robotic assisted right video thoracoscopy - Pericardial window - Intercostal nerve block  Surgeon and Role:      * Maurice Fotheringham, Lucile Crater, MD - Primary    Josie Saunders, PA-C - assisting  Anesthesia  general EBL:  46ml Blood Administration: none Specimen:  pericardial fluid   pericardium  Drains: 66 F chest tube in right chest Counts: correct   Indications: 66 year old female with multiple medical problems including atrial fibrillation currently on Eliquis, and a positive ANA presents with a chronic pericardial effusion.  There does not appear to be a good window for pericardiocentesis.  We discussed the risks and benefits of a right robotic assisted thoracoscopy with pericardial window.  She is agreeable to proceed.  She is tentatively scheduled for November 17, 2021. Findings: Serous pericardial fluid.  Thin dilated pericardial sac  Operative Technique: After the risks, benefits and alternatives were thoroughly discussed, the patient was brought to the operative theatre.  Anesthesia was induced, and the patient was then placed in a left lazy lateral decubitus position and was prepped and draped in normal sterile fashion.  An appropriate surgical pause was performed, and pre-operative antibiotics were dosed accordingly.  We began by placing our 3 robotic ports in the the intercostal spaces targeting the pericardium.  A 5mm assistant port was placed in the 9th intercostal space in the posterior axillary line.  The robot was then docked and all instruments were passed under direct visualization.    The pericardium was visualized, and a 3X3 centimeters pericardial window was created using Bovie cautery.  The pericardium was thin.  Immediate release of serous pericardial fluid was released.   The fluid and pericardium were sent for specimen.  I scored another hole in the pericardium inferior to the phrenic nerve.  A 19 [place] Blake drain was passed through right inferior robotic port into the pericardium.  An intercostal nerve block was performed under direct visualization.  The skin and soft tissue were closed with absorbable suture    The patient tolerated the procedure without any immediate complications, and was transferred to the PACU in stable condition.  Eulis Salazar Bary Leriche

## 2021-11-17 NOTE — Anesthesia Procedure Notes (Signed)
Procedure Name: Intubation Date/Time: 11/17/2021 2:59 PM Performed by: Glynda Jaeger, CRNA Pre-anesthesia Checklist: Patient identified, Patient being monitored, Timeout performed, Emergency Drugs available and Suction available Patient Re-evaluated:Patient Re-evaluated prior to induction Oxygen Delivery Method: Circle System Utilized Preoxygenation: Pre-oxygenation with 100% oxygen Induction Type: IV induction Ventilation: Mask ventilation without difficulty Laryngoscope Size: Mac and 4 Grade View: Grade I Tube type: Oral Endobronchial tube: Double lumen EBT and 37 Fr Number of attempts: 1 Airway Equipment and Method: Stylet Placement Confirmation: ETT inserted through vocal cords under direct vision, positive ETCO2 and breath sounds checked- equal and bilateral Secured at: 21 cm Tube secured with: Tape Dental Injury: Teeth and Oropharynx as per pre-operative assessment

## 2021-11-17 NOTE — Transfer of Care (Signed)
Immediate Anesthesia Transfer of Care Note  Patient: Tammy Small  Procedure(s) Performed: RIGHT XI ROBOTIC ASSISTED THORACOSCOPY PERICARDIAL WINDOW (Right)  Patient Location: PACU  Anesthesia Type:General  Level of Consciousness: awake, alert  and responds to stimulation  Airway & Oxygen Therapy: Patient Spontanous Breathing and Patient connected to face mask oxygen  Post-op Assessment: Report given to RN and Post -op Vital signs reviewed and stable  Post vital signs: Reviewed and stable  Last Vitals:  Vitals Value Taken Time  BP 150/99 11/17/21 1648  Temp    Pulse 65 11/17/21 1653  Resp 19 11/17/21 1653  SpO2 97 % 11/17/21 1653  Vitals shown include unvalidated device data.  Last Pain:  Vitals:   11/17/21 1157  TempSrc:   PainSc: 5       Patients Stated Pain Goal: 3 (11/17/21 1157)  Complications: No notable events documented.

## 2021-11-18 ENCOUNTER — Inpatient Hospital Stay (HOSPITAL_COMMUNITY): Payer: Self-pay

## 2021-11-18 ENCOUNTER — Encounter (HOSPITAL_COMMUNITY): Payer: Self-pay | Admitting: Thoracic Surgery (Cardiothoracic Vascular Surgery)

## 2021-11-18 DIAGNOSIS — Z9889 Other specified postprocedural states: Secondary | ICD-10-CM

## 2021-11-18 LAB — POCT I-STAT 7, (LYTES, BLD GAS, ICA,H+H)
Acid-Base Excess: 1 mmol/L (ref 0.0–2.0)
Acid-base deficit: 2 mmol/L (ref 0.0–2.0)
Bicarbonate: 22.7 mmol/L (ref 20.0–28.0)
Bicarbonate: 26.4 mmol/L (ref 20.0–28.0)
Calcium, Ion: 1.13 mmol/L — ABNORMAL LOW (ref 1.15–1.40)
Calcium, Ion: 1.13 mmol/L — ABNORMAL LOW (ref 1.15–1.40)
HCT: 39 % (ref 36.0–46.0)
HCT: 43 % (ref 36.0–46.0)
Hemoglobin: 13.3 g/dL (ref 12.0–15.0)
Hemoglobin: 14.6 g/dL (ref 12.0–15.0)
O2 Saturation: 99 %
O2 Saturation: 98 %
Potassium: 3.2 mmol/L — ABNORMAL LOW (ref 3.5–5.1)
Potassium: 6.8 mmol/L (ref 3.5–5.1)
Sodium: 143 mmol/L (ref 135–145)
Sodium: 137 mmol/L (ref 135–145)
TCO2: 24 mmol/L (ref 22–32)
TCO2: 28 mmol/L (ref 22–32)
pCO2 arterial: 36.9 mmHg (ref 32.0–48.0)
pCO2 arterial: 41.9 mmHg (ref 32.0–48.0)
pH, Arterial: 7.398 (ref 7.350–7.450)
pH, Arterial: 7.408 (ref 7.350–7.450)
pO2, Arterial: 115 mmHg — ABNORMAL HIGH (ref 83.0–108.0)
pO2, Arterial: 109 mmHg — ABNORMAL HIGH (ref 83.0–108.0)

## 2021-11-18 LAB — CBC
HCT: 46.4 % — ABNORMAL HIGH (ref 36.0–46.0)
Hemoglobin: 14.6 g/dL (ref 12.0–15.0)
MCH: 29.7 pg (ref 26.0–34.0)
MCHC: 31.5 g/dL (ref 30.0–36.0)
MCV: 94.5 fL (ref 80.0–100.0)
Platelets: 185 10*3/uL (ref 150–400)
RBC: 4.91 MIL/uL (ref 3.87–5.11)
RDW: 15.9 % — ABNORMAL HIGH (ref 11.5–15.5)
WBC: 8.7 10*3/uL (ref 4.0–10.5)
nRBC: 0 % (ref 0.0–0.2)

## 2021-11-18 LAB — GLUCOSE, CAPILLARY
Glucose-Capillary: 100 mg/dL — ABNORMAL HIGH (ref 70–99)
Glucose-Capillary: 176 mg/dL — ABNORMAL HIGH (ref 70–99)

## 2021-11-18 LAB — BASIC METABOLIC PANEL
Anion gap: 9 (ref 5–15)
BUN: 16 mg/dL (ref 8–23)
CO2: 23 mmol/L (ref 22–32)
Calcium: 8.4 mg/dL — ABNORMAL LOW (ref 8.9–10.3)
Chloride: 105 mmol/L (ref 98–111)
Creatinine, Ser: 1.03 mg/dL — ABNORMAL HIGH (ref 0.44–1.00)
GFR, Estimated: >60 mL/min (ref >60)
Glucose, Bld: 154 mg/dL — ABNORMAL HIGH (ref 70–99)
Potassium: 4.1 mmol/L (ref 3.5–5.1)
Sodium: 137 mmol/L (ref 135–145)

## 2021-11-18 MED ORDER — KETOROLAC TROMETHAMINE 15 MG/ML IJ SOLN
30.0000 mg | Freq: Four times a day (QID) | INTRAMUSCULAR | Status: DC
  Administered 2021-11-18 – 2021-11-19 (×4): 30 mg via INTRAVENOUS
  Filled 2021-11-18 (×4): qty 2

## 2021-11-18 MED ORDER — APIXABAN 5 MG PO TABS
5.0000 mg | ORAL_TABLET | Freq: Two times a day (BID) | ORAL | Status: DC
  Administered 2021-11-18 – 2021-11-23 (×10): 5 mg via ORAL
  Filled 2021-11-18 (×11): qty 1

## 2021-11-18 MED ORDER — KETOROLAC TROMETHAMINE 15 MG/ML IJ SOLN
15.0000 mg | Freq: Once | INTRAMUSCULAR | Status: AC
  Administered 2021-11-18: 15 mg via INTRAVENOUS
  Filled 2021-11-18: qty 1

## 2021-11-18 NOTE — Anesthesia Postprocedure Evaluation (Signed)
Anesthesia Post Note  Patient: Tammy Small  Procedure(s) Performed: RIGHT XI ROBOTIC ASSISTED THORACOSCOPY PERICARDIAL WINDOW (Right)     Patient location during evaluation: PACU Anesthesia Type: General Level of consciousness: sedated and patient cooperative Pain management: pain level controlled Vital Signs Assessment: post-procedure vital signs reviewed and stable Respiratory status: spontaneous breathing Cardiovascular status: stable Anesthetic complications: no   No notable events documented.  Last Vitals:  Vitals:   11/18/21 0314 11/18/21 0736  BP: 133/85 117/72  Pulse: 71 77  Resp: 18 12  Temp: 36.6 C 36.7 C  SpO2: 98% 100%    Last Pain:  Vitals:   11/18/21 0736  TempSrc: Oral  PainSc:                  [name]

## 2021-11-18 NOTE — Progress Notes (Addendum)
° °   °  [address]       [place] [address]             [phone]      1 Day Post-Op Procedure(s) (LRB): RIGHT XI ROBOTIC ASSISTED THORACOSCOPY PERICARDIAL WINDOW (Right)  Subjective:  Patient complains of pain this morning.  She is getting some relief with pain medication.  Objective: Vital signs in last 24 hours: Temp:  [97.2 F (36.2 C)-98.1 F (36.7 C)] 98 F (36.7 C) (01/12 0736) Pulse Rate:  [61-81] 77 (01/12 0736) Cardiac Rhythm: Atrial fibrillation (01/11 1900) Resp:  [10-30] 12 (01/12 0736) BP: (117-160)/(72-101) 117/72 (01/12 0736) SpO2:  [91 %-100 %] 100 % (01/12 0736) Arterial Line BP: (109-168)/(79-96) 148/79 (01/12 0736) Weight:  [96.2 kg] 96.2 kg (01/11 1122)  Intake/Output from previous day: 01/11 0701 - 01/12 0700 In: 820 [P.O.:120; I.V.:500; IV Piggyback:200] Out: 760 [Urine:275; Blood:5; Chest Tube:480]  General appearance: alert, cooperative, and no distress Heart: regular rate and rhythm Lungs: clear to auscultation bilaterally Abdomen: soft, non-tender; bowel sounds normal; no masses,  no organomegaly Extremities: extremities normal, atraumatic, no cyanosis or edema Wound: clean and dry  Lab Results: Recent Labs    11/15/21 1201 11/18/21 0210  WBC 5.7 8.7  HGB 16.6* 14.6  HCT 51.4* 46.4*  PLT 224 185   BMET:  Recent Labs    11/15/21 1201 11/18/21 0210  NA 137 137  K 4.1 4.1  CL 105 105  CO2 26 23  GLUCOSE 94 154*  BUN 20 16  CREATININE 1.22* 1.03*  CALCIUM 8.9 8.4*    PT/INR:  Recent Labs    11/15/21 1201  LABPROT 15.4*  INR 1.2   ABG    Component Value Date/Time   PHART 7.406 11/15/2021 1224   HCO3 22.0 11/15/2021 1224   ACIDBASEDEF 2.0 11/15/2021 1224   O2SAT 89.4 11/15/2021 1224   CBG (last 3)  Recent Labs    11/17/21 2100 11/18/21 0007 11/18/21 0632  GLUCAP 108* 176* 100*    Assessment/Plan: S/P Procedure(s) (LRB): RIGHT XI ROBOTIC ASSISTED THORACOSCOPY PERICARDIAL WINDOW  (Right)  CV-NSR, + HTN- some component of mediated- Lopressor.. will restart Eliquis tomorrow Pulm- CT output 480 cc. CXR is w/o pneumothorax.. leave chest tube in place Renal- creatinine stable at 1.03, repeat in AM to ensure no further increase in creatinine Lovenox for DVT prophylaxis  Dispo- patient stable, will leave chest tube in place today, monitor creatinine closely, restart Eliquis tomorrow   LOS: 1 day    Ellwood Handler, PA-C 11/18/2021  Agree with above Continue CT drainage Puml hygiene  Kelli Robeck O Zyairah Wacha

## 2021-11-18 NOTE — Discharge Summary (Signed)
Physician Discharge Summary  Patient ID: Tammy Small MRN: [id number] DOB/AGE: 12/04/1955 66 y.o.  Admit date: 11/17/2021 Discharge date: 11/23/2021  Admission Diagnoses:  Patient Active Problem List   Diagnosis Date Noted   Positive ANA (antinuclear antibody) 10/07/2021   Chronic respiratory failure with hypoxia (HCC) 08/25/2021   Pericardial effusion 08/25/2021   Acute exacerbation of CHF (congestive heart failure) ([facility]) 05/22/2021   Dyspnea and respiratory abnormalities 05/21/2021   Paroxysmal A-fib ([facility]) 05/21/2021   HTN (hypertension) 05/21/2021   Obesity (BMI 30-39.9) 05/21/2021   Discharge Diagnoses:   Patient Active Problem List   Diagnosis Date Noted   S/P Xi Robotic assisted Video Assisted Left Pericardial Window Creation 11/18/2021   Positive ANA (antinuclear antibody) 10/07/2021   Chronic respiratory failure with hypoxia (HCC) 08/25/2021   Pericardial effusion 08/25/2021   Acute exacerbation of CHF (congestive heart failure) ([facility]) 05/22/2021   Dyspnea and respiratory abnormalities 05/21/2021   Paroxysmal A-fib ([facility]) 05/21/2021   HTN (hypertension) 05/21/2021   Obesity (BMI 30-39.9) 05/21/2021   Discharged Condition: good  HPI: This is a 66 year old female presents for 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  She states that she has been short of breath since July of this year.  She underwent cross-sectional imaging as well as an echocardiogram which showed a posterior pericardial effusion.  She has been evaluated by rheumatology and there is concern that this may be reactive given her positive ANA.  She has been on colchicine as well as Lasix without much improvement in the effusion or symptoms.   She does have a history of atrial fibrillation and is on Eliquis. Dr. Kipp Brood discussed the need for robotic assisted right VATS, pericardial window. Potential risks, benefits, and complications of the surgery were discussed with the patient and she agreed  to proceed with surgery.  Hospital Course: Patient underwent a Xi robotic assisted right thoracoscopy, pericardial window on 11/17/2021. She was extubated and transferred from the OR to PACU in stable condition.  The patient's chest tube had increased output.  This improved with removal of her drain on 11/23/2021.  The patient had issues with pain control and her medications were adjusted accordingly.  Unfortunately she developed an elevation in her creatinine level felt to be due to Toradol usage.  Thus this medication was discontinued.  The patient was restarted on her home regimen of Eliquis.  The patient developed a complaint of left leg pain and venous Duplex was ordered showing no evidence of DVT.  She continued to have moderate drainage from the pericardial tube for several days after surgery.  On the third postoperative day, she developed nausea with soda vomiting without abdominal pain.  The patient felt this was related to the narcotic analgesics and asked to have these discontinued.  Once she was transitioned to primarily nonnarcotic pain regimen, her nausea resolved.  Appetite remained poor.  The patient had not have bowel movement in several days.  It was felt this could be attributing to her nausea.  She was treated with IV reglan and lactulose.  Patients nausea improved after having a bowel movement.  She had difficulty with hypoglycemia due to poor oral intake.  She was encouraged to increase oral intake now that nausea has resolved.  The patients surgical incisions are healing without evidence of infection.  The patient was felt stable for discharge.  Consults: None  Significant Diagnostic Studies: cardiac graphics:    Echocardiogram:    IMPRESSIONS    1. There is a large pericardial effusion  present that is mainly located  posteriorly to the left ventricle. The effusion measures 2.5cm at  end-diastole in largest diameter. While the IVC is enlarged and does not  collapse 50% with  inspiration, the RV is  well expanded with no diastolic collapse visualized. No tamponade.   2. Left ventricular ejection fraction, by estimation, is 70 to 75%. The  left ventricle has hyperdynamic function. There is moderate concentric  left ventricular hypertrophy. Diastolic function indeterminant due to  Afib.   3. Left atrial size was moderately dilated.   4. Right atrial size was severely dilated.   5. Right ventricular systolic function is mildly reduced. The right  ventricular size is mildly enlarged.   Comparison(s): Compared to prior TTE in 05/2021, the effusion appears  slightly larger in size but there is no evidence of tamponade.   Treatments: surgery:   11/17/2021   Patient:  Tammy Small Pre-Op Dx: pericardial effusion            Post-op Dx:  same Procedure: - Robotic assisted right video thoracoscopy - Pericardial window - Intercostal nerve block   Surgeon and Role:      * Lightfoot, Lucile Crater, MD - Primary    Josie Saunders, PA-C - assisting  PATHOLOGY:  SURGICAL PATHOLOGY  CASE: [phone]  PATIENT: Tammy Small  Surgical Pathology Report   Clinical History: pericardial effusion (cm)   FINAL MICROSCOPIC DIAGNOSIS:   A. PERICARDIUM, BIOPSY:  -  Mild chronic inflammation and focal fibrosis.  -  No malignancy or granulomas identified.   Discharge Exam: Blood pressure 125/76, pulse 85, temperature 98.5 F (36.9 C), temperature source Oral, resp. rate 19, height 5\' 2"  (1.575 m), weight 96.2 kg, SpO2 91 %.  General appearance: alert, cooperative, and no distress Heart: regular rate and rhythm Lungs: clear to auscultation bilaterally Abdomen: soft, non-tender; bowel sounds normal; no masses,  no organomegaly Extremities: extremities normal, atraumatic, no cyanosis or edema Wound: clean and dry     Allergies as of 11/23/2021   No Known Allergies      Medication List     STOP taking these medications    colchicine 0.6 MG tablet        TAKE these medications    apixaban 5 MG Tabs tablet Commonly known as: ELIQUIS Take 1 tablet (5 mg total) by mouth 2 (two) times daily.   atorvastatin 20 MG tablet Commonly known as: LIPITOR TAKE 1 Tablet BY MOUTH ONCE EVERY DAY   B-12 PO Take 1 tablet by mouth daily.   furosemide 40 MG tablet Commonly known as: LASIX Take 1 tablet (40 mg total) by mouth daily. What changed: when to take this   metoprolol tartrate 25 MG tablet Commonly known as: LOPRESSOR Take 1 tablet (25 mg total) by mouth 2 (two) times daily.   OMEGA 3 PO Take 1 capsule by mouth daily.   ondansetron 4 MG tablet Commonly known as: Zofran Take 1 tablet (4 mg total) by mouth every 8 (eight) hours as needed for nausea or vomiting.   potassium chloride SA 20 MEQ tablet Commonly known as: KLOR-CON M Take 1 tablet (20 mEq total) by mouth daily. What changed: how much to take   traMADol 50 MG tablet Commonly known as: ULTRAM Take 1 tablet (50 mg total) by mouth every 6 (six) hours as needed (mild pain).   VITAMIN D3 PO Take 1 tablet by mouth daily.        Follow-up Information     Lightfoot,  Lucile Crater, MD Follow up on 11/26/2021.   Specialty: Cardiothoracic Surgery Why: Appointment is at 11:10 Contact information: [address] [place] [address] [phone]                 Signed: Ellwood Handler, PA-C  11/23/2021, 7:39 AM

## 2021-11-19 ENCOUNTER — Inpatient Hospital Stay (HOSPITAL_COMMUNITY): Payer: Self-pay

## 2021-11-19 DIAGNOSIS — M7989 Other specified soft tissue disorders: Secondary | ICD-10-CM

## 2021-11-19 LAB — CBC
HCT: 46 % (ref 36.0–46.0)
Hemoglobin: 14.9 g/dL (ref 12.0–15.0)
MCH: 30.8 pg (ref 26.0–34.0)
MCHC: 32.4 g/dL (ref 30.0–36.0)
MCV: 95 fL (ref 80.0–100.0)
Platelets: 174 10*3/uL (ref 150–400)
RBC: 4.84 MIL/uL (ref 3.87–5.11)
RDW: 15.9 % — ABNORMAL HIGH (ref 11.5–15.5)
WBC: 8.3 10*3/uL (ref 4.0–10.5)
nRBC: 0 % (ref 0.0–0.2)

## 2021-11-19 LAB — COMPREHENSIVE METABOLIC PANEL
ALT: 8 U/L (ref 0–44)
AST: 15 U/L (ref 15–41)
Albumin: 3.4 g/dL — ABNORMAL LOW (ref 3.5–5.0)
Alkaline Phosphatase: 67 U/L (ref 38–126)
Anion gap: 7 (ref 5–15)
BUN: 25 mg/dL — ABNORMAL HIGH (ref 8–23)
CO2: 28 mmol/L (ref 22–32)
Calcium: 9 mg/dL (ref 8.9–10.3)
Chloride: 99 mmol/L (ref 98–111)
Creatinine, Ser: 1.69 mg/dL — ABNORMAL HIGH (ref 0.44–1.00)
GFR, Estimated: 34 mL/min — ABNORMAL LOW (ref >60)
Glucose, Bld: 111 mg/dL — ABNORMAL HIGH (ref 70–99)
Potassium: 4.4 mmol/L (ref 3.5–5.1)
Sodium: 134 mmol/L — ABNORMAL LOW (ref 135–145)
Total Bilirubin: 2.1 mg/dL — ABNORMAL HIGH (ref 0.3–1.2)
Total Protein: 6 g/dL — ABNORMAL LOW (ref 6.5–8.1)

## 2021-11-19 LAB — ACID FAST SMEAR (AFB, MYCOBACTERIA): Acid Fast Smear: NEGATIVE

## 2021-11-19 LAB — SURGICAL PATHOLOGY

## 2021-11-19 MED ORDER — LIDOCAINE 5 % EX PTCH
1.0000 | MEDICATED_PATCH | CUTANEOUS | Status: DC
  Administered 2021-11-19 – 2021-11-21 (×2): 1 via TRANSDERMAL
  Filled 2021-11-19 (×4): qty 1

## 2021-11-19 NOTE — TOC Progression Note (Signed)
Transition of Care [facility]) - Progression Note    Patient Details  Name: Leneisha Kulaga MRN: [id number] Date of Birth: Jun 13, 1956  Transition of Care [facility]) CM/SW [place], RN Phone Number:[phone]  11/19/2021, 1:08 PM  Clinical Narrative:     Transition of Care [facility]) Screening Note   Patient Details  Name: Dashara Solarz Date of Birth: January 13, 1956   Transition of Care [facility]) CM/SW Contact:    Angelita Ingles, RN Phone Number: 11/19/2021, 1:09 PM    Transition of Care Department [facility]) has reviewed patient and no TOC needs have been identified at this time. We will continue to monitor patient advancement through interdisciplinary progression rounds. If new patient transition needs arise, please place a TOC consult.          Expected Discharge Plan and Services                                                 Social Determinants of Health (SDOH) Interventions    Readmission Risk Interventions No flowsheet data found.

## 2021-11-19 NOTE — Progress Notes (Signed)
LLE venous duplex has been completed.  Results can be found under chart review under CV PROC. 11/19/2021 10:25 AM Jacob Cicero RVT, RDMS

## 2021-11-19 NOTE — Plan of Care (Signed)
  Problem: Education: Goal: Knowledge of General Education information will improve Description: Including pain rating scale, medication(s)/side effects and non-pharmacologic comfort measures Outcome: Progressing   Problem: Health Behavior/Discharge Planning: Goal: Ability to manage health-related needs will improve Outcome: Progressing   Problem: Clinical Measurements: Goal: Respiratory complications will improve Outcome: Progressing   Problem: Activity: Goal: Risk for activity intolerance will decrease Outcome: Progressing   Problem: Pain Managment: Goal: General experience of comfort will improve Outcome: Progressing   Problem: Skin Integrity: Goal: Risk for impaired skin integrity will decrease Outcome: Progressing   

## 2021-11-19 NOTE — Progress Notes (Addendum)
° °   °  [address]       [name] [address]             [phone]      2 Days Post-Op Procedure(s) (LRB): RIGHT XI ROBOTIC ASSISTED THORACOSCOPY PERICARDIAL WINDOW (Right)  Subjective:  Pain is better today.  She feels blessed.  + flatus  Objective: Vital signs in last 24 hours: Temp:  [97.6 F (36.4 C)-98.3 F (36.8 C)] 98.2 F (36.8 C) (01/13 0751) Pulse Rate:  [72-80] 77 (01/13 0751) Cardiac Rhythm: Atrial fibrillation (01/12 1900) Resp:  [9-17] 17 (01/13 0751) BP: (113-125)/(72-85) 118/77 (01/13 0751) SpO2:  [87 %-96 %] 90 % (01/13 0751)  Intake/Output from previous day: 01/12 0701 - 01/13 0700 In: 840 [P.O.:840] Out: 245 [Chest Tube:245]  General appearance: alert, cooperative, and no distress Heart: regular rate and rhythm Lungs: clear to auscultation bilaterally Abdomen: soft, non-tender; bowel sounds normal; no masses,  no organomegaly Extremities: extremities normal, atraumatic, no cyanosis or edema Wound: clean and dry  Lab Results: Recent Labs    11/18/21 0210 11/19/21 0132  WBC 8.7 8.3  HGB 14.6 14.9  HCT 46.4* 46.0  PLT 185 174   BMET:  Recent Labs    11/18/21 0210 11/19/21 0132  NA 137 134*  K 4.1 4.4  CL 105 99  CO2 23 28  GLUCOSE 154* 111*  BUN 16 25*  CREATININE 1.03* 1.69*  CALCIUM 8.4* 9.0    PT/INR: No results for input(s): LABPROT, INR in the last 72 hours. ABG    Component Value Date/Time   PHART 7.408 11/17/2021 1614   HCO3 26.4 11/17/2021 1614   TCO2 28 11/17/2021 1614   ACIDBASEDEF 2.0 11/17/2021 1528   O2SAT 98.0 11/17/2021 1614   CBG (last 3)  Recent Labs    11/17/21 2100 11/18/21 0007 11/18/21 0632  GLUCAP 108* 176* 100*    Assessment/Plan: S/P Procedure(s) (LRB): RIGHT XI ROBOTIC ASSISTED THORACOSCOPY PERICARDIAL WINDOW (Right)  CV- NSR, HTN improved- continue Lopressor, Eliquis restarted Pulm-  CT output 245, leave in place today Renal- creatinine bumped to 1.69, suspect related to  Toradol, will discontinue repeat BMET in AM DVT prophylaxis on Eliquis Dispo- patient stable, leave chest drain today, monitor creatinine, will add lidocaine patch for additional pain control, repeat BMET in AM   LOS: 2 days   Tammy Dandy, PA-C 11/19/2021   Agree with above Stopping toradol Complains of left leg pain.  Will order DVT US, but already on eliquis for afib Continue PT and ambulation Will remove CT once <  Tammy Small

## 2021-11-19 NOTE — Final Progress Note (Signed)
Chaplain Melvenia Beam met with patient at bed side regarding the completion of Advance Directive. Patient acknowledged that preparing A.D. was important to her and something that she would like to have completed. Chaplain explained that A.D. is only used when patient is unable to make medical decisions regarding medical care for herself providing instruction to the doctors and her agents of how patient wishes to be cared for.  Patient stated after careful thought that she plans to name her daughter Burnard Leigh [phone], and niece, Jorene Minors [phone] as her Medical Power of [place].   Chaplain explained the importance of having conversation with her daughter and niece prior to naming them as her agents.   Chaplain provided patient a blank copy and provided patient instruction for the completion of Parts A and B of the A.D. Chaplain instructed patient to have the nurse contact [facility] when she has completed Parts A and B, so that we can arrange for Notary and witnesses to assist with completion of Part C.   Patient was extremely appreciative of the consultation. Chaplain will follow up with patient in order to arrange for witnesses and Notary assistance. Chaplain Morene Crocker may be contacted at [phone].

## 2021-11-20 LAB — BASIC METABOLIC PANEL
Anion gap: 11 (ref 5–15)
BUN: 25 mg/dL — ABNORMAL HIGH (ref 8–23)
CO2: 25 mmol/L (ref 22–32)
Calcium: 9.2 mg/dL (ref 8.9–10.3)
Chloride: 98 mmol/L (ref 98–111)
Creatinine, Ser: 1.11 mg/dL — ABNORMAL HIGH (ref 0.44–1.00)
GFR, Estimated: 56 mL/min — ABNORMAL LOW (ref >60)
Glucose, Bld: 100 mg/dL — ABNORMAL HIGH (ref 70–99)
Potassium: 4 mmol/L (ref 3.5–5.1)
Sodium: 134 mmol/L — ABNORMAL LOW (ref 135–145)

## 2021-11-20 NOTE — Progress Notes (Addendum)
° °   °  [address]       [name] [address]             [phone]      2 Days Post-Op Procedure(s) (LRB): RIGHT XI ROBOTIC ASSISTED THORACOSCOPY PERICARDIAL WINDOW (Right)  Subjective:  C/O nausea this morning with an episode of vomiting. Denies abdominal pain.  She thinks the nausea is related to the morphine and is asking that she not  be given any more. Nurse reports she has also refused the Zofran this morning.   + flatus, no BM yet.  Pericardial drainage for past 24 hours.   Objective: Vital signs in last 24 hours: Temp:  [98.2 F (36.8 C)-98.7 F (37.1 C)] 98.2 F (36.8 C) (01/14 0722) Pulse Rate:  [67-88] 88 (01/14 0722) Cardiac Rhythm: Atrial fibrillation;Atrial flutter (01/14 0700) Resp:  [15-20] 19 (01/14 0722) BP: (119-147)/(80-91) 142/91 (01/14 0722) SpO2:  [92 %-98 %] 95 % (01/14 0722)  Intake/Output from previous day: 01/13 0701 - 01/14 0700 In: 440 [P.O.:440] Out: 180 [Chest Tube:180]  General appearance: alert, cooperative, and mild distress due to nausea Heart: Irregularly irregular rhythm.  Monitor showing atrial fibrillation with controlled ventricular rate Lungs: clear to auscultation bilaterally Abdomen: soft, non-tender; bowel sounds normal Wounds: dressings dry, the drainage tube is secure.   Lab Results: Recent Labs    11/18/21 0210 11/19/21 0132  WBC 8.7 8.3  HGB 14.6 14.9  HCT 46.4* 46.0  PLT 185 174    BMET:  Recent Labs    11/19/21 0132 11/20/21 0047  NA 134* 134*  K 4.4 4.0  CL 99 98  CO2 28 25  GLUCOSE 111* 100*  BUN 25* 25*  CREATININE 1.69* 1.11*  CALCIUM 9.0 9.2     PT/INR: No results for input(s): LABPROT, INR in the last 72 hours. ABG    Component Value Date/Time   PHART 7.408 11/17/2021 1614   HCO3 26.4 11/17/2021 1614   TCO2 28 11/17/2021 1614   ACIDBASEDEF 2.0 11/17/2021 1528   O2SAT 98.0 11/17/2021 1614   CBG (last 3)  Recent Labs    11/17/21 2100 11/18/21 0007  11/18/21 0632  GLUCAP 108* 176* 100*     Assessment/Plan: Postop day 3 S/P Procedure  RIGHT XI ROBOTIC ASSISTED THORACOSCOPY PERICARDIAL WINDOW (Right)   CV-chronic paroxysmal atrial fibrillation.  Back in A. fib with CVR.  Continue Lopressor, Eliquis restarted Pulm-  CT output 245 mL yesterday, 180 mL today.  l we will leave the drainage tube in place until less than 150 mL for 24 hours  Renal- creatinine bumped to 1.69 on postop day 2, suspect related to Toradol certain was discontinued.  Creatinine back down to 1.1  DVT prophylaxis --on Eliquis Dispo- patient stable, leave chest drain today, monitor creatinine.  Agree that morphine is probably causing her nausea.  She would like to use only Tylenol and tramadol.  Encouraged her to except the Zofran if nausea recurs.   LOS: 3 days   Parke Poisson [phone] 11/20/2021    Chart reviewed, patient examined, agree with above. Keep chest tube in since still significant drainage.

## 2021-11-21 LAB — GLUCOSE, CAPILLARY
Glucose-Capillary: 144 mg/dL — ABNORMAL HIGH (ref 70–99)
Glucose-Capillary: 83 mg/dL (ref 70–99)
Glucose-Capillary: 68 mg/dL — ABNORMAL LOW (ref 70–99)
Glucose-Capillary: 68 mg/dL — ABNORMAL LOW (ref 70–99)
Glucose-Capillary: 103 mg/dL — ABNORMAL HIGH (ref 70–99)

## 2021-11-21 MED ORDER — POLYETHYLENE GLYCOL 3350 17 G PO PACK
17.0000 g | PACK | Freq: Once | ORAL | Status: DC
  Filled 2021-11-21: qty 1

## 2021-11-21 MED ORDER — INSULIN ASPART 100 UNIT/ML IJ SOLN: 0.0000 [IU] | Freq: Three times a day (TID) | INTRAMUSCULAR | Status: DC

## 2021-11-21 NOTE — Progress Notes (Signed)
Hypoglycemic Event  CBG: 2225  Treatment: 8 oz juice/soda orange juice  Symptoms: None  Follow-up CBG: Time:2337 CBG Result:103  Possible Reasons for Event: Inadequate meal intake  Comments/MD notified: Pt encouraged to increase oral intake to prevent hypoglycemic events. Pt verbalized understanding of teaching with teach back.    Presley Raddle

## 2021-11-21 NOTE — Progress Notes (Signed)
Hypoglycemic Event  CBG: 68  Treatment: 8 oz orange juice  Symptoms: None  Follow-up CBG: Time:2225 CBG Result:68  Possible Reasons for Event: Inadequate meal intake  Comments/MD notified:Pt did not eat dinner tray and has poor oral intake.    Theron Arista

## 2021-11-21 NOTE — Progress Notes (Addendum)
° °   °  [address]       [place] [address]             [phone]      2 Days Post-Op Procedure(s) (LRB): RIGHT XI ROBOTIC ASSISTED THORACOSCOPY PERICARDIAL WINDOW (Right)  Subjective:  Feels better today, nausea resolved. Appetite still poor.   + flatus, no BM yet.  Pericardial drainage 24ml for past 24 hours.  PATH: Pericardial BX showing mild chronic inflammation, no malignancy.  Objective: Vital signs in last 24 hours: Temp:  [97.8 F (36.6 C)-98.6 F (37 C)] 97.8 F (36.6 C) (01/15 0723) Pulse Rate:  [66-86] 83 (01/15 0723) Cardiac Rhythm: Atrial flutter (01/15 0733) Resp:  [17-19] 17 (01/15 0723) BP: (127-147)/(75-99) 141/87 (01/15 0723) SpO2:  [92 %-98 %] 94 % (01/15 0723)  Intake/Output from previous day: 01/14 0701 - 01/15 0700 In: 240 [P.O.:240] Out: 510 [Urine:300; Chest Tube:210]  General appearance: alert, cooperative, and mild distress due to nausea Heart: Irregularly irregular rhythm.  Monitor showing atrial fibrillation with controlled ventricular rate (chronic) Lungs: clear to auscultation bilaterally Abdomen: soft, non-tender; bowel sounds normal Wounds: dressings dry, the drainage tube is secure.   Lab Results: Recent Labs    11/19/21 0132  WBC 8.3  HGB 14.9  HCT 46.0  PLT 174    BMET:  Recent Labs    11/19/21 0132 11/20/21 0047  NA 134* 134*  K 4.4 4.0  CL 99 98  CO2 28 25  GLUCOSE 111* 100*  BUN 25* 25*  CREATININE 1.69* 1.11*  CALCIUM 9.0 9.2     PT/INR: No results for input(s): LABPROT, INR in the last 72 hours. ABG    Component Value Date/Time   PHART 7.408 11/17/2021 1614   HCO3 26.4 11/17/2021 1614   TCO2 28 11/17/2021 1614   ACIDBASEDEF 2.0 11/17/2021 1528   O2SAT 98.0 11/17/2021 1614   CBG (last 3)  No results for input(s): GLUCAP in the last 72 hours.   Assessment/Plan: Postop day 3 S/P Procedure  RIGHT XI ROBOTIC ASSISTED THORACOSCOPY PERICARDIAL WINDOW (Right)   CV-chronic  paroxysmal atrial fibrillation.  Back in A. fib with CVR.  Continue Lopressor, Eliquis. Pulm-  CT output 210 mL yesterday.   Will leave the drainage tube in place until less than 150 mL for 24 hours  Renal- creatinine bumped to 1.69 on postop day 2, suspect related to Toradol so it was discontinued.  Creatinine back down to 1.1. Recheck BMP in AM.  DVT prophylaxis --on Eliquis Dispo- patient stable, leave chest drain today, monitor creatinine.  Needs more mobility.     LOS: 4 days   Malon Kindle [id number] 11/21/2021    Chart reviewed, patient examined, agree with above. Chest tube output still a little high to remove.

## 2021-11-21 NOTE — Progress Notes (Signed)
While ambulating in the hall pt states "I think something is leaking down my side" , pt points to fluid dripping onto the floor. Upon assessment RN notes pt is asymptomatic.RN assisted pt back into room to assess tube site.RN sees dressing is saturated with serous fluid and leaking down pts side as well fluid buildup in upper portion of the tube not draining to gravity.  PA Myron paged at at bedside  No new orders

## 2021-11-22 ENCOUNTER — Inpatient Hospital Stay (HOSPITAL_COMMUNITY): Payer: Self-pay

## 2021-11-22 LAB — GLUCOSE, CAPILLARY
Glucose-Capillary: 103 mg/dL — ABNORMAL HIGH (ref 70–99)
Glucose-Capillary: 104 mg/dL — ABNORMAL HIGH (ref 70–99)
Glucose-Capillary: 124 mg/dL — ABNORMAL HIGH (ref 70–99)
Glucose-Capillary: 165 mg/dL — ABNORMAL HIGH (ref 70–99)
Glucose-Capillary: 22 mg/dL — CL (ref 70–99)
Glucose-Capillary: 25 mg/dL — CL (ref 70–99)
Glucose-Capillary: 47 mg/dL — ABNORMAL LOW (ref 70–99)
Glucose-Capillary: 56 mg/dL — ABNORMAL LOW (ref 70–99)
Glucose-Capillary: 98 mg/dL (ref 70–99)

## 2021-11-22 LAB — BASIC METABOLIC PANEL
Anion gap: 5 (ref 5–15)
BUN: 18 mg/dL (ref 8–23)
CO2: 28 mmol/L (ref 22–32)
Calcium: 9 mg/dL (ref 8.9–10.3)
Chloride: 100 mmol/L (ref 98–111)
Creatinine, Ser: 0.96 mg/dL (ref 0.44–1.00)
GFR, Estimated: >60 mL/min (ref >60)
Glucose, Bld: 107 mg/dL — ABNORMAL HIGH (ref 70–99)
Potassium: 4.1 mmol/L (ref 3.5–5.1)
Sodium: 133 mmol/L — ABNORMAL LOW (ref 135–145)

## 2021-11-22 MED ORDER — METOCLOPRAMIDE HCL 5 MG/ML IJ SOLN
10.0000 mg | Freq: Four times a day (QID) | INTRAMUSCULAR | Status: AC
  Administered 2021-11-22 – 2021-11-23 (×4): 10 mg via INTRAVENOUS
  Filled 2021-11-22 (×4): qty 2

## 2021-11-22 MED ORDER — LACTULOSE 10 GM/15ML PO SOLN
20.0000 g | Freq: Once | ORAL | Status: AC
Start: 2021-11-22 — End: 2021-11-22
  Administered 2021-11-22: 20 g via ORAL
  Filled 2021-11-22: qty 30

## 2021-11-22 MED ORDER — FUROSEMIDE 10 MG/ML IJ SOLN
40.0000 mg | Freq: Once | INTRAMUSCULAR | Status: AC
  Administered 2021-11-22: 40 mg via INTRAVENOUS
  Filled 2021-11-22: qty 4

## 2021-11-22 NOTE — Plan of Care (Signed)
  Problem: Education: Goal: Knowledge of General Education information will improve Description: Including pain rating scale, medication(s)/side effects and non-pharmacologic comfort measures Outcome: Progressing   Problem: Health Behavior/Discharge Planning: Goal: Ability to manage health-related needs will improve Outcome: Progressing   Problem: Clinical Measurements: Goal: Respiratory complications will improve Outcome: Progressing   Problem: Activity: Goal: Risk for activity intolerance will decrease Outcome: Progressing   Problem: Nutrition: Goal: Adequate nutrition will be maintained Outcome: Progressing   Problem: Elimination: Goal: Will not experience complications related to bowel motility Outcome: Progressing   Problem: Pain Managment: Goal: General experience of comfort will improve Outcome: Progressing   

## 2021-11-22 NOTE — TOC Progression Note (Signed)
Transition of Care [facility]) - Progression Note    Patient Details  Name: Tammy Small MRN: [id number] Date of Birth: [birth year]  Transition of Care [facility]) CM/SW [place], RN Phone Number:[phone]  11/22/2021, 3:25 PM  Clinical Narrative:    TOC continue to follow. No TOC needs noted at this time.         Expected Discharge Plan and Services                                                 Social Determinants of Health (SDOH) Interventions    Readmission Risk Interventions No flowsheet data found.

## 2021-11-22 NOTE — Progress Notes (Signed)
Around 2130 RN called into patients room by NT to share that patient was sitting on the edge of the chair and wanted to straighten out her leg, she then proceed to slide off the foot rest of the chair to the floor. Patient stated to the NT who witnessed that she wanted to get to the [facility] just like she had been doing all day. Patient was cleaned up and returned to bed, after RN assessment stated she had no complaints of pain anywhere. Vital signs were taken, Dr. Cliffton Asters was called and given information. No new orders were obtained. Patient was then made a HIgh Fall Risk, has a yellow fall band on and bed alarm turned on.

## 2021-11-22 NOTE — Progress Notes (Addendum)
° °   °  [address]       [name] [address]             [phone]      5 Days Post-Op Procedure(s) (LRB): RIGHT XI ROBOTIC ASSISTED THORACOSCOPY PERICARDIAL WINDOW (Right)  Subjective:  Patient up in chair.  Nausea improved some.  She hasn't moved her bowel since prior to hospitalization.  She had hypoglycemia overnight.  Objective: Vital signs in last 24 hours: Temp:  [97.8 F (36.6 C)-98 F (36.7 C)] 97.8 F (36.6 C) (01/16 0507) Pulse Rate:  [75-87] 79 (01/16 0507) Cardiac Rhythm: Atrial flutter;Other (Comment) (01/15 1900) Resp:  [10-20] 13 (01/16 0507) BP: (118-141)/(85-96) 128/93 (01/16 0507) SpO2:  [90 %-95 %] 90 % (01/16 0507)  Intake/Output from previous day: 01/15 0701 - 01/16 0700 In: -  Out: 420 [Urine:300; Chest Tube:120]  General appearance: alert, cooperative, and no distress Heart: regular rate and rhythm Lungs: clear to auscultation bilaterally Abdomen: soft, mild distention, hypoactive BS Extremities: extremities normal, atraumatic, no cyanosis or edema Wound: clean and dry  Lab Results: No results for input(s): WBC, HGB, HCT, PLT in the last 72 hours. BMET:  Recent Labs    11/20/21 0047 11/22/21 0041  NA 134* 133*  K 4.0 4.1  CL 98 100  CO2 25 28  GLUCOSE 100* 107*  BUN 25* 18  CREATININE 1.11* 0.96  CALCIUM 9.2 9.0    PT/INR: No results for input(s): LABPROT, INR in the last 72 hours. ABG    Component Value Date/Time   PHART 7.408 11/17/2021 1614   HCO3 26.4 11/17/2021 1614   TCO2 28 11/17/2021 1614   ACIDBASEDEF 2.0 11/17/2021 1528   O2SAT 98.0 11/17/2021 1614   CBG (last 3)  Recent Labs    11/21/21 2225 11/21/21 2327 11/22/21 0618  GLUCAP 68* 103* 103*    Assessment/Plan: S/P Procedure(s) (LRB): RIGHT XI ROBOTIC ASSISTED THORACOSCOPY PERICARDIAL WINDOW (Right)  CV- NSR, BP stable- continue Lopressor, Eliquis Pulm- CT output 120, improving... will discuss possible removal of drain today vs  tomorrow Renal- creatinine is back to normal limits at 0.96, mild hyponatremia at 133 monitor GI- persistent nausea, patient hasn't moved bowels since prior to admission, this could be attributing to nausea... will give lactulose today, also add scheduled reglan... oral intake has been poor due to nausea CBGs- hypoglycemic overnight, sugars have been controlled, SSIP added yesterday, will discontinue Dispo- patient stable, hypoglycemic overnight will stop SSIP.. A1c is only 6.8, constipation could be attributing to nausea will try to give laxative today, add reglan x 4 doses for nausea, will discuss drain management with Dr. Cliffton Asters   LOS: 5 days    Lowella Dandy, PA-C 11/22/2021  Chest tube output remains high. Will diurese today.  Satina Jerrell Keane Scrape

## 2021-11-22 NOTE — Discharge Instructions (Signed)

## 2021-11-22 NOTE — Progress Notes (Signed)
Patient had 3 large bowel movements today after Lactulose ordered. She has had a very poor appetite for a few days. Patient was lethargic an tired throughout much of the day but especially this afternoon.   Crackles were heard in left lower base this a.m. Dr. Kipp Brood ordered a dose of Lasix. Patient did seem a bit more like herself after Lasix dose, and generalized edema did appear to improve.   Continue to monitor.

## 2021-11-23 LAB — AEROBIC/ANAEROBIC CULTURE W GRAM STAIN (SURGICAL/DEEP WOUND): Culture: NO GROWTH

## 2021-11-23 LAB — GLUCOSE, CAPILLARY: Glucose-Capillary: 103 mg/dL — ABNORMAL HIGH (ref 70–99)

## 2021-11-23 MED ORDER — TRAMADOL HCL 50 MG PO TABS: 50.0000 mg | ORAL_TABLET | Freq: Four times a day (QID) | ORAL | 0 refills | Status: DC | PRN

## 2021-11-23 MED ORDER — ONDANSETRON HCL 4 MG PO TABS: 4.0000 mg | ORAL_TABLET | Freq: Three times a day (TID) | ORAL | 0 refills | Status: DC | PRN

## 2021-11-23 MED ORDER — POTASSIUM CHLORIDE CRYS ER 20 MEQ PO TBCR: 20.0000 meq | EXTENDED_RELEASE_TABLET | Freq: Every day | ORAL | 3 refills | Status: AC

## 2021-11-23 MED ORDER — FUROSEMIDE 40 MG PO TABS: 40.0000 mg | ORAL_TABLET | Freq: Every day | ORAL | 3 refills | Status: AC

## 2021-11-23 NOTE — Progress Notes (Signed)
Chest tube pulled at 0920 this a.m. Patient instructed to take a deep breath and bear down, chest tube was pulled without complication. Vasoline impregnated dressing applied at site (per Dr. Cliffton Asters), covered in gauze and taped. No tie suture available on this chest tube. Patient tolerated procedure well.

## 2021-11-23 NOTE — TOC Transition Note (Addendum)
Transition of Care [facility]) - CM/SW Discharge Note   Patient Details  Name: Tammy Small MRN: [id number] Date of Birth: [birth year]  Transition of Care [facility]) CM/SW Contact:  Beckie Busing, RN Phone [phone]  11/23/2021, 11:52 AM   Clinical Narrative:    Home Oxygen has been ordered per [employer] through [facility] for uninsured patient. Patient and nurse have been updated and made aware that portable O2 will be delivered to the bedside. Info has been added to AVS. No other needs noted.   1230 CM received message from PT requesting 3in1 and rollator for patient. Both have been ordered through [employer] and will be delivered to the bedside.   1235 PT has recommendation for [facility] PT. CM has paged Lowella Dandy PA-C @ [phone] and sent secure chat to Dr, Irving Copas for orders. Response pending. CM will attempt to get charity HH once orders are received.   1249 No response from PA. Dr. Cliffton Asters has responded and given CM verbal for [facility] orders.HH charity referral has been called to Amy @ [facility]. Acceptance pending  1447 CM just received message from bedside nurse stating that DME(rollator and 3in1) has not been delivered. CM called [employer] spoke with [facility] who states that she will ask them to get the DME out soon. Nurse has been updated. CM has also followed up with Amy with [facility] to determine if the patient has been approved for charity home health. Approval is still pending.   1630 Patient has been accepted for charity [facility]  services. Iantha Fallen will reach out to patient.    Final next level of care: Home/Self Care Barriers to Discharge: No Barriers Identified   Patient Goals and CMS Choice        Discharge Placement                       Discharge Plan and Services In-house Referral: NA Discharge Planning Services: CM Consult Post Acute Care Choice: Durable Medical Equipment          DME Arranged: Oxygen DME Agency: [employer] Date DME Agency  Contacted: 11/23/21 Time DME Agency Contacted: 1134 Representative spoke with at DME Agency: Silvio Pate HH Arranged: NA HH Agency: NA        Social Determinants of Health (SDOH) Interventions     Readmission Risk Interventions No flowsheet data found.

## 2021-11-23 NOTE — Plan of Care (Signed)
°  Problem: Education: Goal: Knowledge of General Education information will improve Description: Including pain rating scale, medication(s)/side effects and non-pharmacologic comfort measures Outcome: Progressing   Problem: Health Behavior/Discharge Planning: Goal: Ability to manage health-related needs will improve Outcome: Progressing   Problem: Activity: Goal: Risk for activity intolerance will decrease Outcome: Progressing   Problem: Coping: Goal: Level of anxiety will decrease Outcome: Progressing   Problem: Pain Managment: Goal: General experience of comfort will improve Outcome: Not Applicable

## 2021-11-23 NOTE — TOC Initial Note (Signed)
Transition of Care [facility]) - Initial/Assessment Note    Patient Details  Name: Tammy Small MRN: [id number] Date of Birth: 11/21/1955  Transition of [facility]) CM/SW Contact:    Beckie Busing, RN Phone [phone]  11/23/2021, 11:46 AM  Clinical Narrative:                 [facility] consulted for patient  discharging with Home Oxygen requirement.  Patient states that she is from home where she was able to function independently prior to discharge. Patient reports that she currently does not have any DME at home and no need DME. Patient has been made aware of need for home oxygen. CM inquired if patient is able to afford medications. Per patient her medications are provided free of charge through med assist and that her medicaid is pending. Currently there are no other needs. TOC will sign off.  Expected Discharge Plan: Home/Self Care Barriers to Discharge: No Barriers Identified   Patient Goals and CMS Choice        Expected Discharge Plan and Services Expected Discharge Plan: Home/Self Care In-house Referral: NA Discharge Planning Services: CM Consult Post Acute Care Choice: Durable Medical Equipment Living arrangements for the past 2 months: Apartment Expected Discharge Date: 11/23/21               DME Arranged: Oxygen DME Agency: [employer] Date DME Agency Contacted: 11/23/21 Time DME Agency Contacted: 1134 Representative spoke with at DME Agency: Silvio Pate HH Arranged: NA HH Agency: NA        Prior Living Arrangements/Services Living arrangements for the past 2 months: Apartment Lives with:: Self Patient language and need for interpreter reviewed:: Yes Do you feel safe going back to the place where you live?: Yes      Need for Family Participation in Patient Care: No (Comment) Care giver support system in place?: No (comment) Current home services:  (n/a) Criminal Activity/Legal Involvement Pertinent to Current Situation/Hospitalization: No - Comment as  needed  Activities of Daily Living Home Assistive Devices/Equipment: CBG Meter, Eyeglasses, Blood pressure cuff, Scales ADL Screening (condition at time of admission) Patient's cognitive ability adequate to safely complete daily activities?: Yes Is the patient deaf or have difficulty hearing?: No Does the patient have difficulty seeing, even when wearing glasses/contacts?: No Does the patient have difficulty concentrating, remembering, or making decisions?: No Patient able to express need for assistance with ADLs?: Yes Does the patient have difficulty dressing or bathing?: No Independently performs ADLs?: Yes (appropriate for developmental age) Does the patient have difficulty walking or climbing stairs?: Yes (SOB on exertion) Weakness of Legs: None Weakness of Arms/Hands: None  Permission Sought/Granted   Permission granted to share information with : No              Emotional Assessment Appearance:: Appears stated age Attitude/Demeanor/Rapport: Gracious Affect (typically observed): Pleasant Orientation: : Oriented to Self, Oriented to Place, Oriented to  Time, Oriented to Situation Alcohol / Substance Use: Not Applicable Psych Involvement: No (comment)  Admission diagnosis:  Pericardial effusion [I31.39] Patient Active Problem List   Diagnosis Date Noted   S/P Xi Robotic assisted Video Assisted Left Pericardial Window Creation 11/18/2021   Positive ANA (antinuclear antibody) 10/07/2021   Chronic respiratory failure with hypoxia (HCC) 08/25/2021   Pericardial effusion 08/25/2021   Acute exacerbation of CHF (congestive heart failure) (HCC) 05/22/2021   Dyspnea and respiratory abnormalities 05/21/2021   Paroxysmal A-fib (HCC) 05/21/2021   HTN (hypertension) 05/21/2021   Obesity (BMI 30-39.9) 05/21/2021  PCP:  Jacquelin Hawking, PA-C Pharmacy:   [facility] [address] [place] - [address] [place] [address] [place] [address] Phone: [phone] Fax:  [phone]  [facility] [place], [place] - [address] [place] [address] [place] [address] Phone: [phone] Fax: [phone]     Social Determinants of Health (SDOH) Interventions    Readmission Risk Interventions No flowsheet data found.

## 2021-11-23 NOTE — Progress Notes (Signed)
SATURATION QUALIFICATIONS: (This note is used to comply with regulatory documentation for home oxygen)  Patient Saturations on Room Air at Rest = 84%  Patient Saturations on Room Air while Ambulating = N/A  Patient Saturations on 4 Liters of oxygen while Ambulating = 89%  Please briefly explain why patient needs home oxygen: Patient requires supplemental oxygen to maintain SpO2 >/88% with mobility.  Mabeline Caras, PT, DPT Acute Rehabilitation Services  Pager [phone] Office [phone]

## 2021-11-23 NOTE — TOC Progression Note (Signed)
Transition of Care [facility]) - Progression Note    Patient Details  Name: Tammy Small MRN: [id number] Date of Birth: 05-09-1956  Transition of Care [facility]) CM/SW Contact  Beckie Busing, RN Phone Number:[phone]  11/23/2021, 10:19 AM  Clinical Narrative:    Orders noted for Home O2 for patient discharging home. Nurse Shanda Bumps has been notified of need for O2 qualification noted before O2 can be set up. TOC will continue to follow.         Expected Discharge Plan and Services           Expected Discharge Date: 11/23/21                                     Social Determinants of Health (SDOH) Interventions    Readmission Risk Interventions No flowsheet data found.

## 2021-11-23 NOTE — Progress Notes (Signed)
Patient had an episode of low blood sugar where her CBG was at 47, she was given orange juice and rechecked, CBG then 56, patient was then given more juice and a snack after she spoke of not eating anything at all during the day, Explained the importance of a healthy diet with diabetes as well as the healing process. After her snack her CBG came up too 104 then 165. Will continue to monitor.

## 2021-11-23 NOTE — Progress Notes (Signed)
Follow up to consultation conducted with patient on 11/19/2021 regarding Advance Directive. Patient was sitting up in chair resting. Patient had visitor in room with her. Patient stated that she has not yet completed the A.D. provided earlier, but still has it there to complete. Asked if there was any other way that I could support her. Spoke with nurse advised patient was now being discharged.

## 2021-11-23 NOTE — Progress Notes (Signed)
° °   °  [address]       [place] [address]             [phone]      6 Days Post-Op Procedure(s) (LRB): RIGHT XI ROBOTIC ASSISTED THORACOSCOPY PERICARDIAL WINDOW (Right)  Subjective:  Patient looks good this morning.  She is feeling much better this morning.  Nausea improved.  She has moved her bowels.  Objective: Vital signs in last 24 hours: Temp:  [97.3 F (36.3 C)-98.5 F (36.9 C)] 98.5 F (36.9 C) (01/17 0354) Pulse Rate:  [74-99] 85 (01/17 0354) Cardiac Rhythm: Atrial fibrillation (01/16 1940) Resp:  [10-20] 19 (01/17 0354) BP: (95-145)/(66-90) 125/76 (01/17 0354) SpO2:  [88 %-97 %] 91 % (01/17 0354)  Intake/Output from previous day: 01/16 0701 - 01/17 0700 In: 840 [P.O.:840] Out: 55 [Chest Tube:55]  General appearance: alert, cooperative, and no distress Heart: regular rate and rhythm Lungs: clear to auscultation bilaterally Abdomen: soft, non-tender; bowel sounds normal; no masses,  no organomegaly Extremities: extremities normal, atraumatic, no cyanosis or edema Wound: clean and dry  Lab Results: No results for input(s): WBC, HGB, HCT, PLT in the last 72 hours. BMET:  Recent Labs    11/22/21 0041  NA 133*  K 4.1  CL 100  CO2 28  GLUCOSE 107*  BUN 18  CREATININE 0.96  CALCIUM 9.0    PT/INR: No results for input(s): LABPROT, INR in the last 72 hours. ABG    Component Value Date/Time   PHART 7.408 11/17/2021 1614   HCO3 26.4 11/17/2021 1614   TCO2 28 11/17/2021 1614   ACIDBASEDEF 2.0 11/17/2021 1528   O2SAT 98.0 11/17/2021 1614   CBG (last 3)  Recent Labs    11/22/21 2239 11/22/21 2333 11/23/21 0617  GLUCAP 104* 165* 103*    Assessment/Plan: S/P Procedure(s) (LRB): RIGHT XI ROBOTIC ASSISTED THORACOSCOPY PERICARDIAL WINDOW (Right)  CV- hemodynamically stable in NSR- continue Lopressor, Eliquis Pulm- CT output 55 cc yesterday, will d/c today 3. GI- constipation resolved, nausea improved, will provide prn zofran at  discharge 4. CBGs- episodes of hypoglycemia, patients oral intake has been poor due to nausea.Marland Kitchenencouraged to increase oral intake now that nausea has resolved 5. Dispo- patient stable, CT output is low will d/c drain today, nausea/constipation resolved this should help with oral intake, hypoglycemia improved after patient drank juice, oral intake encouraged.Marland Kitchen as discussed with Dr. Cliffton Asters will d/c home today   LOS: 6 days   Lowella Dandy, PA-C 11/23/2021

## 2021-11-23 NOTE — Plan of Care (Signed)
°  Problem: Education: Goal: Knowledge of General Education information will improve Description: Including pain rating scale, medication(s)/side effects and non-pharmacologic comfort measures Outcome: Progressing   Problem: Health Behavior/Discharge Planning: Goal: Ability to manage health-related needs will improve Outcome: Progressing   Problem: Clinical Measurements: Goal: Diagnostic test results will improve Outcome: Progressing Goal: Respiratory complications will improve Outcome: Progressing   Problem: Activity: Goal: Risk for activity intolerance will decrease Outcome: Progressing   Problem: Nutrition: Goal: Adequate nutrition will be maintained Outcome: Progressing   Problem: Safety: Goal: Ability to remain free from injury will improve Outcome: Progressing   Problem: Skin Integrity: Goal: Risk for impaired skin integrity will decrease Outcome: Progressing

## 2021-11-23 NOTE — Evaluation (Signed)
Physical Therapy Evaluation Patient Details Name: Tammy Small MRN: [id number] DOB: 03-Mar-1956 Today's Date: 11/23/2021  History of Present Illness  Pt is a 66 y.o. female with pericardial effusion admitted 11/17/21 for robotic-assisted R video thoracoscopy, pericardial window. PMH includes CHF, CKD, HTN, PAF, DM2, HLD.   Clinical Impression  Pt presents with an overall decrease in functional mobility secondary to above. PTA, pt independent, lives alone; plans to have friends stay at d/c to provide initial support. Today, pt moving fairly well with intermittent min guard for balance; of note, pt has not ambulated since admission. Educ re: transfer/gait training with rollator, activity recommendations, energy conservation strategies, current O2 needs. Per chart, pt preparing for d/c home today; if to remain admitted, pt would benefit from continued acute PT services to maximize functional mobility and independence prior to d/c with HHPT services.       Recommendations for follow up therapy are one component of a multi-disciplinary discharge planning process, led by the attending physician.  Recommendations may be updated based on patient status, additional functional criteria and insurance authorization.  Follow Up Recommendations Home health PT    Assistance Recommended at Discharge Frequent or constant Supervision/Assistance  Patient can return home with the following  A little help with bathing/dressing/bathroom;Assistance with cooking/housework;Assist for transportation;Help with stairs or ramp for entrance    Equipment Recommendations Rollator (4 wheels);BSC/3in1  Recommendations for Other Services       Functional Status Assessment Patient has had a recent decline in their functional status and demonstrates the ability to make significant improvements in function in a reasonable and predictable amount of time.     Precautions / Restrictions Precautions Precautions: Fall;Other  (comment) Precaution Comments: Watch SpO2 (does not wear baseline) Restrictions Weight Bearing Restrictions: No      Mobility  Bed Mobility Overal bed mobility: Modified Independent             General bed mobility comments: increased time    Transfers Overall transfer level: Needs assistance Equipment used: Rollator (4 wheels) Transfers: Sit to/from Stand Sit to Stand: Supervision           General transfer comment: multiple sit<>stands from EOB, BSC and recliner, cues to lock rollator brakes, supervision for safety/lines    Ambulation/Gait Ambulation/Gait assistance: Min guard, Supervision Gait Distance (Feet): 100 Feet Assistive device: Rollator (4 wheels) Gait Pattern/deviations: Step-through pattern, Decreased stride length, Trunk flexed Gait velocity: Decreased     General Gait Details: Very slow, fatigued gait with rollator and intermittent min guard for balance; pt requiring intermittent cues to increase gait speed as slowed speed resulting in prolonged single leg stance time and posterior lean; 1x standing rest break secondary to fatigue and SOB  Stairs            Wheelchair Mobility    Modified Rankin (Stroke Patients Only)       Balance Overall balance assessment: Needs assistance   Sitting balance-Leahy Scale: Good Sitting balance - Comments: indep with pericare sitting on BSC     Standing balance-Leahy Scale: Fair Standing balance comment: can static stand without UE support                             Pertinent Vitals/Pain Pain Assessment Pain Assessment: Faces Faces Pain Scale: Hurts a little bit Pain Location: abdomen Pain Descriptors / Indicators: Sore Pain Intervention(s): Monitored during session    Home Living Family/patient expects to be discharged to:: Private residence  Living Arrangements: Alone Available Help at Discharge: Friend(s);Available 24 hours/day Type of Home: House Home Access: Stairs to  enter   [employer] of Steps: 4   Home Layout: One level Home Equipment: None Additional Comments: Friends plan to stay with pt to provide initial assist    Prior Function Prior Level of Function : Independent/Modified Independent             Mobility Comments: Typically independent without DME ADLs Comments: Typically stands to shower, but has some difficulty getting into/out of tall tub     Hand Dominance        Extremity/Trunk Assessment   Upper Extremity Assessment Upper Extremity Assessment: Generalized weakness    Lower Extremity Assessment Lower Extremity Assessment: Generalized weakness       Communication   Communication: No difficulties  Cognition Arousal/Alertness: Awake/alert Behavior During Therapy: WFL for tasks assessed/performed, Flat affect Overall Cognitive Status: Within Functional Limits for tasks assessed                                          General Comments General comments (skin integrity, edema, etc.): pt required 4L O2 to maintain SpO2 >/89% with ambulation; pt has not walked since admission, only getting up to use BSC. Pt's friend present who will take pt home today and provide assist; educ pt and friend on activity recommendations and some energy conservation strategies    Exercises     Assessment/Plan    PT Assessment Patient needs continued PT services  PT Problem List Decreased strength;Decreased activity tolerance;Decreased balance;Decreased mobility;Decreased knowledge of use of DME;Cardiopulmonary status limiting activity       PT Treatment Interventions DME instruction;Gait training;Stair training;Functional mobility training;Therapeutic activities;Therapeutic exercise;Balance training;Patient/family education    PT Goals (Current goals can be found in the Care Plan section)  Acute Rehab PT Goals Patient Stated Goal: home today PT Goal Formulation: With patient Time For Goal Achievement:  12/07/21 Potential to Achieve Goals: Good    Frequency Min 3X/week     Co-evaluation               AM-PAC PT "6 Clicks" Mobility  Outcome Measure Help needed turning from your back to your side while in a flat bed without using bedrails?: None Help needed moving from lying on your back to sitting on the side of a flat bed without using bedrails?: A Little Help needed moving to and from a bed to a chair (including a wheelchair)?: A Little Help needed standing up from a chair using your arms (e.g., wheelchair or bedside chair)?: A Little Help needed to walk in hospital room?: A Little Help needed climbing 3-5 steps with a railing? : A Little 6 Click Score: 19    End of Session Equipment Utilized During Treatment: Gait belt;Oxygen Activity Tolerance: Patient limited by fatigue Patient left: in chair;with call bell/phone within reach;with family/visitor present Nurse Communication: Mobility status PT Visit Diagnosis: Other abnormalities of gait and mobility (R26.89);Muscle weakness (generalized) (M62.81)    Time: [id number] PT Time Calculation (min) (ACUTE ONLY): 30 min   Charges:   PT Evaluation $PT Eval Moderate Complexity: 1 Mod PT Treatments $Gait Training: 8-22 mins      Mabeline Caras, PT, DPT Acute Rehabilitation Services  Pager [phone] Office [place] 11/23/2021, 12:10 PM

## 2021-11-26 ENCOUNTER — Ambulatory Visit (INDEPENDENT_AMBULATORY_CARE_PROVIDER_SITE_OTHER): Payer: Self-pay | Admitting: Thoracic Surgery (Cardiothoracic Vascular Surgery)

## 2021-11-26 ENCOUNTER — Other Ambulatory Visit: Payer: Self-pay

## 2021-11-26 VITALS — BP 149/75 | HR 76 | Resp 20 | Ht 62.0 in

## 2021-11-26 DIAGNOSIS — Z9889 Other specified postprocedural states: Secondary | ICD-10-CM

## 2021-11-26 NOTE — Progress Notes (Signed)
° °   °  [address]       [place] [address]             [phone]        Kallie Depolo [facility] Medical Record #[id number] Date of Birth: [birth year]  Referring: Jonelle Sidle, MD Primary Care: Jacquelin Hawking, PA-C Primary Cardiologist:Samuel Diona Browner, MD  Reason for visit:   follow-up  History of Present Illness:     Ms. Riccobono presents for 1 week follow-up appointment.  She denies any chest pain.  She remains on supplemental oxygen.  Physical Exam: BP (!) 149/75 (BP Location: Right Arm, Patient Position: Sitting)    Pulse 76    Resp 20    Ht 5\' 2"  (1.575 m)    SpO2 96% Comment: 3L O2 [place]   BMI 38.78 kg/m   Alert NAD Incision, stitch removed.   Abdomen soft, ND    Diagnostic Studies & Laboratory data:  Path: Pericardial biopsy showed mild chronic inflammation and focal fibrosis.  No malignancy or granulomas    Assessment / Plan:   66 year old female status post robotic assisted right pericardial window overall she is doing well.  She will follow-up in 1 month with a chest x-ray.   77 11/26/2021 4:02 PM

## 2021-12-07 ENCOUNTER — Telehealth: Payer: Self-pay | Admitting: *Deleted

## 2021-12-07 NOTE — Telephone Encounter (Signed)
Patient called stating she was doing light housework around the house and had to stop due to SOB. Per patient she was not wearing her oxygen during this time of activity. Patient states she stopped cleaning, sat down, replaced her oxygen and was able to regain her breath. Patient states she is not SOB at rest but becomes winded with activity. Patient states she does not have a pulse oximeter to check oxygen saturation. Advised patient to slowly progress activity and to wear her oxygen during these times. Patient verbalizes understanding.

## 2021-12-13 ENCOUNTER — Encounter: Payer: Self-pay | Admitting: Cardiology

## 2021-12-13 NOTE — Progress Notes (Signed)
Cardiology Office Note  Date: 12/14/2021   ID: Tammy Small, DOB [birth year], MRN [id number]  PCP:  Soyla Dryer, PA-C  Cardiologist:  Rozann Lesches, MD Electrophysiologist:  None   Chief Complaint  Patient presents with   Cardiac follow-up    History of Present Illness: Shadreka Riofrio is a 66 y.o. female last seen in October 2022 by Ms. Strader PA-C.  She is status post robot-assisted right pericardial window with Dr. Kipp Brood on January 12.  Pericardial biopsy demonstrated chronic inflammation and focal fibrosis with no evidence of malignancy or granulomatous disease.  AFB and fungal cultures negative.  She had a follow-up visit with Dr. Kipp Brood in late January, I reviewed the note.  She has a return visit later this month.  We went over her current medications.  Blood pressure remains elevated, she states that systolics at home are in the 130s to 140s.  We discussed addition of losartan.  She does not report any sense of palpitations, generally has NYHA class II-III dyspnea depending on level of activity.  Also still using supplemental oxygen at home.  She had follow-up with Dr. Elsworth Soho in October of last year with plan for sleep study, although this was never completed.  Past Medical History:  Diagnosis Date   (HFpEF) heart failure with preserved ejection fraction (HCC)    CKD (chronic kidney disease)    Essential hypertension    Hyperlipidemia    PAF (paroxysmal atrial fibrillation) (HCC)    Pericardial effusion    Pneumonia    Type 2 diabetes mellitus ([place])     Past Surgical History:  Procedure Laterality Date   CESAREAN SECTION     HERNIA REPAIR     XI ROBOTIC ASSISTED PERICARDIAL WINDOW Right 11/17/2021   Procedure: RIGHT XI ROBOTIC ASSISTED THORACOSCOPY PERICARDIAL WINDOW;  Surgeon: Lajuana Matte, MD;  Location: [facility] OR;  Service: Thoracic;  Laterality: Right;    Current Outpatient Medications  Medication Sig Dispense Refill   apixaban (ELIQUIS) 5 MG  TABS tablet Take 1 tablet (5 mg total) by mouth 2 (two) times daily. 180 tablet 3   atorvastatin (LIPITOR) 20 MG tablet TAKE 1 Tablet BY MOUTH ONCE EVERY DAY 90 tablet 0   Cholecalciferol (VITAMIN D3 PO) Take 1 tablet by mouth daily.     Cyanocobalamin (B-12 PO) Take 1 tablet by mouth daily.     furosemide (LASIX) 40 MG tablet Take 1 tablet (40 mg total) by mouth daily. 270 tablet 3   losartan (COZAAR) 25 MG tablet Take 1 tablet (25 mg total) by mouth daily. 90 tablet 3   metoprolol tartrate (LOPRESSOR) 25 MG tablet Take 25 mg by mouth 2 (two) times daily.     Omega-3 Fatty Acids (OMEGA 3 PO) Take 1 capsule by mouth daily.     potassium chloride SA (KLOR-CON M) 20 MEQ tablet Take 1 tablet (20 mEq total) by mouth daily. 180 tablet 3   No current facility-administered medications for this visit.   Allergies:  Patient has no known allergies.   ROS: No syncope.  Physical Exam: VS:  BP (!) 160/98    Pulse 84    Ht 5\' 2"  (1.575 m)    Wt 202 lb (91.6 kg)    SpO2 92%    BMI 36.95 kg/m , BMI Body mass index is 36.95 kg/m.  Wt Readings from Last 3 Encounters:  12/14/21 202 lb (91.6 kg)  11/17/21 212 lb (96.2 kg)  11/15/21 212 lb 9.6 oz (96.4 kg)  General: Patient appears comfortable at rest. HEENT: Conjunctiva and lids normal, wearing a mask. Neck: Supple, no elevated JVP or carotid bruits, no thyromegaly. Lungs: Clear to auscultation, nonlabored breathing at rest. Cardiac: Irregularly irregular, no S3, 1/6 systolic murmur. Extremities: Chronic appearing edema/lymphedema.  ECG:  An ECG dated 11/15/2021 was personally reviewed today and demonstrated:  Atrial fibrillation with LVH and repolarization abnormalities.  Recent Labwork: 05/21/2021: Magnesium 1.9 09/20/2021: B Natriuretic Peptide 1,049.0 11/19/2021: ALT 8; AST 15; Hemoglobin 14.9; Platelets 174 11/22/2021: BUN 18; Creatinine, Ser 0.96; Potassium 4.1; Sodium 133     Component Value Date/Time   CHOL 92 09/28/2021 0934   TRIG 82  09/28/2021 0934   HDL 32 (L) 09/28/2021 0934   CHOLHDL 2.9 09/28/2021 0934   VLDL 16 09/28/2021 0934   LDLCALC 44 09/28/2021 0934    Other Studies Reviewed Today:  Echocardiogram 10/11/2021:  1. There is a large pericardial effusion present that is mainly located  posteriorly to the left ventricle. The effusion measures 2.5cm at  end-diastole in largest diameter. While the IVC is enlarged and does not  collapse 50% with inspiration, the RV is  well expanded with no diastolic collapse visualized. No tamponade.   2. Left ventricular ejection fraction, by estimation, is 70 to 75%. The  left ventricle has hyperdynamic function. There is moderate concentric  left ventricular hypertrophy. Diastolic function indeterminant due to  Afib.   3. Left atrial size was moderately dilated.   4. Right atrial size was severely dilated.   5. Right ventricular systolic function is mildly reduced. The right  ventricular size is mildly enlarged.   Chest CT 11/02/2021: IMPRESSION: 1. Cardiomegaly with moderate-sized pericardial effusion. 2. Dilated pulmonary artery suggesting pulmonary artery hypertension. 3. Mild interstitial edema. 4. Coronary artery disease and atherosclerotic disease.   Aortic Atherosclerosis (ICD10-I70.0).  Assessment and Plan:  1.  History of large circumferential pericardial effusion now status post drainage with pericardial window as discussed above, likely inflammatory in etiology.  Plan to obtain a follow-up limited echocardiogram for reevaluation and new baseline.  2.  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  She continues on Lasix, also using supplemental oxygen with plan to follow-up for further sleep study evaluation.  We will plan to get her back to see [facility].  With concurrent elevation in blood pressure also  starting losartan 25 mg daily, recheck BMET in 2 weeks.  3.  Persistent atrial fibrillation, managing as heart rate control strategy at this time on Lopressor, also on Eliquis with CHA2DS2-VASc score of 3-4.  Medication Adjustments/Labs and Tests Ordered: Current medicines are reviewed at length with the patient today.  Concerns regarding medicines are outlined above.   Tests Ordered: Orders Placed This Encounter  Procedures   Basic metabolic panel   Ambulatory referral to Pulmonology   ECHOCARDIOGRAM LIMITED    Medication Changes: Meds ordered this encounter  Medications   losartan (COZAAR) 25 MG tablet    Sig: Take 1 tablet (25 mg total) by mouth daily.    Dispense:  90 tablet    Refill:  3    Disposition:  Follow up  3 months.  Signed, Satira Sark, MD, [facility] 12/14/2021 9:10 AM    [facility] [address], [place], [place] [address] Phone: [phone]; Fax: [phone]

## 2021-12-14 ENCOUNTER — Telehealth: Payer: Self-pay | Admitting: Cardiology

## 2021-12-14 ENCOUNTER — Ambulatory Visit (INDEPENDENT_AMBULATORY_CARE_PROVIDER_SITE_OTHER): Payer: Self-pay | Admitting: Cardiology

## 2021-12-14 ENCOUNTER — Encounter: Payer: Self-pay | Admitting: Cardiology

## 2021-12-14 ENCOUNTER — Other Ambulatory Visit: Payer: Self-pay

## 2021-12-14 VITALS — BP 160/98 | HR 84 | Ht 62.0 in | Wt 202.0 lb

## 2021-12-14 DIAGNOSIS — I4819 Other persistent atrial fibrillation: Secondary | ICD-10-CM

## 2021-12-14 DIAGNOSIS — I10 Essential (primary) hypertension: Secondary | ICD-10-CM

## 2021-12-14 DIAGNOSIS — G4730 Sleep apnea, unspecified: Secondary | ICD-10-CM

## 2021-12-14 DIAGNOSIS — Z79899 Other long term (current) drug therapy: Secondary | ICD-10-CM

## 2021-12-14 DIAGNOSIS — I3139 Other pericardial effusion (noninflammatory): Secondary | ICD-10-CM

## 2021-12-14 MED ORDER — LOSARTAN POTASSIUM 25 MG PO TABS: 25.0000 mg | ORAL_TABLET | Freq: Every day | ORAL | 3 refills | Status: DC

## 2021-12-14 MED ORDER — LOSARTAN POTASSIUM 25 MG PO TABS: 25.0000 mg | ORAL_TABLET | Freq: Every day | ORAL | 3 refills | Status: AC

## 2021-12-14 NOTE — Patient Instructions (Signed)
Medication Instructions:  START Cozaar 25 mg daily   Labwork: BMET in 2 weeks  (2/21)  Testing/Procedures: Your physician has requested that you have a LIMITED echocardiogram. Echocardiography is a painless test that uses sound waves to create images of your heart. It provides your doctor with information about the size and shape of your heart and how well your hearts chambers and valves are working. This procedure takes approximately one hour. There are no restrictions for this procedure.   Follow-Up: 3 months  Any Other Special Instructions Will Be Listed Below (If Applicable).   You have been referred to pulmonary to follow up with Dr.Alva.   If you need a refill on your cardiac medications before your next appointment, please call your pharmacy.

## 2021-12-14 NOTE — Telephone Encounter (Signed)
Pt c/o medication issue:  1. Name of Medication: losartan (COZAAR) 25 MG tablet  2. How are you currently taking this medication (dosage and times per day)?  Take 1 tablet (25 mg total) by mouth daily.  3. Are you having a reaction (difficulty breathing--STAT)? no  4. What is your medication issue? Pt is needing this medicine sent to her local pharmacy.. states that it is going to take too long to wait for it to come in the mail... please advise    [facility] [address], [place] - [address] [place] [address]  Phone:  [phone] Fax:  [phone]

## 2021-12-14 NOTE — Telephone Encounter (Signed)
Refill resent to [facility] in [place]

## 2021-12-15 ENCOUNTER — Other Ambulatory Visit: Payer: Self-pay | Admitting: Physician Assistant

## 2021-12-16 ENCOUNTER — Other Ambulatory Visit: Payer: Self-pay | Admitting: Physician Assistant

## 2021-12-16 DIAGNOSIS — N189 Chronic kidney disease, unspecified: Secondary | ICD-10-CM

## 2021-12-16 DIAGNOSIS — I10 Essential (primary) hypertension: Secondary | ICD-10-CM

## 2021-12-16 DIAGNOSIS — E785 Hyperlipidemia, unspecified: Secondary | ICD-10-CM

## 2021-12-16 DIAGNOSIS — I509 Heart failure, unspecified: Secondary | ICD-10-CM

## 2021-12-16 LAB — FUNGUS CULTURE RESULT

## 2021-12-16 LAB — FUNGUS CULTURE WITH STAIN

## 2021-12-16 LAB — FUNGAL ORGANISM REFLEX

## 2021-12-28 ENCOUNTER — Ambulatory Visit: Payer: Medicaid Other | Admitting: Physician Assistant

## 2021-12-31 LAB — ACID FAST CULTURE WITH REFLEXED SENSITIVITIES (MYCOBACTERIA): Acid Fast Culture: NEGATIVE

## 2022-01-07 ENCOUNTER — Ambulatory Visit (HOSPITAL_COMMUNITY): Payer: Medicaid Other

## 2022-01-12 ENCOUNTER — Encounter (HOSPITAL_COMMUNITY): Payer: Self-pay

## 2022-01-12 ENCOUNTER — Inpatient Hospital Stay (HOSPITAL_COMMUNITY)
Admission: EM | Admit: 2022-01-12 | Discharge: 2022-02-05 | DRG: 193 | Disposition: E | Payer: Self-pay | Attending: Family Medicine | Admitting: Family Medicine

## 2022-01-12 ENCOUNTER — Other Ambulatory Visit: Payer: Self-pay

## 2022-01-12 ENCOUNTER — Emergency Department (HOSPITAL_COMMUNITY): Payer: Self-pay

## 2022-01-12 DIAGNOSIS — I5082 Biventricular heart failure: Secondary | ICD-10-CM | POA: Diagnosis present

## 2022-01-12 DIAGNOSIS — Z66 Do not resuscitate: Secondary | ICD-10-CM | POA: Diagnosis present

## 2022-01-12 DIAGNOSIS — J9811 Atelectasis: Secondary | ICD-10-CM | POA: Diagnosis present

## 2022-01-12 DIAGNOSIS — R112 Nausea with vomiting, unspecified: Secondary | ICD-10-CM | POA: Diagnosis present

## 2022-01-12 DIAGNOSIS — R609 Edema, unspecified: Secondary | ICD-10-CM

## 2022-01-12 DIAGNOSIS — J9621 Acute and chronic respiratory failure with hypoxia: Secondary | ICD-10-CM | POA: Diagnosis present

## 2022-01-12 DIAGNOSIS — E669 Obesity, unspecified: Secondary | ICD-10-CM | POA: Diagnosis present

## 2022-01-12 DIAGNOSIS — I083 Combined rheumatic disorders of mitral, aortic and tricuspid valves: Secondary | ICD-10-CM | POA: Diagnosis present

## 2022-01-12 DIAGNOSIS — Z833 Family history of diabetes mellitus: Secondary | ICD-10-CM

## 2022-01-12 DIAGNOSIS — I50813 Acute on chronic right heart failure: Secondary | ICD-10-CM

## 2022-01-12 DIAGNOSIS — I4892 Unspecified atrial flutter: Secondary | ICD-10-CM | POA: Diagnosis present

## 2022-01-12 DIAGNOSIS — I480 Paroxysmal atrial fibrillation: Secondary | ICD-10-CM

## 2022-01-12 DIAGNOSIS — R627 Adult failure to thrive: Secondary | ICD-10-CM | POA: Diagnosis present

## 2022-01-12 DIAGNOSIS — Z9889 Other specified postprocedural states: Secondary | ICD-10-CM

## 2022-01-12 DIAGNOSIS — I3139 Other pericardial effusion (noninflammatory): Secondary | ICD-10-CM | POA: Diagnosis present

## 2022-01-12 DIAGNOSIS — E119 Type 2 diabetes mellitus without complications: Secondary | ICD-10-CM

## 2022-01-12 DIAGNOSIS — I272 Pulmonary hypertension, unspecified: Secondary | ICD-10-CM | POA: Diagnosis present

## 2022-01-12 DIAGNOSIS — Z79899 Other long term (current) drug therapy: Secondary | ICD-10-CM

## 2022-01-12 DIAGNOSIS — Z6835 Body mass index (BMI) 35.0-35.9, adult: Secondary | ICD-10-CM

## 2022-01-12 DIAGNOSIS — J189 Pneumonia, unspecified organism: Principal | ICD-10-CM

## 2022-01-12 DIAGNOSIS — I4819 Other persistent atrial fibrillation: Secondary | ICD-10-CM | POA: Diagnosis present

## 2022-01-12 DIAGNOSIS — E785 Hyperlipidemia, unspecified: Secondary | ICD-10-CM | POA: Diagnosis present

## 2022-01-12 DIAGNOSIS — Z515 Encounter for palliative care: Secondary | ICD-10-CM

## 2022-01-12 DIAGNOSIS — R0602 Shortness of breath: Secondary | ICD-10-CM

## 2022-01-12 DIAGNOSIS — N179 Acute kidney failure, unspecified: Secondary | ICD-10-CM | POA: Diagnosis present

## 2022-01-12 DIAGNOSIS — I130 Hypertensive heart and chronic kidney disease with heart failure and stage 1 through stage 4 chronic kidney disease, or unspecified chronic kidney disease: Secondary | ICD-10-CM | POA: Diagnosis present

## 2022-01-12 DIAGNOSIS — I10 Essential (primary) hypertension: Secondary | ICD-10-CM | POA: Diagnosis present

## 2022-01-12 DIAGNOSIS — I2609 Other pulmonary embolism with acute cor pulmonale: Secondary | ICD-10-CM

## 2022-01-12 DIAGNOSIS — R591 Generalized enlarged lymph nodes: Secondary | ICD-10-CM | POA: Diagnosis present

## 2022-01-12 DIAGNOSIS — Z8249 Family history of ischemic heart disease and other diseases of the circulatory system: Secondary | ICD-10-CM

## 2022-01-12 DIAGNOSIS — T17908A Unspecified foreign body in respiratory tract, part unspecified causing other injury, initial encounter: Secondary | ICD-10-CM

## 2022-01-12 DIAGNOSIS — I509 Heart failure, unspecified: Secondary | ICD-10-CM

## 2022-01-12 DIAGNOSIS — J90 Pleural effusion, not elsewhere classified: Secondary | ICD-10-CM

## 2022-01-12 DIAGNOSIS — Z20822 Contact with and (suspected) exposure to covid-19: Secondary | ICD-10-CM | POA: Diagnosis present

## 2022-01-12 DIAGNOSIS — I959 Hypotension, unspecified: Secondary | ICD-10-CM | POA: Diagnosis present

## 2022-01-12 DIAGNOSIS — N183 Chronic kidney disease, stage 3 unspecified: Secondary | ICD-10-CM | POA: Diagnosis present

## 2022-01-12 DIAGNOSIS — Z87891 Personal history of nicotine dependence: Secondary | ICD-10-CM

## 2022-01-12 DIAGNOSIS — T398X5A Adverse effect of other nonopioid analgesics and antipyretics, not elsewhere classified, initial encounter: Secondary | ICD-10-CM | POA: Diagnosis present

## 2022-01-12 DIAGNOSIS — E1122 Type 2 diabetes mellitus with diabetic chronic kidney disease: Secondary | ICD-10-CM | POA: Diagnosis present

## 2022-01-12 DIAGNOSIS — I5033 Acute on chronic diastolic (congestive) heart failure: Secondary | ICD-10-CM

## 2022-01-12 DIAGNOSIS — I2781 Cor pulmonale (chronic): Secondary | ICD-10-CM | POA: Diagnosis present

## 2022-01-12 DIAGNOSIS — Z7901 Long term (current) use of anticoagulants: Secondary | ICD-10-CM

## 2022-01-12 DIAGNOSIS — J918 Pleural effusion in other conditions classified elsewhere: Secondary | ICD-10-CM | POA: Diagnosis present

## 2022-01-12 DIAGNOSIS — R590 Localized enlarged lymph nodes: Secondary | ICD-10-CM | POA: Diagnosis present

## 2022-01-12 DIAGNOSIS — J9601 Acute respiratory failure with hypoxia: Secondary | ICD-10-CM

## 2022-01-12 DIAGNOSIS — R17 Unspecified jaundice: Secondary | ICD-10-CM | POA: Diagnosis present

## 2022-01-12 DIAGNOSIS — R600 Localized edema: Secondary | ICD-10-CM

## 2022-01-12 DIAGNOSIS — Z825 Family history of asthma and other chronic lower respiratory diseases: Secondary | ICD-10-CM

## 2022-01-12 LAB — CBC WITH DIFFERENTIAL/PLATELET
Abs Immature Granulocytes: 0.04 10*3/uL (ref 0.00–0.07)
Basophils Absolute: 0 10*3/uL (ref 0.0–0.1)
Basophils Relative: 0 %
Eosinophils Absolute: 0 10*3/uL (ref 0.0–0.5)
Eosinophils Relative: 1 %
HCT: 50.8 % — ABNORMAL HIGH (ref 36.0–46.0)
Hemoglobin: 16.1 g/dL — ABNORMAL HIGH (ref 12.0–15.0)
Immature Granulocytes: 1 %
Lymphocytes Relative: 16 %
Lymphs Abs: 1.2 10*3/uL (ref 0.7–4.0)
MCH: 30.1 pg (ref 26.0–34.0)
MCHC: 31.7 g/dL (ref 30.0–36.0)
MCV: 95.1 fL (ref 80.0–100.0)
Monocytes Absolute: 1.4 10*3/uL — ABNORMAL HIGH (ref 0.1–1.0)
Monocytes Relative: 19 %
Neutro Abs: 5 10*3/uL (ref 1.7–7.7)
Neutrophils Relative %: 63 %
Platelets: 232 10*3/uL (ref 150–400)
RBC: 5.34 MIL/uL — ABNORMAL HIGH (ref 3.87–5.11)
RDW: 14.6 % (ref 11.5–15.5)
WBC: 7.8 10*3/uL (ref 4.0–10.5)
nRBC: 0 % (ref 0.0–0.2)

## 2022-01-12 LAB — BLOOD GAS, ARTERIAL
Acid-Base Excess: 2.7 mmol/L — ABNORMAL HIGH (ref 0.0–2.0)
Bicarbonate: 27.9 mmol/L (ref 20.0–28.0)
Drawn by: 38235
FIO2: 100 %
O2 Saturation: 98.5 %
Patient temperature: 37
pCO2 arterial: 44 mmHg (ref 32–48)
pH, Arterial: 7.41 (ref 7.35–7.45)
pO2, Arterial: 95 mmHg (ref 83–108)

## 2022-01-12 LAB — COMPREHENSIVE METABOLIC PANEL
ALT: 14 U/L (ref 0–44)
AST: 16 U/L (ref 15–41)
Albumin: 4.1 g/dL (ref 3.5–5.0)
Alkaline Phosphatase: 91 U/L (ref 38–126)
Anion gap: 12 (ref 5–15)
BUN: 13 mg/dL (ref 8–23)
CO2: 25 mmol/L (ref 22–32)
Calcium: 9.2 mg/dL (ref 8.9–10.3)
Chloride: 103 mmol/L (ref 98–111)
Creatinine, Ser: 0.91 mg/dL (ref 0.44–1.00)
GFR, Estimated: >60 mL/min (ref >60)
Glucose, Bld: 175 mg/dL — ABNORMAL HIGH (ref 70–99)
Potassium: 3.7 mmol/L (ref 3.5–5.1)
Sodium: 140 mmol/L (ref 135–145)
Total Bilirubin: 3.9 mg/dL — ABNORMAL HIGH (ref 0.3–1.2)
Total Protein: 7.4 g/dL (ref 6.5–8.1)

## 2022-01-12 LAB — RESP PANEL BY RT-PCR (FLU A&B, COVID) ARPGX2
Influenza A by PCR: NEGATIVE
Influenza B by PCR: NEGATIVE
SARS Coronavirus 2 by RT PCR: NEGATIVE

## 2022-01-12 LAB — BRAIN NATRIURETIC PEPTIDE: B Natriuretic Peptide: 963 pg/mL — ABNORMAL HIGH (ref 0.0–100.0)

## 2022-01-12 LAB — PROCALCITONIN: Procalcitonin: <0.1 ng/mL

## 2022-01-12 LAB — TROPONIN I (HIGH SENSITIVITY)
Troponin I (High Sensitivity): 23 ng/L — ABNORMAL HIGH (ref <18)
Troponin I (High Sensitivity): 22 ng/L — ABNORMAL HIGH (ref <18)

## 2022-01-12 MED ORDER — LEVALBUTEROL HCL 0.63 MG/3ML IN NEBU
0.6300 mg | INHALATION_SOLUTION | Freq: Once | RESPIRATORY_TRACT | Status: DC
  Filled 2022-01-12: qty 3

## 2022-01-12 MED ORDER — IPRATROPIUM-ALBUTEROL 0.5-2.5 (3) MG/3ML IN SOLN
3.0000 mL | RESPIRATORY_TRACT | Status: DC | PRN
Start: 2022-01-12 — End: 2022-01-18
  Administered 2022-01-15 – 2022-01-17 (×3): 3 mL via RESPIRATORY_TRACT
  Filled 2022-01-12 (×3): qty 3

## 2022-01-12 MED ORDER — APIXABAN 5 MG PO TABS
5.0000 mg | ORAL_TABLET | Freq: Two times a day (BID) | ORAL | Status: DC
  Administered 2022-01-12 – 2022-01-17 (×10): 5 mg via ORAL
  Filled 2022-01-12 (×10): qty 1

## 2022-01-12 MED ORDER — KETOROLAC TROMETHAMINE 15 MG/ML IJ SOLN
15.0000 mg | Freq: Once | INTRAMUSCULAR | Status: AC
  Administered 2022-01-13: 15 mg via INTRAVENOUS
  Filled 2022-01-12: qty 1

## 2022-01-12 MED ORDER — GUAIFENESIN-DM 100-10 MG/5ML PO SYRP
10.0000 mL | ORAL_SOLUTION | Freq: Three times a day (TID) | ORAL | Status: DC
  Administered 2022-01-12 – 2022-01-17 (×10): 10 mL via ORAL
  Filled 2022-01-12 (×11): qty 10

## 2022-01-12 MED ORDER — SODIUM CHLORIDE 0.9 % IV SOLN
500.0000 mg | INTRAVENOUS | Status: DC
  Administered 2022-01-12 – 2022-01-17 (×6): 500 mg via INTRAVENOUS
  Filled 2022-01-12 (×6): qty 5

## 2022-01-12 MED ORDER — ONDANSETRON HCL 4 MG PO TABS: 4.0000 mg | ORAL_TABLET | Freq: Four times a day (QID) | ORAL | Status: DC | PRN

## 2022-01-12 MED ORDER — HYDROMORPHONE HCL 1 MG/ML IJ SOLN
0.5000 mg | Freq: Once | INTRAMUSCULAR | Status: AC
  Administered 2022-01-12: 0.5 mg via INTRAVENOUS
  Filled 2022-01-12: qty 1

## 2022-01-12 MED ORDER — ACETAMINOPHEN 650 MG RE SUPP: 650.0000 mg | Freq: Four times a day (QID) | RECTAL | Status: DC | PRN

## 2022-01-12 MED ORDER — FUROSEMIDE 10 MG/ML IJ SOLN
40.0000 mg | Freq: Two times a day (BID) | INTRAMUSCULAR | Status: DC
  Administered 2022-01-13 (×2): 40 mg via INTRAVENOUS
  Filled 2022-01-12 (×2): qty 4

## 2022-01-12 MED ORDER — FUROSEMIDE 10 MG/ML IJ SOLN
80.0000 mg | Freq: Once | INTRAMUSCULAR | Status: AC
  Administered 2022-01-12: 80 mg via INTRAVENOUS
  Filled 2022-01-12: qty 8

## 2022-01-12 MED ORDER — MORPHINE SULFATE (PF) 2 MG/ML IV SOLN
2.0000 mg | Freq: Once | INTRAVENOUS | Status: AC
  Administered 2022-01-12: 2 mg via INTRAVENOUS
  Filled 2022-01-12: qty 1

## 2022-01-12 MED ORDER — LOSARTAN POTASSIUM 25 MG PO TABS
25.0000 mg | ORAL_TABLET | Freq: Every day | ORAL | Status: DC
  Administered 2022-01-13: 10:00:00 25 mg via ORAL
  Filled 2022-01-12: qty 1

## 2022-01-12 MED ORDER — CEFTRIAXONE SODIUM 2 G IJ SOLR
2.0000 g | INTRAMUSCULAR | Status: DC
Start: 2022-01-13 — End: 2022-01-18
  Administered 2022-01-13 – 2022-01-18 (×6): 2 g via INTRAVENOUS
  Filled 2022-01-12 (×6): qty 20

## 2022-01-12 MED ORDER — ATORVASTATIN CALCIUM 20 MG PO TABS
20.0000 mg | ORAL_TABLET | Freq: Every evening | ORAL | Status: DC
Start: 2022-01-13 — End: 2022-01-18
  Administered 2022-01-13 – 2022-01-16 (×4): 20 mg via ORAL
  Filled 2022-01-12 (×4): qty 1

## 2022-01-12 MED ORDER — VANCOMYCIN HCL 1750 MG/350ML IV SOLN
1750.0000 mg | Freq: Once | INTRAVENOUS | Status: DC
Start: 2022-01-12 — End: 2022-01-12
  Administered 2022-01-12: 1750 mg via INTRAVENOUS
  Filled 2022-01-12: qty 350

## 2022-01-12 MED ORDER — ONDANSETRON HCL 4 MG/2ML IJ SOLN
4.0000 mg | Freq: Once | INTRAMUSCULAR | Status: AC
  Administered 2022-01-12: 4 mg via INTRAVENOUS
  Filled 2022-01-12: qty 2

## 2022-01-12 MED ORDER — ONDANSETRON HCL 4 MG/2ML IJ SOLN
4.0000 mg | Freq: Four times a day (QID) | INTRAMUSCULAR | Status: DC | PRN
  Administered 2022-01-13 – 2022-01-17 (×4): 4 mg via INTRAVENOUS
  Filled 2022-01-12 (×6): qty 2

## 2022-01-12 MED ORDER — SODIUM CHLORIDE 0.9 % IV SOLN
2.0000 g | Freq: Once | INTRAVENOUS | Status: AC
  Administered 2022-01-12: 2 g via INTRAVENOUS
  Filled 2022-01-12: qty 2

## 2022-01-12 MED ORDER — ACETAMINOPHEN 325 MG PO TABS
650.0000 mg | ORAL_TABLET | Freq: Four times a day (QID) | ORAL | Status: DC | PRN
  Administered 2022-01-13 – 2022-01-17 (×7): 650 mg via ORAL
  Filled 2022-01-12 (×7): qty 2

## 2022-01-12 MED ORDER — IOHEXOL 350 MG/ML SOLN
100.0000 mL | Freq: Once | INTRAVENOUS | Status: AC | PRN
  Administered 2022-01-12: 75 mL via INTRAVENOUS

## 2022-01-12 MED ORDER — METOPROLOL TARTRATE 25 MG PO TABS
25.0000 mg | ORAL_TABLET | Freq: Two times a day (BID) | ORAL | Status: DC
  Administered 2022-01-12 – 2022-01-13 (×3): 25 mg via ORAL
  Filled 2022-01-12 (×3): qty 1

## 2022-01-12 NOTE — Assessment & Plan Note (Addendum)
Currently in atrial fibrillation.   ?Mild RVR yesterday when metop was held - improved today ?Continue Eliquis and metoprolol (with holding parameters) ?

## 2022-01-12 NOTE — Progress Notes (Signed)
Patient transported to ICU from ED on BIPAP with no adverse events noted. ?

## 2022-01-12 NOTE — Assessment & Plan Note (Addendum)
Now on pressors ?Hold losartan  ?Hold metoprolol ?

## 2022-01-12 NOTE — H&P (Addendum)
History and Physical    Kamil Mcneeley [id number] DOB: 01-Jun-1956 DOA: 01/25/2022  PCP: Soyla Dryer, PA-C   Patient coming from: Home  I have personally briefly reviewed patient's old medical records in [place]  Chief Complaint: Difficulty breathing  HPI: Tammy Small is a 66 y.o. female with medical history significant for congestive heart failure, hypertension, atrial fibrillation, pericardial effusion with recent robotic assisted pericardial window procedure. Patient presented to the Ed with c/o chest pain and difficulty breathing that started yesterday. At the time of my evaluation, patient is on BIPAP, history is limited from patient. She reports pressure-like central chest pain along with difficulty breathing. Cough also. No fever. She denies lower extremity swelling. And she has been complaint with lasix 40mg  daily and Eliquis 2ce a day.  Recent hospitalization 1/11 to 1/17-patient underwent Xi robotic assisted right thoracoscopy, pericardial window 11/17/2021.  Stay was complicated by AKI felt secondary to Toradol, and moderate drainage from pericardial tube for several days after surgery, hypoglycemia.  These improved prior to discharge.  ED Course: O2 sats down to 80% on room air, she was placed on 15 L nonrebreather, O2 sats dropped to 87 percent, patient with increased work of breathing she was subsequently placed on BiPAP with significant improvement in tachypnea and dyspnea.  Afebrile.  Heart rate 80s to 90s.  Respiratory rate 20-47.  Blood pressure systolic [id number].  COVID test negative. CTA chest-no PE, but shows moderate size right pleural effusion with associated subtotal collapse of the right lower lobe.  Patchy airspace opacities in right middle and left lower lobe.  Also findings suspicious for right heart failure. IV Lasix 80 mg given.  IV vancomycin and cefepime started.  Review of Systems: As per HPI all other systems reviewed and negative.  Past  Medical History:  Diagnosis Date   (HFpEF) heart failure with preserved ejection fraction (HCC)    CKD (chronic kidney disease)    Essential hypertension    Hyperlipidemia    PAF (paroxysmal atrial fibrillation) (HCC)    Pericardial effusion    Pneumonia    Type 2 diabetes mellitus ([place])     Past Surgical History:  Procedure Laterality Date   CESAREAN SECTION     HERNIA REPAIR     XI ROBOTIC ASSISTED PERICARDIAL WINDOW Right 11/17/2021   Procedure: RIGHT XI ROBOTIC ASSISTED THORACOSCOPY PERICARDIAL WINDOW;  Surgeon: Lajuana Matte, MD;  Location: [facility];  Service: Thoracic;  Laterality: Right;     reports that she quit smoking about 49 years ago. Her smoking use included cigarettes. She has a 1.50 pack-year smoking history. She has never used smokeless tobacco. She reports that she does not currently use alcohol. She reports that she does not currently use drugs after having used the following drugs: Cocaine.  No Known Allergies  Family History  Problem Relation Age of Onset   Hypertension Father    Diabetes Father    Asthma Brother    Bronchitis Brother     Prior to Admission medications   Medication Sig Start Date End Date Taking? Authorizing Provider  apixaban (ELIQUIS) 5 MG TABS tablet Take 1 tablet (5 mg total) by mouth 2 (two) times daily. 05/24/21  Yes Soyla Dryer, PA-C  atorvastatin (LIPITOR) 20 MG tablet TAKE 1 Tablet BY MOUTH ONCE EVERY DAY Patient taking differently: Take 20 mg by mouth every evening. 12/15/21  Yes Soyla Dryer, PA-C  Cholecalciferol (VITAMIN D3 PO) Take 1 tablet by mouth daily.   Yes [provider]  Cyanocobalamin (B-12 PO) Take 1 tablet by mouth daily.   Yes [provider]  furosemide (LASIX) 40 MG tablet Take 1 tablet (40 mg total) by mouth daily. 11/23/21  Yes Barrett, Erin R, PA-C  losartan (COZAAR) 25 MG tablet Take 1 tablet (25 mg total) by mouth daily. 12/14/21 03/14/22 Yes Satira Sark, MD  metoprolol  tartrate (LOPRESSOR) 25 MG tablet Take 25 mg by mouth 2 (two) times daily.   Yes [provider]  Omega-3 Fatty Acids (OMEGA 3 PO) Take 1 capsule by mouth daily.   Yes [provider]  potassium chloride SA (KLOR-CON M) 20 MEQ tablet Take 1 tablet (20 mEq total) by mouth daily. 11/23/21  Yes Barrett, Lodema Hong, PA-C    Physical Exam: Vitals:   01/25/2022 1800 01/18/2022 1830 01/27/2022 1900 01/21/2022 2000  BP: 122/88 (!) 124/98 123/85 137/85  Pulse: 89 78 92 86  Resp: (!) 21 (!) 21 20 (!) 21  SpO2: 97% 97% 95% 96%  Weight:      Height:        Constitutional: On BiPAP Vitals:   02/04/2022 1800 02/02/2022 1830 01/11/2022 1900 01/14/2022 2000  BP: 122/88 (!) 124/98 123/85 137/85  Pulse: 89 78 92 86  Resp: (!) 21 (!) 21 20 (!) 21  SpO2: 97% 97% 95% 96%  Weight:      Height:       Eyes: PERRL, lids and conjunctivae normal ENMT: On BiPAP.  Neck: normal, supple, no masses, no thyromegaly Respiratory: Equal breath sounds bilaterally, on BiPAP Cardiovascular: Irregular rate and rhythm, no murmurs / rubs / gallops. No pitting extremity edema.  Lower extremity warm and well-perfused. Abdomen: no tenderness, no masses palpated. No hepatosplenomegaly. Bowel sounds positive.  Musculoskeletal: no clubbing / cyanosis. No joint deformity upper and lower extremities. Good ROM, no contractures. Normal muscle tone.  Skin: no rashes, lesions, ulcers. No induration Neurologic: No acute cranial nerve abnormality, moving extremities spontaneously. Psychiatric: Patient is tearful, as she is back in the hospital again, appears anxious.   Labs on Admission: I have personally reviewed following labs and imaging studies  CBC: Recent Labs  Lab 01/07/2022 1549  WBC 7.8  NEUTROABS 5.0  HGB 16.1*  HCT 50.8*  MCV 95.1  PLT [id number]   Basic Metabolic Panel: Recent Labs  Lab 01/30/2022 1549  NA 140  K 3.7  CL 103  CO2 25  GLUCOSE 175*  BUN 13  CREATININE 0.91  CALCIUM 9.2   GFR: Estimated  Creatinine Clearance: 64 mL/min (by C-G formula based on SCr of 0.91 mg/dL). Liver Function Tests: Recent Labs  Lab 02/04/2022 1549  AST 16  ALT 14  ALKPHOS 91  BILITOT 3.9*  PROT 7.4  ALBUMIN 4.1   Radiological Exams on Admission: CT Angio Chest PE W/Cm &/Or Wo Cm  Addendum Date: 01/29/2022   ADDENDUM REPORT: 01/07/2022 19:24 ADDENDUM: Although reported in the findings section, an additional finding of potential significance was not included in the impression. There is possible right supraclavicular lymphadenopathy. Consider further evaluation with neck CT, ideally after the patient's acute shortness of breath has resolved. Electronically Signed   By: Richardean Sale M.D.   On: 02/02/2022 19:24   Result Date: 01/26/2022 CLINICAL DATA:  Shortness of breath with chest pain and pressure since yesterday. Clinical concern for pulmonary embolism. EXAM: CT ANGIOGRAPHY CHEST WITH CONTRAST TECHNIQUE: Multidetector CT imaging of the chest was performed using the standard protocol during bolus administration of intravenous contrast. Multiplanar CT image  reconstructions and MIPs were obtained to evaluate the vascular anatomy. RADIATION DOSE REDUCTION: This exam was performed according to the departmental dose-optimization program which includes automated exposure control, adjustment of the mA and/or kV according to patient size and/or use of iterative reconstruction technique. CONTRAST:  53mL OMNIPAQUE IOHEXOL 350 MG/ML SOLN COMPARISON:  Radiographs today and 11/22/2021. CT without contrast 11/02/2021. FINDINGS: Cardiovascular: The pulmonary arteries are well opacified with contrast to the level of the subsegmental branches. There is mildly heterogeneous enhancement within the lower lobe subsegmental branches, although no discrete intravascular filling defects are seen to suggest acute pulmonary embolism. There is atherosclerosis of the aorta, great vessels and coronary arteries. The heart is moderately enlarged.  No significant pericardial fluid. Mediastinum/Nodes: There are no enlarged mediastinal, hilar or axillary lymph nodes.There are small mediastinal lymph nodes, and there is suspicion of an enlarged right supraclavicular node measuring 2.3 x 1.4 cm on image 11/4. The thyroid gland, trachea and esophagus demonstrate no significant findings. Lungs/Pleura: New moderate-sized dependent right pleural effusion with associated compressive right lower lobe atelectasis. Additional patchy airspace opacities are present within the right middle lobe and left lower lobe, probably atelectasis as well. Underlying centrilobular emphysema and central airway thickening are noted. Upper abdomen: Reflux of contrast into the IVC and hepatic veins. The visualized upper abdomen is otherwise unremarkable. Musculoskeletal/Chest wall: There is no chest wall mass or suspicious osseous finding. Mild thoracic spondylosis. Review of the MIP images confirms the above findings. IMPRESSION: 1. No evidence of acute pulmonary embolism. Peripheral branch assessment is limited by breathing artifact and atelectasis. 2. Moderate size right pleural effusion with associated subtotal collapse of the right lower lobe. Additional patchy airspace opacities in the right middle and left lower lobes. Recommend radiographic follow-up. 3. Cardiomegaly with reflux of contrast into the IVC and hepatic veins suspicious for right heart failure. Coronary and Aortic Atherosclerosis (ICD10-I70.0). 4.  Emphysema (ICD10-J43.9). Electronically Signed: By: Richardean Sale M.D. On: 01/20/2022 19:07   DG Chest Port 1 View  Result Date: 01/18/2022 CLINICAL DATA:  Chest pain and shortness of breath. EXAM: PORTABLE CHEST 1 VIEW COMPARISON:  Chest radiograph 11/22/2021 FINDINGS: The cardiac silhouette remains moderately enlarged. The pericardial drain has been removed. A small right pleural effusion is new or larger than on the prior study. There is associated right basilar  atelectasis or consolidation. There is mild residual opacity in the left lung base which has improved from prior. A trace left pleural effusion is possible. No pneumothorax is identified. No acute osseous abnormality is seen. IMPRESSION: 1. Small right pleural effusion with right basilar atelectasis or consolidation. 2. Improved aeration of the left lung base. 3. Unchanged cardiomegaly. Electronically Signed   By: Logan Bores M.D.   On: 01/09/2022 16:05    EKG: Independently reviewed.  Atrial fibrillation rate 99.  QTc 474.  No significant change from prior.  Assessment/Plan Principal Problem:   Acute respiratory failure with hypoxia (HCC) Active Problems:   Paroxysmal A-fib (HCC)   Acute exacerbation of CHF (congestive heart failure) (HCC)   Pneumonia   HTN (hypertension)   S/P Xi Robotic assisted Video Assisted Left Pericardial Window Creation   Supraclavicular lymphadenopathy    Assessment and Plan: * Acute respiratory failure with hypoxia (HCC) Likely secondary to pneumonia and moderate pleural effusion with subtotal lower lobe collapse- from decompensated CHF.  O2 sats down to 80% on room air, initially placed on 15 L nonrebreather, became more dyspneic and O2 sats dropped to 88%.  Subsequently placed on  BiPAP with improvement in tachypnea and dyspnea.  Patient feels better.  Previously on home O2, this was discontinued but she had done well without it until now. -Continue BiPAP -Obtain ABG -DuoNebs as needed -N.p.o. for now except for meds pending improvement in respiratory status   Pneumonia Dyspnea and cough, apneic, with acute respiratory failure..  Afebrile.  No leukocytosis.  Rules out for sepsis.  CTA chest shows patchy airspace opacities in the right middle and left lower lobes. -IV ceftriaxone and azithromycin -Mucolytic's as needed  - DuoNebs as needed -Check procalcitonin  Acute exacerbation of CHF (congestive heart failure) (HCC) CTA chest showed moderate pleural  effusion with associated subtotal left lung collapse.  BNP elevated at 963.  Troponin 23 > 2.  EKG shows atrial fibrillation without significant ST or T wave changes.  Echo 10/2022 shows EF of 70 to 75%.  Reports compliance with 40 mg Lasix daily. -Obtain echo status post pericardial window -IV Lasix 80 mg x 1 given in ED, continue 40 twice daily -Strict input output, daily weights, strict Daily BMP  Paroxysmal A-fib (HCC) Currently in atrial fibrillation.  Rate controlled, and on anticoagulation reports compliance with Eliquis. - Resume Eliquis and metoprolol  HTN (hypertension) Stable. -Hold losartan for contrast exposure -Resume metoprolol  Supraclavicular lymphadenopathy Incidental CT findings - there are small mediastinal lymph nodes, and there is suspicion of an enlarged right supraclavicular node measuring 2.3 x 1.4 cm.   Radiology recommendation- consider further evaluation with neck CT, ideally after the patient's acute shortness of breath has resolved.  S/P Xi Robotic assisted Video Assisted Left Pericardial Window Creation Admitted 1/11 to 1/17, underwent Xi robotic assisted right thoracoscopy, pericardial window on 1/11.  Pericardial biopsy showed mild chronic inflammation and focal fibrosis.  No malignancy or granulomas.  She followed up postdischarge with CTS- Dr. Kipp Brood, 1/20.  Patient was doing well.   - Subsequently followed-up with Dr. Conni Elliot 2/7, plan was to obtain limited echo for reevaluation and new baseline.  This has not been done.  -Obtain echo   DVT prophylaxis: Eliquis  Code Status: Full code Family Communication: None at bedside.  She does not want me to call any family yet. Disposition Plan: > 2 days Consults called:  none Admission status: inpt stepdown. I certify that at the point of admission it is my clinical judgment that the patient will require inpatient hospital care spanning beyond 2 midnights from the point of admission due to high intensity  of service, high risk for further deterioration and high frequency of surveillance required.    Bethena Roys MD [facility]  01/10/2022, 9:28 PM

## 2022-01-12 NOTE — ED Notes (Addendum)
Pt care taken, no complaints at this time, a&o x 4, bipap in place. ?

## 2022-01-12 NOTE — ED Notes (Signed)
Dr. Deretha Emory called to bedside due to pt's increased work of breathing.  ?

## 2022-01-12 NOTE — ED Triage Notes (Signed)
Reports cp that started  yesterday that is midsternal and pressure in nature.  Sob with cp.  Pt has hx of afib.  Reports cough.  Denies fever.   ?

## 2022-01-12 NOTE — Assessment & Plan Note (Addendum)
Admitted 1/11 to 1/17, underwent Xi robotic assisted right thoracoscopy, pericardial window on 1/11.  Pericardial biopsy showed mild chronic inflammation and focal fibrosis.  No malignancy or granulomas.  She followed up postdischarge with CTS- Dr. Cliffton Asters, 1/20.  Patient was doing well.   ?- Subsequently followed-up with Dr. Cindra Eves 2/7, plan was to obtain limited echo for reevaluation and new baseline.  This has not been done.  ?-Obtain echo (as above) ?

## 2022-01-12 NOTE — Assessment & Plan Note (Signed)
Incidental CT findings - there are small mediastinal lymph nodes, and there is suspicion of an enlarged right supraclavicular node measuring 2.3 x 1.4 cm.   ?Radiology recommendation- consider further evaluation with neck CT, ideally after the patient's acute shortness of breath has resolved. ?

## 2022-01-12 NOTE — ED Notes (Signed)
Have called report to ICU and called respiratory to go with me. ?

## 2022-01-12 NOTE — Assessment & Plan Note (Addendum)
Likely secondary to pneumonia and moderate pleural effusion with subtotal lower lobe collapse.  Possible contribution from HF. ?Symptomatically improved since thora ?Continues with significant O2 requirement, on 14 L ?CXR 3/10 with enlarged cardiac silhouette, L basilar airspace opacity, improved R effusion ?See below ?CXR 3/11 pending ?

## 2022-01-12 NOTE — ED Provider Notes (Addendum)
[facility] Provider Note   CSN: [id number] Arrival date & time: 01/05/2022  1519     History  Chief Complaint  Patient presents with   Chest Pain    Tammy Small is a 66 y.o. female.  Patient brought in by family member for shortness of breath.  Patient states shortness of breath has been ongoing since yesterday.  Also associated with chest pain that started yesterday in the midsternal area.  Patient describes the chest pain as pressure.  Cardiac monitor shows atrial for patient has a known history of atrial fib.  Is on Eliquis.  Patient states it feels like she did when the fluid had built up around her heart.  Patient hypoxic on presentation here with oxygen sats around 84%.  Patient started on nonrebreather and 2 L nasal cannula.  Respiratory therapy was requested to see her.  Patient states her bilateral leg swelling is not significantly changed from baseline.  Patient with recent admission followed by cardiology.  Patient was admitted January 11 through January 17 for robotic assisted left pericardial window for pericardial effusion.  According to those notes patient is active problem list includes positive ANA chronic respiratory failure with hypoxia patient was on home oxygen but that was stopped a couple weeks ago.  But patient did okay initially.  Also list includes acute exacerbation of congestive heart failure proximal atrial fibs hypertension and obesity.       Home Medications Prior to Admission medications   Medication Sig Start Date End Date Taking? Authorizing Provider  apixaban (ELIQUIS) 5 MG TABS tablet Take 1 tablet (5 mg total) by mouth 2 (two) times daily. 05/24/21  Yes Soyla Dryer, PA-C  atorvastatin (LIPITOR) 20 MG tablet TAKE 1 Tablet BY MOUTH ONCE EVERY DAY Patient taking differently: Take 20 mg by mouth every evening. 12/15/21  Yes Soyla Dryer, PA-C  Cholecalciferol (VITAMIN D3 PO) Take 1 tablet by mouth daily.   Yes [provider]  Cyanocobalamin (B-12 PO) Take 1 tablet by mouth daily.   Yes [provider]  furosemide (LASIX) 40 MG tablet Take 1 tablet (40 mg total) by mouth daily. 11/23/21  Yes Barrett, Erin R, PA-C  losartan (COZAAR) 25 MG tablet Take 1 tablet (25 mg total) by mouth daily. 12/14/21 03/14/22 Yes Satira Sark, MD  metoprolol tartrate (LOPRESSOR) 25 MG tablet Take 25 mg by mouth 2 (two) times daily.   Yes [provider]  Omega-3 Fatty Acids (OMEGA 3 PO) Take 1 capsule by mouth daily.   Yes [provider]  potassium chloride SA (KLOR-CON M) 20 MEQ tablet Take 1 tablet (20 mEq total) by mouth daily. 11/23/21  Yes Barrett, Erin R, PA-C      Allergies    Patient has no known allergies.    Review of Systems   Review of Systems  Constitutional:  Negative for chills and fever.  HENT:  Negative for ear pain and sore throat.   Eyes:  Negative for pain and visual disturbance.  Respiratory:  Positive for shortness of breath. Negative for cough.   Cardiovascular:  Positive for chest pain and leg swelling. Negative for palpitations.  Gastrointestinal:  Negative for abdominal pain and vomiting.  Genitourinary:  Negative for dysuria and hematuria.  Musculoskeletal:  Negative for arthralgias and back pain.  Skin:  Negative for color change and rash.  Neurological:  Negative for seizures and syncope.  Hematological:  Bruises/bleeds easily.  All other systems reviewed and are negative.  Physical  Exam Updated Vital Signs BP 123/85    Pulse 92    Resp 20    Ht 1.575 m (5\' 2" )    Wt 87.1 kg    SpO2 95%    BMI 35.12 kg/m  Physical Exam Vitals and nursing note reviewed.  Constitutional:      General: She is not in acute distress.    Appearance: Normal appearance. She is well-developed.  HENT:     Head: Normocephalic and atraumatic.  Eyes:     Extraocular Movements: Extraocular movements intact.     Conjunctiva/sclera: Conjunctivae normal.     Pupils: Pupils are  equal, round, and reactive to light.  Cardiovascular:     Rate and Rhythm: Normal rate and regular rhythm.     Heart sounds: No murmur heard. Pulmonary:     Effort: Respiratory distress present.     Breath sounds: Normal breath sounds. No wheezing, rhonchi or rales.  Abdominal:     Palpations: Abdomen is soft.     Tenderness: There is no abdominal tenderness.  Musculoskeletal:        General: No swelling.     Cervical back: Normal range of motion and neck supple.     Right lower leg: Edema present.     Left lower leg: Edema present.  Skin:    General: Skin is warm and dry.     Capillary Refill: Capillary refill takes less than 2 seconds.  Neurological:     General: No focal deficit present.     Mental Status: She is alert and oriented to person, place, and time.  Psychiatric:        Mood and Affect: Mood normal.    ED Results / Procedures / Treatments   Labs (all labs ordered are listed, but only abnormal results are displayed) Labs Reviewed  COMPREHENSIVE METABOLIC PANEL - Abnormal; Notable for the following components:      Result Value   Glucose, Bld 175 (*)    Total Bilirubin 3.9 (*)    All other components within normal limits  CBC WITH DIFFERENTIAL/PLATELET - Abnormal; Notable for the following components:   RBC 5.34 (*)    Hemoglobin 16.1 (*)    HCT 50.8 (*)    Monocytes Absolute 1.4 (*)    All other components within normal limits  BRAIN NATRIURETIC PEPTIDE - Abnormal; Notable for the following components:   B Natriuretic Peptide 963.0 (*)    All other components within normal limits  TROPONIN I (HIGH SENSITIVITY) - Abnormal; Notable for the following components:   Troponin I (High Sensitivity) 23 (*)    All other components within normal limits  TROPONIN I (HIGH SENSITIVITY) - Abnormal; Notable for the following components:   Troponin I (High Sensitivity) 22 (*)    All other components within normal limits  RESP PANEL BY RT-PCR (FLU A&B, COVID) [id number]     EKG EKG Interpretation  Date/Time:  Wednesday January 12 2022 15:31:26 EST Ventricular Rate:  99 PR Interval:    QRS Duration: 87 QT Interval:  379 QTC Calculation: 474 R Axis:   -1 Text Interpretation: Atrial fibrillation Abnormal T, consider ischemia, lateral leads Confirmed by Fredia Sorrow [phone]) on 01/22/2022 3:44:23 PM  Radiology CT Angio Chest PE W/Cm &/Or Wo Cm  Addendum Date: 02/02/2022   ADDENDUM REPORT: 01/05/2022 19:24 ADDENDUM: Although reported in the findings section, an additional finding of potential significance was not included in the impression. There is possible right supraclavicular lymphadenopathy. Consider further evaluation with neck  CT, ideally after the patient's acute shortness of breath has resolved. Electronically Signed   By: Richardean Sale M.D.   On: 01/24/2022 19:24   Result Date: 01/31/2022 CLINICAL DATA:  Shortness of breath with chest pain and pressure since yesterday. Clinical concern for pulmonary embolism. EXAM: CT ANGIOGRAPHY CHEST WITH CONTRAST TECHNIQUE: Multidetector CT imaging of the chest was performed using the standard protocol during bolus administration of intravenous contrast. Multiplanar CT image reconstructions and MIPs were obtained to evaluate the vascular anatomy. RADIATION DOSE REDUCTION: This exam was performed according to the departmental dose-optimization program which includes automated exposure control, adjustment of the mA and/or kV according to patient size and/or use of iterative reconstruction technique. CONTRAST:  33mL OMNIPAQUE IOHEXOL 350 MG/ML SOLN COMPARISON:  Radiographs today and 11/22/2021. CT without contrast 11/02/2021. FINDINGS: Cardiovascular: The pulmonary arteries are well opacified with contrast to the level of the subsegmental branches. There is mildly heterogeneous enhancement within the lower lobe subsegmental branches, although no discrete intravascular filling defects are seen to suggest acute pulmonary  embolism. There is atherosclerosis of the aorta, great vessels and coronary arteries. The heart is moderately enlarged. No significant pericardial fluid. Mediastinum/Nodes: There are no enlarged mediastinal, hilar or axillary lymph nodes.There are small mediastinal lymph nodes, and there is suspicion of an enlarged right supraclavicular node measuring 2.3 x 1.4 cm on image 11/4. The thyroid gland, trachea and esophagus demonstrate no significant findings. Lungs/Pleura: New moderate-sized dependent right pleural effusion with associated compressive right lower lobe atelectasis. Additional patchy airspace opacities are present within the right middle lobe and left lower lobe, probably atelectasis as well. Underlying centrilobular emphysema and central airway thickening are noted. Upper abdomen: Reflux of contrast into the IVC and hepatic veins. The visualized upper abdomen is otherwise unremarkable. Musculoskeletal/Chest wall: There is no chest wall mass or suspicious osseous finding. Mild thoracic spondylosis. Review of the MIP images confirms the above findings. IMPRESSION: 1. No evidence of acute pulmonary embolism. Peripheral branch assessment is limited by breathing artifact and atelectasis. 2. Moderate size right pleural effusion with associated subtotal collapse of the right lower lobe. Additional patchy airspace opacities in the right middle and left lower lobes. Recommend radiographic follow-up. 3. Cardiomegaly with reflux of contrast into the IVC and hepatic veins suspicious for right heart failure. Coronary and Aortic Atherosclerosis (ICD10-I70.0). 4.  Emphysema (ICD10-J43.9). Electronically Signed: By: Richardean Sale M.D. On: 01/31/2022 19:07   DG Chest Port 1 View  Result Date: 01/31/2022 CLINICAL DATA:  Chest pain and shortness of breath. EXAM: PORTABLE CHEST 1 VIEW COMPARISON:  Chest radiograph 11/22/2021 FINDINGS: The cardiac silhouette remains moderately enlarged. The pericardial drain has been  removed. A small right pleural effusion is new or larger than on the prior study. There is associated right basilar atelectasis or consolidation. There is mild residual opacity in the left lung base which has improved from prior. A trace left pleural effusion is possible. No pneumothorax is identified. No acute osseous abnormality is seen. IMPRESSION: 1. Small right pleural effusion with right basilar atelectasis or consolidation. 2. Improved aeration of the left lung base. 3. Unchanged cardiomegaly. Electronically Signed   By: Logan Bores M.D.   On: 01/08/2022 16:05    Procedures Procedures    Medications Ordered in ED Medications  levalbuterol (XOPENEX) nebulizer solution 0.63 mg (0.63 mg Nebulization Not Given 01/11/2022 1601)  ceFEPIme (MAXIPIME) 1 g in sodium chloride 0.9 % 100 mL IVPB (has no administration in time range)  furosemide (LASIX) injection 80 mg (80  mg Intravenous Given 01/23/2022 1617)  ondansetron (ZOFRAN) injection 4 mg (4 mg Intravenous Given 01/24/2022 1643)  HYDROmorphone (DILAUDID) injection 0.5 mg (0.5 mg Intravenous Given 02/04/2022 1643)  iohexol (OMNIPAQUE) 350 MG/ML injection 100 mL (75 mLs Intravenous Contrast Given 01/07/2022 1856)    ED Course/ Medical Decision Making/ A&P                           Medical Decision Making Amount and/or Complexity of Data Reviewed Labs: ordered. Radiology: ordered.  Risk Prescription drug management. Decision regarding hospitalization.   CRITICAL CARE Performed by: Fredia Sorrow Total critical care time: 60 minutes Critical care time was exclusive of separately billable procedures and treating other patients. Critical care was necessary to treat or prevent imminent or life-threatening deterioration. Critical care was time spent personally by me on the following activities: development of treatment plan with patient and/or surrogate as well as nursing, discussions with consultants, evaluation of patient's response to treatment,  examination of patient, obtaining history from patient or surrogate, ordering and performing treatments and interventions, ordering and review of laboratory studies, ordering and review of radiographic studies, pulse oximetry and re-evaluation of patient's condition.  Patient's portable chest x-ray without evidence congestive heart failure.  Patient had the pericardial window placed.  So she should not get a buildup of pericardial fluid again.  And less something is gone awry.  Patient's respiratory difficulties continued on the nonrebreather in the 2 L nasal cannula where she still was tachypneic.  Patient seen by respiratory therapy we decided to put her on BiPAP.  The BiPAP is making her feel better.  CT angios get to be done to rule out pulmonary embolus to get a better look at the pericardial area and also clinically although the lungs are clear bilaterally just to rule out congestive heart failure.  Because patient's BNP is elevated in the 900 range.  On the BiPAP patient's blood pressures improved and patient is more comfortable still breathing around 20 times a minute.  Complete metabolic panel without significant abnormalities renal functions normal.  CBC no leukocytosis hemoglobin a little hemoconcentrated 16.1.  BMP as we mention was 963.  Initial troponin was 23 patient's troponins normally are elevated delta troponins pending.  And CT angio chest is pending.  In addition patient given 80 mg of Lasix.  This could also be making her feel better.  Ultimately patient will require admission.  Did consult cardiology at 4:30 in the afternoon but they never called back.  So that is now is probably transition to [facility] cardiology.  But at this point we will wait to talk to them until I have CT angio results.  Patient is showing some signs of improvement.  Patient doing much better on the BiPAP.  CT angio chest showed no evidence of pulmonary embolisms.  Shows a right sided pleural effusion and  questionable right middle lobe pneumonia and left lower lobe pneumonia.  Patient just discharged from the hospital January 17 so this could be healthcare acquired.  Will treat with cefepime and vancomycin for this.  Also evidence of right-sided heart failure.  As we stated before BNP was elevated.  Patient got some Lasix here with improvement.  Patient's blood pressures are improving 2.  We will discussed with the hospitalist.  Had put a consult into cardiology but never got a call.  Can recontact them after speaking with hospitalist.  But if hospitalist feels comfortable keeping patient here overnight they  can consult cardiology in the morning.  Final Clinical Impression(s) / ED Diagnoses Final diagnoses:  SOB (shortness of breath)  Acute respiratory failure with hypoxia (HCC)  HCAP (healthcare-associated pneumonia)  Pleural effusion  Acute on chronic right-sided congestive heart failure [facility])    Rx / DC Orders ED Discharge Orders     None         Fredia Sorrow, MD 01/21/2022 1744    Fredia Sorrow, MD 01/09/2022 1929

## 2022-01-12 NOTE — Assessment & Plan Note (Addendum)
Mild leukocytosis, afebrile  ?CT chest with moderate R pleural effusion with subtotal collapse of RLL and patchy airspace opacities in right middle and left lower lobes ?Continue CAP coverage with ceftriaxone and azithromycin (3/8-present).  With parapneumonic effusion, will need extended course of abx. ?Urine strep negative, legionella negative, sputum cx pending collection ?Negative MRSA pcr ?procalcitonin not particularly impressive ?No blood cx collected ?

## 2022-01-12 NOTE — Assessment & Plan Note (Addendum)
CTA chest showed moderate pleural effusion with associated subtotal left lung collapse.  Also cardiomegaly with reflux of contrast into IVC and hepatic veins. BNP elevated at 963 (1049 in 09/2021).  Troponin 23 > 2.  EKG shows atrial fibrillation.  Echo 10/2022 shows EF of 70 to 75%.  Reports compliance with 40 mg Lasix daily. ?Echo 01/13/2022 with EF 60-65%, no RWMA, severe LVH, severely reduced RVSF, severely elevated PASP ?Hold diuretics for now with low BP's overnight ?Strict I/O, daily weights ?Will consult cardiology  ?

## 2022-01-13 ENCOUNTER — Inpatient Hospital Stay (HOSPITAL_COMMUNITY): Payer: Self-pay

## 2022-01-13 ENCOUNTER — Encounter (HOSPITAL_COMMUNITY): Payer: Self-pay | Admitting: Internal Medicine

## 2022-01-13 DIAGNOSIS — R112 Nausea with vomiting, unspecified: Secondary | ICD-10-CM

## 2022-01-13 DIAGNOSIS — R0603 Acute respiratory distress: Secondary | ICD-10-CM

## 2022-01-13 DIAGNOSIS — J90 Pleural effusion, not elsewhere classified: Secondary | ICD-10-CM | POA: Insufficient documentation

## 2022-01-13 DIAGNOSIS — E119 Type 2 diabetes mellitus without complications: Secondary | ICD-10-CM

## 2022-01-13 LAB — ECHOCARDIOGRAM COMPLETE
Area-P 1/2: 5.23 cm2
Height: 62 in
MV VTI: 3.3 cm2
S' Lateral: 1.4 cm
Weight: 3195.79 oz

## 2022-01-13 LAB — BODY FLUID CELL COUNT WITH DIFFERENTIAL
Eos, Fluid: 0 %
Lymphs, Fluid: 1 %
Monocyte-Macrophage-Serous Fluid: 21 % — ABNORMAL LOW (ref 50–90)
Neutrophil Count, Fluid: 78 % — ABNORMAL HIGH (ref 0–25)
Total Nucleated Cell Count, Fluid: 31800 cu mm — ABNORMAL HIGH (ref 0–1000)

## 2022-01-13 LAB — GRAM STAIN: Gram Stain: NONE SEEN

## 2022-01-13 LAB — CBC
HCT: 46.9 % — ABNORMAL HIGH (ref 36.0–46.0)
Hemoglobin: 14.8 g/dL (ref 12.0–15.0)
MCH: 31.1 pg (ref 26.0–34.0)
MCHC: 31.6 g/dL (ref 30.0–36.0)
MCV: 98.5 fL (ref 80.0–100.0)
Platelets: 205 10*3/uL (ref 150–400)
RBC: 4.76 MIL/uL (ref 3.87–5.11)
RDW: 14.6 % (ref 11.5–15.5)
WBC: 10.7 10*3/uL — ABNORMAL HIGH (ref 4.0–10.5)
nRBC: 0 % (ref 0.0–0.2)

## 2022-01-13 LAB — GLUCOSE, PLEURAL OR PERITONEAL FLUID: Glucose, Fluid: 131 mg/dL

## 2022-01-13 LAB — LACTATE DEHYDROGENASE, PLEURAL OR PERITONEAL FLUID: LD, Fluid: 288 U/L — ABNORMAL HIGH (ref 3–23)

## 2022-01-13 LAB — BASIC METABOLIC PANEL
Anion gap: 11 (ref 5–15)
BUN: 17 mg/dL (ref 8–23)
CO2: 21 mmol/L — ABNORMAL LOW (ref 22–32)
Calcium: 8.5 mg/dL — ABNORMAL LOW (ref 8.9–10.3)
Chloride: 105 mmol/L (ref 98–111)
Creatinine, Ser: 0.99 mg/dL (ref 0.44–1.00)
GFR, Estimated: >60 mL/min (ref >60)
Glucose, Bld: 129 mg/dL — ABNORMAL HIGH (ref 70–99)
Potassium: 4.3 mmol/L (ref 3.5–5.1)
Sodium: 137 mmol/L (ref 135–145)

## 2022-01-13 LAB — STREP PNEUMONIAE URINARY ANTIGEN: Strep Pneumo Urinary Antigen: NEGATIVE

## 2022-01-13 LAB — PROTEIN, PLEURAL OR PERITONEAL FLUID: Total protein, fluid: 3.6 g/dL

## 2022-01-13 LAB — PROCALCITONIN: Procalcitonin: 0.27 ng/mL

## 2022-01-13 LAB — LACTATE DEHYDROGENASE: LDH: 168 U/L (ref 98–192)

## 2022-01-13 LAB — MRSA NEXT GEN BY PCR, NASAL: MRSA by PCR Next Gen: NOT DETECTED

## 2022-01-13 MED ORDER — CHLORHEXIDINE GLUCONATE CLOTH 2 % EX PADS
6.0000 | MEDICATED_PAD | Freq: Every day | CUTANEOUS | Status: DC
  Administered 2022-01-13 – 2022-01-18 (×6): 6 via TOPICAL

## 2022-01-13 MED ORDER — SODIUM CHLORIDE 0.9 % IV BOLUS
500.0000 mL | Freq: Once | INTRAVENOUS | Status: AC
Start: 2022-01-14 — End: 2022-01-14
  Administered 2022-01-13: 23:00:00 500 mL via INTRAVENOUS

## 2022-01-13 MED ORDER — MORPHINE SULFATE (PF) 2 MG/ML IV SOLN: 1.0000 mg | INTRAVENOUS | Status: DC | PRN

## 2022-01-13 MED ORDER — OXYCODONE HCL 5 MG PO TABS
5.0000 mg | ORAL_TABLET | ORAL | Status: DC | PRN
  Administered 2022-01-13 (×2): 5 mg via ORAL
  Filled 2022-01-13 (×2): qty 1

## 2022-01-13 MED ORDER — PROMETHAZINE HCL 12.5 MG PO TABS
12.5000 mg | ORAL_TABLET | Freq: Four times a day (QID) | ORAL | Status: DC | PRN
  Administered 2022-01-13 – 2022-01-15 (×2): 12.5 mg via ORAL
  Filled 2022-01-13 (×2): qty 1

## 2022-01-13 NOTE — Assessment & Plan Note (Addendum)
resolved 

## 2022-01-13 NOTE — Progress Notes (Incomplete)
*  PRELIMINARY RESULTS* ?Echocardiogram ?2D Echocardiogram has been performed. ? ?Tammy Small ?01/13/2022, 11:10 AM ?

## 2022-01-13 NOTE — TOC Progression Note (Signed)
?  Transition of Care (TOC) Screening Note ? ? ?Patient Details  ?Name: Tammy Small ?Date of Birth: 04-Sep-1956 ? ? ?Transition of Care (TOC) CM/SW Contact:    ?Shade Flood, LCSW ?Phone Number: ?01/13/2022, 11:05 AM ? ? ? ?Transition of Care Department [facility]) has reviewed patient and no TOC needs have been identified at this time. We will continue to monitor patient advancement through interdisciplinary progression rounds. If new patient transition needs arise, please place a TOC consult. ? ? ?

## 2022-01-13 NOTE — Assessment & Plan Note (Addendum)
CT with moderate R effusion ?Exudative by lights criteria.  ?Follow thoracentesis and thora labs -> gram stain without organisms, culture pending, cell count 31,800 (78% neutrophils).  PH pending, cytology pending. ? ?

## 2022-01-13 NOTE — Progress Notes (Signed)
Patient taken off Bipap due to N/V.  Currently on 4L [place] with sat of 97%. ?

## 2022-01-13 NOTE — Hospital Course (Signed)
66 yo with hx HFpEF, pericardial effusion now s/p pericardial window 11/2021, atrial fibrillation, HTN, T2DM who presented with chest pain and shortness of breath, found to have right sided pleural effusion with patchy airspace opacities.  Also concern for heart failure exacerbation.  She's been admitted for acute hypoxic respiratory failure, being treated for pneumonia, pleural effusion, and HF exacerbation. ? ?See below for additional details ?

## 2022-01-13 NOTE — Assessment & Plan Note (Signed)
SSI

## 2022-01-13 NOTE — Procedures (Signed)
PreOperative Dx: right pleural effusion ?Postoperative Dx: right pleural effusion ?Procedure:   US guided right thoracentesis ?Radiologist:  Thornton Papas ?Anesthesia:  10 ml of 1% lidocaine ?Specimen:  700 ML of dark yellow colored fluid ?EBL:   < 1 ml ?Complications:  None   ?

## 2022-01-13 NOTE — Progress Notes (Signed)
[id number] pt having N/V. PRN IV zofran given. Bipap removed and pt it currently on 4L [place]. ?

## 2022-01-13 NOTE — Progress Notes (Signed)
PROGRESS NOTE    Tammy Small  [id number] DOB: 12-19-1955 DOA: 01/24/2022 PCP: Soyla Dryer, PA-C  Chief Complaint  Patient presents with   Chest Pain    Brief Narrative:  66 yo with hx HFpEF, pericardial effusion now s/p pericardial window 11/2021, atrial fibrillation, HTN, T2DM who presented with chest pain and shortness of breath, found to have right sided pleural effusion with patchy airspace opacities.  Also concern for heart failure exacerbation.  She's been admitted for acute hypoxic respiratory failure, being treated for pneumonia, pleural effusion, and HF exacerbation.  See below for additional details    Assessment & Plan:   Principal Problem:   Acute respiratory failure with hypoxia (HCC) Active Problems:   Pneumonia   Pleural effusion   Acute exacerbation of CHF (congestive heart failure) (HCC)   Paroxysmal Deyton Ellenbecker-fib (HCC)   Supraclavicular lymphadenopathy   S/P Xi Robotic assisted Video Assisted Left Pericardial Window Creation   HTN (hypertension)   Type 2 diabetes mellitus (HCC)   Assessment and Plan: * Acute respiratory failure with hypoxia (HCC) Likely secondary to pneumonia and moderate pleural effusion with subtotal lower lobe collapse.  Possible contribution from HF. Initially on bipap last night Now weaned to 4 L [place] See below   Pleural effusion CT with moderate R effusion Unclear etiology at this point, possibly 2/2 heart failure  Follow thoracentesis and thora labs  Pneumonia Mild leukocytosis, afebrile  CT chest with moderate R pleural effusion with subtotal collapse of RLL and patchy airspace opacities in right middle and left lower lobes Continue CAP coverage with ceftriaxone and azithromycin Urine strep, legionella, sputum cx Negative MRSA pcr procalcitonin not particularly impressive Follow blood cultures  Acute exacerbation of CHF (congestive heart failure) (HCC) CTA chest showed moderate pleural effusion with associated subtotal  left lung collapse.  Also cardiomegaly with reflux of contrast into IVC and hepatic veins. BNP elevated at 963 (1049 in 09/2021).  Troponin 23 > 2.  EKG shows atrial fibrillation.  Echo 10/2022 shows EF of 70 to 75%.  Reports compliance with 40 mg Lasix daily. Echo 01/13/2022 with EF 60-65%, no RWMA, severe LVH, severely reduced RVSF, severely elevated PASP Lasix 40 mg IV BID Strict I/O, daily weights Will consult cardiology   Supraclavicular lymphadenopathy Incidental CT findings - there are small mediastinal lymph nodes, and there is suspicion of an enlarged right supraclavicular node measuring 2.3 x 1.4 cm.   Radiology recommendation- consider further evaluation with neck CT, ideally after the patient's acute shortness of breath has resolved.  Paroxysmal Tammy Small-fib (HCC) Currently in atrial fibrillation.  Rate controlled, and on anticoagulation reports compliance with Eliquis. Continue Eliquis and metoprolol  S/P Xi Robotic assisted Video Assisted Left Pericardial Window Creation Admitted 1/11 to 1/17, underwent Xi robotic assisted right thoracoscopy, pericardial window on 1/11.  Pericardial biopsy showed mild chronic inflammation and focal fibrosis.  No malignancy or granulomas.  She followed up postdischarge with CTS- Dr. Kipp Brood, 1/20.  Patient was doing well.   - Subsequently followed-up with Dr. Conni Elliot 2/7, plan was to obtain limited echo for reevaluation and new baseline.  This has not been done.  -Obtain echo (as above)  HTN (hypertension) Stable. Hold losartan  Resume metoprolol  Type 2 diabetes mellitus (HCC) SSI    DVT prophylaxis: eliquis Code Status: full Family Communication: none Disposition:   Status is: Inpatient Remains inpatient appropriate because: need for IV diuresis, cardiology c/s, IV abx   Consultants:  Cardiology IR  Procedures:  Echo IMPRESSIONS  1. Pericardial effuson gone since window performed.   2. RV function much worse compared to TTE  10/11/21.   3. Left ventricular ejection fraction, by estimation, is 60 to 65%. The  left ventricle has normal function. The left ventricle has no regional  wall motion abnormalities. There is severe left ventricular hypertrophy.  Left ventricular diastolic parameters   are indeterminate.   4. Right ventricular systolic function is severely reduced. The right  ventricular size is severely enlarged. There is severely elevated  pulmonary artery systolic pressure.   5. Left atrial size was moderately dilated.   6. Right atrial size was severely dilated.   7. The mitral valve is abnormal. No evidence of mitral valve  regurgitation. No evidence of mitral stenosis.   8. The aortic valve is tricuspid. There is mild calcification of the  aortic valve. Aortic valve regurgitation is not visualized. Aortic valve  sclerosis is present, with no evidence of aortic valve stenosis.   9. The inferior vena cava is normal in size with greater than 50%  respiratory variability, suggesting right atrial pressure of 3 mmHg.   Antimicrobials:  Anti-infectives (From admission, onward)    Start     Dose/Rate Route Frequency Ordered Stop   01/13/22 0500  cefTRIAXone (ROCEPHIN) 2 g in sodium chloride 0.9 % 100 mL IVPB        2 g 200 mL/hr over 30 Minutes Intravenous Every 24 hours 01/18/2022 2104     01/07/2022 2115  azithromycin (ZITHROMAX) 500 mg in sodium chloride 0.9 % 250 mL IVPB        500 mg 250 mL/hr over 60 Minutes Intravenous Every 24 hours 02/03/2022 2104     02/02/2022 2030  vancomycin (VANCOREADY) IVPB 1750 mg/350 mL  Status:  Discontinued        1,750 mg 175 mL/hr over 120 Minutes Intravenous  Once 01/05/2022 1955 01/14/2022 2104   01/15/2022 1930  ceFEPIme (MAXIPIME) 2 g in sodium chloride 0.9 % 100 mL IVPB        2 g 200 mL/hr over 30 Minutes Intravenous  Once 01/17/2022 1927 01/25/2022 2031       Subjective: C/o some nausea, asking about cranberry juice  Objective: Vitals:   01/13/22 1005 01/13/22 1100  01/13/22 1130 01/13/22 1200  BP:  132/67  110/67  Pulse: 100 (!) 104 99 94  Resp:  (!) 30 (!) 24 (!) 24  Temp:      TempSrc:      SpO2:  95% 96% 100%  Weight:      Height:        Intake/Output Summary (Last 24 hours) at 01/13/2022 1206 Last data filed at 01/13/2022 0700 Gross per 24 hour  Intake 387.13 ml  Output --  Net 387.13 ml   Filed Weights   01/14/2022 1523 02/04/2022 2337 01/13/22 0438  Weight: 87.1 kg 90.5 kg 90.6 kg    Examination:  General exam: mildly ill appearing, tired appearing Respiratory system: diminished on R Cardiovascular system: irregularly irregular. Gastrointestinal system: Abdomen is nondistended, soft and nontender.  Central nervous system: Alert and oriented. No focal neurological deficits. Extremities: no LEE. Skin: No rashes, lesions or ulcers  Data Reviewed: I have personally reviewed following labs and imaging studies  CBC: Recent Labs  Lab 02/01/2022 1549 01/13/22 0424  WBC 7.8 10.7*  NEUTROABS 5.0  --   HGB 16.1* 14.8  HCT 50.8* 46.9*  MCV 95.1 98.5  PLT 232 [id number]    Basic Metabolic Panel: Recent Labs  Lab 01/15/2022 1549 01/13/22 0424  NA 140 137  K 3.7 4.3  CL 103 105  CO2 25 21*  GLUCOSE 175* 129*  BUN 13 17  CREATININE 0.91 0.99  CALCIUM 9.2 8.5*    GFR: Estimated Creatinine Clearance: 60.1 mL/min (by C-G formula based on SCr of 0.99 mg/dL).  Liver Function Tests: Recent Labs  Lab 01/30/2022 1549  AST 16  ALT 14  ALKPHOS 91  BILITOT 3.9*  PROT 7.4  ALBUMIN 4.1    CBG: No results for input(s): GLUCAP in the last 168 hours.   Recent Results (from the past 240 hour(s))  Resp Panel by RT-PCR (Flu Korban Shearer&B, Covid) Nasopharyngeal Swab     Status: None   Collection Time: 01/31/2022  5:22 PM   Specimen: Nasopharyngeal Swab; Nasopharyngeal(NP) swabs in vial transport medium  Result Value Ref Range Status   SARS Coronavirus 2 by RT PCR NEGATIVE NEGATIVE Final    Comment: (NOTE) SARS-CoV-2 target nucleic acids are NOT  DETECTED.  The SARS-CoV-2 RNA is generally detectable in upper respiratory specimens during the acute phase of infection. The lowest concentration of SARS-CoV-2 viral copies this assay can detect is 138 copies/mL. Tammy Small negative result does not preclude SARS-Cov-2 infection and should not be used as the sole basis for treatment or other patient management decisions. Tammy Small negative result may occur with  improper specimen collection/handling, submission of specimen other than nasopharyngeal swab, presence of viral mutation(s) within the areas targeted by this assay, and inadequate number of viral copies(<138 copies/mL). Tammy Small negative result must be combined with clinical observations, patient history, and epidemiological information. The expected result is Negative.  Fact Sheet for Patients:  EntrepreneurPulse.com.au  Fact Sheet for Healthcare Providers:  IncredibleEmployment.be  This test is no t yet approved or cleared by the [place] FDA and  has been authorized for detection and/or diagnosis of SARS-CoV-2 by FDA under an Emergency Use Authorization (EUA). This EUA will remain  in effect (meaning this test can be used) for the duration of the COVID-19 declaration under Section 564(b)(1) of the Act, 21 U.S.C.section 360bbb-3(b)(1), unless the authorization is terminated  or revoked sooner.       Influenza Tammy Small by PCR NEGATIVE NEGATIVE Final   Influenza B by PCR NEGATIVE NEGATIVE Final    Comment: (NOTE) The Xpert Xpress SARS-CoV-2/FLU/RSV plus assay is intended as an aid in the diagnosis of influenza from Nasopharyngeal swab specimens and should not be used as Tammy Small sole basis for treatment. Nasal washings and aspirates are unacceptable for Xpert Xpress SARS-CoV-2/FLU/RSV testing.  Fact Sheet for Patients: EntrepreneurPulse.com.au  Fact Sheet for Healthcare Providers: IncredibleEmployment.be  This test is not yet  approved or cleared by the [place] FDA and has been authorized for detection and/or diagnosis of SARS-CoV-2 by FDA under an Emergency Use Authorization (EUA). This EUA will remain in effect (meaning this test can be used) for the duration of the COVID-19 declaration under Section 564(b)(1) of the Act, 21 U.S.C. section 360bbb-3(b)(1), unless the authorization is terminated or revoked.  Performed at [facility], [address] [place], [place] [address]   MRSA Next Gen by PCR, Nasal     Status: None   Collection Time: 01/06/2022 11:25 PM   Specimen: Nasal Mucosa; Nasal Swab  Result Value Ref Range Status   MRSA by PCR Next Gen NOT DETECTED NOT DETECTED Final    Comment: (NOTE) The GeneXpert MRSA Assay (FDA approved for NASAL specimens only), is one component of [facility] Robinette comprehensive MRSA colonization surveillance  program. It is not intended to diagnose MRSA infection nor to guide or monitor treatment for MRSA infections. Test performance is not FDA approved in patients less than 69 years old. Performed at [facility], [address] [place], [place] [address]          Radiology Studies: CT Angio Chest PE W/Cm &/Or Wo Cm  Addendum Date: 01/26/2022   ADDENDUM REPORT: 01/09/2022 19:24 ADDENDUM: Although reported in the findings section, an additional finding of potential significance was not included in the impression. There is possible right supraclavicular lymphadenopathy. Consider further evaluation with neck CT, ideally after the patient's acute shortness of breath has resolved. Electronically Signed   By: Richardean Sale M.D.   On: 01/14/2022 19:24   Result Date: 01/18/2022 CLINICAL DATA:  Shortness of breath with chest pain and pressure since yesterday. Clinical concern for pulmonary embolism. EXAM: CT ANGIOGRAPHY CHEST WITH CONTRAST TECHNIQUE: Multidetector CT imaging of the chest was performed using the standard protocol during bolus administration of intravenous contrast.  Multiplanar CT image reconstructions and MIPs were obtained to evaluate the vascular anatomy. RADIATION DOSE REDUCTION: This exam was performed according to the departmental dose-optimization program which includes automated exposure control, adjustment of the mA and/or kV according to patient size and/or use of iterative reconstruction technique. CONTRAST:  68mL OMNIPAQUE IOHEXOL 350 MG/ML SOLN COMPARISON:  Radiographs today and 11/22/2021. CT without contrast 11/02/2021. FINDINGS: Cardiovascular: The pulmonary arteries are well opacified with contrast to the level of the subsegmental branches. There is mildly heterogeneous enhancement within the lower lobe subsegmental branches, although no discrete intravascular filling defects are seen to suggest acute pulmonary embolism. There is atherosclerosis of the aorta, great vessels and coronary arteries. The heart is moderately enlarged. No significant pericardial fluid. Mediastinum/Nodes: There are no enlarged mediastinal, hilar or axillary lymph nodes.There are small mediastinal lymph nodes, and there is suspicion of an enlarged right supraclavicular node measuring 2.3 x 1.4 cm on image 11/4. The thyroid gland, trachea and esophagus demonstrate no significant findings. Lungs/Pleura: New moderate-sized dependent right pleural effusion with associated compressive right lower lobe atelectasis. Additional patchy airspace opacities are present within the right middle lobe and left lower lobe, probably atelectasis as well. Underlying centrilobular emphysema and central airway thickening are noted. Upper abdomen: Reflux of contrast into the IVC and hepatic veins. The visualized upper abdomen is otherwise unremarkable. Musculoskeletal/Chest wall: There is no chest wall mass or suspicious osseous finding. Mild thoracic spondylosis. Review of the MIP images confirms the above findings. IMPRESSION: 1. No evidence of acute pulmonary embolism. Peripheral branch assessment is  limited by breathing artifact and atelectasis. 2. Moderate size right pleural effusion with associated subtotal collapse of the right lower lobe. Additional patchy airspace opacities in the right middle and left lower lobes. Recommend radiographic follow-up. 3. Cardiomegaly with reflux of contrast into the IVC and hepatic veins suspicious for right heart failure. Coronary and Aortic Atherosclerosis (ICD10-I70.0). 4.  Emphysema (ICD10-J43.9). Electronically Signed: By: Richardean Sale M.D. On: 01/24/2022 19:07   DG Chest Port 1 View  Result Date: 01/11/2022 CLINICAL DATA:  Chest pain and shortness of breath. EXAM: PORTABLE CHEST 1 VIEW COMPARISON:  Chest radiograph 11/22/2021 FINDINGS: The cardiac silhouette remains moderately enlarged. The pericardial drain has been removed. Tammy Small small right pleural effusion is new or larger than on the prior study. There is associated right basilar atelectasis or consolidation. There is mild residual opacity in the left lung base which has improved from prior. Tammy Small trace left pleural effusion is  possible. No pneumothorax is identified. No acute osseous abnormality is seen. IMPRESSION: 1. Small right pleural effusion with right basilar atelectasis or consolidation. 2. Improved aeration of the left lung base. 3. Unchanged cardiomegaly. Electronically Signed   By: Logan Bores M.D.   On: 01/24/2022 16:05   ECHOCARDIOGRAM COMPLETE  Result Date: 01/13/2022    ECHOCARDIOGRAM REPORT   Patient Name:   Tammy Small Date of Exam: 01/13/2022 Medical Rec #:  [id number]    Height:       62.0 in Accession #:    [id number]   Weight:       199.7 lb Date of Birth:  1956/04/11   BSA:          1.911 m Patient Age:    30 years     BP:           124/76 mmHg Patient Gender: F            HR:           104 bpm. Exam Location:  [facility] Procedure: 2D Echo, Cardiac Doppler and Color Doppler Indications:    Acute Resipratory distress  History:        Patient has prior history of Echocardiogram examinations,  most                 recent 10/11/2021. CHF, Arrythmias:Atrial Fibrillation,                 Signs/Symptoms:Dyspnea; Risk Factors:Hypertension. S/P                 Pericardial window creation.  Sonographer:    Wenda Low Referring Phys: [place]  1. Pericardial effuson gone since window performed.  2. RV function much worse compared to TTE 10/11/21.  3. Left ventricular ejection fraction, by estimation, is 60 to 65%. The left ventricle has normal function. The left ventricle has no regional wall motion abnormalities. There is severe left ventricular hypertrophy. Left ventricular diastolic parameters  are indeterminate.  4. Right ventricular systolic function is severely reduced. The right ventricular size is severely enlarged. There is severely elevated pulmonary artery systolic pressure.  5. Left atrial size was moderately dilated.  6. Right atrial size was severely dilated.  7. The mitral valve is abnormal. No evidence of mitral valve regurgitation. No evidence of mitral stenosis.  8. The aortic valve is tricuspid. There is mild calcification of the aortic valve. Aortic valve regurgitation is not visualized. Aortic valve sclerosis is present, with no evidence of aortic valve stenosis.  9. The inferior vena cava is normal in size with greater than 50% respiratory variability, suggesting right atrial pressure of 3 mmHg. FINDINGS  Left Ventricle: Left ventricular ejection fraction, by estimation, is 60 to 65%. The left ventricle has normal function. The left ventricle has no regional wall motion abnormalities. The left ventricular internal cavity size was normal in size. There is  severe left ventricular hypertrophy. Left ventricular diastolic parameters are indeterminate. Right Ventricle: The right ventricular size is severely enlarged. No increase in right ventricular wall thickness. Right ventricular systolic function is severely reduced. There is severely elevated pulmonary artery  systolic pressure. The tricuspid regurgitant velocity is 3.38 m/s, and with an assumed right atrial pressure of 20 mmHg, the estimated right ventricular systolic pressure is [id number] mmHg. Left Atrium: Left atrial size was moderately dilated. Right Atrium: Right atrial size was severely dilated. Pericardium: There is no evidence of pericardial effusion. Mitral Valve: The mitral valve  is abnormal. There is mild thickening of the mitral valve leaflet(s). There is mild calcification of the mitral valve leaflet(s). Mild mitral annular calcification. No evidence of mitral valve regurgitation. No evidence of mitral valve stenosis. MV peak gradient, 2.8 mmHg. The mean mitral valve gradient is 1.0 mmHg. Tricuspid Valve: The tricuspid valve is normal in structure. Tricuspid valve regurgitation is mild . No evidence of tricuspid stenosis. Aortic Valve: The aortic valve is tricuspid. There is mild calcification of the aortic valve. Aortic valve regurgitation is not visualized. Aortic valve sclerosis is present, with no evidence of aortic valve stenosis. Pulmonic Valve: The pulmonic valve was normal in structure. Pulmonic valve regurgitation is mild. No evidence of pulmonic stenosis. Aorta: The aortic root is normal in size and structure. Venous: The inferior vena cava is normal in size with greater than 50% respiratory variability, suggesting right atrial pressure of 3 mmHg. IAS/Shunts: No atrial level shunt detected by color flow Doppler. Additional Comments: Pericardial effuson gone since window performed. RV function much worse compared to TTE 10/11/21.  LEFT VENTRICLE PLAX 2D LVIDd:         3.60 cm   Diastology LVIDs:         1.40 cm   LV e' medial:    5.11 cm/s LV PW:         1.90 cm   LV E/e' medial:  14.8 LV IVS:        1.70 cm   LV e' lateral:   5.66 cm/s LVOT diam:     2.00 cm   LV E/e' lateral: 13.4 LV SV:         40 LV SV Index:   21 LVOT Area:     3.14 cm  RIGHT VENTRICLE RV Basal diam:  4.45 cm RV Mid diam:    3.50 cm  RV S prime:     7.94 cm/s LEFT ATRIUM             Index        RIGHT ATRIUM           Index LA diam:        5.00 cm 2.62 cm/m   RA Area:     38.50 cm LA Vol (A2C):   83.5 ml 43.70 ml/m  RA Volume:   153.00 ml 80.08 ml/m LA Vol (A4C):   84.4 ml 44.17 ml/m LA Biplane Vol: 91.8 ml 48.05 ml/m  AORTIC VALVE             PULMONIC VALVE LVOT Vmax:   73.10 cm/s  PV Vmax:       0.56 m/s LVOT Vmean:  39.200 cm/s PV Peak grad:  1.2 mmHg LVOT VTI:    0.126 m  AORTA Ao Root diam: 3.10 cm Ao Asc diam:  3.30 cm MITRAL VALVE               TRICUSPID VALVE MV Area (PHT): 5.23 cm    TR Peak grad:   45.7 mmHg MV Area VTI:   3.30 cm    TR Vmax:        338.00 cm/s MV Peak grad:  2.8 mmHg MV Mean grad:  1.0 mmHg    SHUNTS MV Vmax:       0.83 m/s    Systemic VTI:  0.13 m MV Vmean:      49.7 cm/s   Systemic Diam: 2.00 cm MV Decel Time: 145 msec MV E velocity: 75.80 cm/s Tammy Rouge MD Electronically signed by  Tammy Rouge MD Signature Date/Time: 01/13/2022/11:35:59 AM    Final         Scheduled Meds:  apixaban  5 mg Oral BID   atorvastatin  20 mg Oral QPM   Chlorhexidine Gluconate Cloth  6 each Topical Daily   furosemide  40 mg Intravenous Q12H   guaiFENesin-dextromethorphan  10 mL Oral Q8H   levalbuterol  0.63 mg Nebulization Once   losartan  25 mg Oral Daily   metoprolol tartrate  25 mg Oral BID   Continuous Infusions:  azithromycin Stopped (01/17/2022 2350)   cefTRIAXone (ROCEPHIN)  IV Stopped (01/13/22 0504)     LOS: 1 day    Time spent: over 30 min    Fayrene Helper, MD [facility]   To contact the attending provider between 7A-7P or the covering provider during after hours 7P-7A, please log into the web site [email or web address] and access using universal [place] password for that web site. If you do not have the password, please call the hospital operator.  01/13/2022, 12:06 PM

## 2022-01-13 NOTE — Progress Notes (Signed)
PT tolerated right sided thoracentesis procedure well today and 700 mL of yellow slightly cloudy fluid removed with labs collected and sent for processing. PT verbalized understanding of post procedure instructions and had chest xray post procedure which was read by radiologist prior to patient being transported back to inpatient bed assignment with stretcher. PT transitioned back into inpatient bed where family is present at bedside. Vital signs stable at completion of procedure and no acute distress noted.  ?

## 2022-01-14 ENCOUNTER — Inpatient Hospital Stay (HOSPITAL_COMMUNITY): Payer: Self-pay

## 2022-01-14 DIAGNOSIS — I272 Pulmonary hypertension, unspecified: Secondary | ICD-10-CM

## 2022-01-14 DIAGNOSIS — I2609 Other pulmonary embolism with acute cor pulmonale: Secondary | ICD-10-CM

## 2022-01-14 DIAGNOSIS — I4819 Other persistent atrial fibrillation: Secondary | ICD-10-CM

## 2022-01-14 DIAGNOSIS — N179 Acute kidney failure, unspecified: Secondary | ICD-10-CM

## 2022-01-14 DIAGNOSIS — J918 Pleural effusion in other conditions classified elsewhere: Secondary | ICD-10-CM

## 2022-01-14 DIAGNOSIS — I959 Hypotension, unspecified: Secondary | ICD-10-CM

## 2022-01-14 DIAGNOSIS — R17 Unspecified jaundice: Secondary | ICD-10-CM

## 2022-01-14 LAB — CBC WITH DIFFERENTIAL/PLATELET
Abs Immature Granulocytes: 0.06 10*3/uL (ref 0.00–0.07)
Basophils Absolute: 0 10*3/uL (ref 0.0–0.1)
Basophils Relative: 0 %
Eosinophils Absolute: 0 10*3/uL (ref 0.0–0.5)
Eosinophils Relative: 0 %
HCT: 45.6 % (ref 36.0–46.0)
Hemoglobin: 13.9 g/dL (ref 12.0–15.0)
Immature Granulocytes: 1 %
Lymphocytes Relative: 14 %
Lymphs Abs: 1.5 10*3/uL (ref 0.7–4.0)
MCH: 30.5 pg (ref 26.0–34.0)
MCHC: 30.5 g/dL (ref 30.0–36.0)
MCV: 100 fL (ref 80.0–100.0)
Monocytes Absolute: 2.1 10*3/uL — ABNORMAL HIGH (ref 0.1–1.0)
Monocytes Relative: 20 %
Neutro Abs: 7 10*3/uL (ref 1.7–7.7)
Neutrophils Relative %: 65 %
Platelets: 205 10*3/uL (ref 150–400)
RBC: 4.56 MIL/uL (ref 3.87–5.11)
RDW: 14.6 % (ref 11.5–15.5)
WBC: 10.7 10*3/uL — ABNORMAL HIGH (ref 4.0–10.5)
nRBC: 0 % (ref 0.0–0.2)

## 2022-01-14 LAB — COMPREHENSIVE METABOLIC PANEL
ALT: 8 U/L (ref 0–44)
AST: 10 U/L — ABNORMAL LOW (ref 15–41)
Albumin: 3 g/dL — ABNORMAL LOW (ref 3.5–5.0)
Alkaline Phosphatase: 68 U/L (ref 38–126)
Anion gap: 10 (ref 5–15)
BUN: 23 mg/dL (ref 8–23)
CO2: 23 mmol/L (ref 22–32)
Calcium: 8.4 mg/dL — ABNORMAL LOW (ref 8.9–10.3)
Chloride: 105 mmol/L (ref 98–111)
Creatinine, Ser: 1.35 mg/dL — ABNORMAL HIGH (ref 0.44–1.00)
GFR, Estimated: 44 mL/min — ABNORMAL LOW (ref >60)
Glucose, Bld: 102 mg/dL — ABNORMAL HIGH (ref 70–99)
Potassium: 4.4 mmol/L (ref 3.5–5.1)
Sodium: 138 mmol/L (ref 135–145)
Total Bilirubin: 1.9 mg/dL — ABNORMAL HIGH (ref 0.3–1.2)
Total Protein: 6.1 g/dL — ABNORMAL LOW (ref 6.5–8.1)

## 2022-01-14 LAB — URINALYSIS, COMPLETE (UACMP) WITH MICROSCOPIC
Bilirubin Urine: NEGATIVE
Glucose, UA: NEGATIVE mg/dL
Hgb urine dipstick: NEGATIVE
Ketones, ur: 5 mg/dL — AB
Leukocytes,Ua: NEGATIVE
Nitrite: NEGATIVE
Protein, ur: 30 mg/dL — AB
Specific Gravity, Urine: 1.035 — ABNORMAL HIGH (ref 1.005–1.030)
pH: 5 (ref 5.0–8.0)

## 2022-01-14 LAB — MAGNESIUM: Magnesium: 1.8 mg/dL (ref 1.7–2.4)

## 2022-01-14 LAB — GLUCOSE, CAPILLARY
Glucose-Capillary: 119 mg/dL — ABNORMAL HIGH (ref 70–99)
Glucose-Capillary: 97 mg/dL (ref 70–99)
Glucose-Capillary: 104 mg/dL — ABNORMAL HIGH (ref 70–99)
Glucose-Capillary: 94 mg/dL (ref 70–99)

## 2022-01-14 LAB — LEGIONELLA PNEUMOPHILA SEROGP 1 UR AG: L. pneumophila Serogp 1 Ur Ag: NEGATIVE

## 2022-01-14 LAB — C-REACTIVE PROTEIN: CRP: 26.1 mg/dL — ABNORMAL HIGH (ref <1.0)

## 2022-01-14 LAB — BILIRUBIN, FRACTIONATED(TOT/DIR/INDIR)
Bilirubin, Direct: 0.4 mg/dL — ABNORMAL HIGH (ref 0.0–0.2)
Indirect Bilirubin: 1.6 mg/dL — ABNORMAL HIGH (ref 0.3–0.9)
Total Bilirubin: 2 mg/dL — ABNORMAL HIGH (ref 0.3–1.2)

## 2022-01-14 LAB — PROCALCITONIN: Procalcitonin: 0.4 ng/mL

## 2022-01-14 LAB — PHOSPHORUS: Phosphorus: 4.5 mg/dL (ref 2.5–4.6)

## 2022-01-14 LAB — BRAIN NATRIURETIC PEPTIDE: B Natriuretic Peptide: 620 pg/mL — ABNORMAL HIGH (ref 0.0–100.0)

## 2022-01-14 MED ORDER — METOPROLOL TARTRATE 25 MG PO TABS
12.5000 mg | ORAL_TABLET | Freq: Two times a day (BID) | ORAL | Status: DC
  Administered 2022-01-15: 12.5 mg via ORAL
  Filled 2022-01-14 (×2): qty 1

## 2022-01-14 MED ORDER — SODIUM CHLORIDE 0.9 % IV SOLN: 250.0000 mL | INTRAVENOUS | Status: DC

## 2022-01-14 MED ORDER — SODIUM CHLORIDE 0.9 % IV BOLUS
500.0000 mL | Freq: Once | INTRAVENOUS | Status: AC
  Administered 2022-01-14: 500 mL via INTRAVENOUS

## 2022-01-14 MED ORDER — NOREPINEPHRINE 4 MG/250ML-% IV SOLN: 2.0000 ug/min | INTRAVENOUS | Status: DC

## 2022-01-14 MED ORDER — INSULIN ASPART 100 UNIT/ML IJ SOLN: 0.0000 [IU] | Freq: Three times a day (TID) | INTRAMUSCULAR | Status: DC

## 2022-01-14 MED ORDER — METOPROLOL TARTRATE 25 MG PO TABS
12.5000 mg | ORAL_TABLET | Freq: Two times a day (BID) | ORAL | Status: DC
Start: 2022-01-14 — End: 2022-01-14

## 2022-01-14 MED ORDER — MIDODRINE HCL 5 MG PO TABS
5.0000 mg | ORAL_TABLET | Freq: Three times a day (TID) | ORAL | Status: DC
  Administered 2022-01-14 – 2022-01-16 (×6): 5 mg via ORAL
  Filled 2022-01-14 (×6): qty 1

## 2022-01-14 NOTE — Progress Notes (Signed)
PROGRESS NOTE    Tammy Small  [id number] DOB: Mar 24, 1956 DOA: 01/06/2022 PCP: Soyla Dryer, PA-C  Chief Complaint  Patient presents with   Chest Pain    Brief Narrative:  66 yo with hx HFpEF, pericardial effusion now s/p pericardial window 11/2021, atrial fibrillation, HTN, T2DM who presented with chest pain and shortness of breath, found to have right sided pleural effusion with patchy airspace opacities.  Also concern for heart failure exacerbation.  She's been admitted for acute hypoxic respiratory failure, being treated for pneumonia, pleural effusion, and HF exacerbation.  See below for additional details    Assessment & Plan:   Principal Problem:   Acute respiratory failure with hypoxia ([place]) Active Problems:   Pneumonia   Exudative pleural effusion   Acute exacerbation of CHF (congestive heart failure) (HCC)   Nausea & vomiting   Hypotension   AKI (acute kidney injury) ([place])   Paroxysmal Louann Hopson-fib (HCC)   Supraclavicular lymphadenopathy   Total bilirubin, elevated   S/P Xi Robotic assisted Video Assisted Left Pericardial Window Creation   HTN (hypertension)   Type 2 diabetes mellitus (HCC)   Assessment and Plan: * Acute respiratory failure with hypoxia (HCC) Likely secondary to pneumonia and moderate pleural effusion with subtotal lower lobe collapse.  Possible contribution from HF. It's been difficult to monitor sats, poor quality pleth, rotating sites for pulse ox, currently she's on 12 L [place], but symptomatically feeling better since thoracentesis. CXR 3/10 with enlarged cardiac silhouette, L basilar airspace opacity, improved R effusion See below   Exudative pleural effusion CT with moderate R effusion Exudative by lights criteria.  Follow thoracentesis and thora labs -> gram stain without organisms, culture pending, cell count 31,800 (78% neutrophils).  PH pending, cytology pending. Concerning for parapneumonic  Pneumonia Mild leukocytosis, afebrile   CT chest with moderate R pleural effusion with subtotal collapse of RLL and patchy airspace opacities in right middle and left lower lobes Continue CAP coverage with ceftriaxone and azithromycin (3/8-present) Urine strep negative, legionella pending, sputum cx pending collection Negative MRSA pcr procalcitonin not particularly impressive No blood cx collected  AKI (acute kidney injury) ([place]) Related to hypotension, diuresis Holding losartan, lasix - s/p bolus Will trend Follow UA  Hypotension S/p bolus overnight Currently in the [id number] systolic (as low as 0000000 systolic overnight) Holding losartan and lasix as well as metoprolol. Opiates contributing, will hold with improved pain Will continue to monitor  Nausea & vomiting Consider additional imaging Antiemetics ordered  Acute exacerbation of CHF (congestive heart failure) (HCC) CTA chest showed moderate pleural effusion with associated subtotal left lung collapse.  Also cardiomegaly with reflux of contrast into IVC and hepatic veins. BNP elevated at 963 (1049 in 09/2021).  Troponin 23 > 2.  EKG shows atrial fibrillation.  Echo 10/2022 shows EF of 70 to 75%.  Reports compliance with 40 mg Lasix daily. Echo 01/13/2022 with EF 60-65%, no RWMA, severe LVH, severely reduced RVSF, severely elevated PASP Hold diuretics for now with low BP's overnight Strict I/O, daily weights Will consult cardiology   Total bilirubin, elevated Hemodynamically mediated? Related to right heart failure? Fractionate, improved today Consider additional w/u as indicated.  Supraclavicular lymphadenopathy Incidental CT findings - there are small mediastinal lymph nodes, and there is suspicion of an enlarged right supraclavicular node measuring 2.3 x 1.4 cm.   Radiology recommendation- consider further evaluation with neck CT, ideally after the patient's acute shortness of breath has resolved.  Paroxysmal Deshun Sedivy-fib (HCC) Currently in atrial fibrillation.  Rate  controlled, and on anticoagulation reports compliance with Eliquis. Continue Eliquis and hold metoprolol with BP  S/P Xi Robotic assisted Video Assisted Left Pericardial Window Creation Admitted 1/11 to 1/17, underwent Xi robotic assisted right thoracoscopy, pericardial window on 1/11.  Pericardial biopsy showed mild chronic inflammation and focal fibrosis.  No malignancy or granulomas.  She followed up postdischarge with CTS- Dr. Kipp Brood, 1/20.  Patient was doing well.   - Subsequently followed-up with Dr. Conni Elliot 2/7, plan was to obtain limited echo for reevaluation and new baseline.  This has not been done.  -Obtain echo (as above)  HTN (hypertension) Stable. Hold losartan  Hold metoprolol  Type 2 diabetes mellitus (HCC) SSI   DVT prophylaxis: eliquis Code Status: full Family Communication: none Disposition:   Status is: Inpatient Remains inpatient appropriate because: need for IV diuresis, cardiology c/s, IV abx   Consultants:  Cardiology IR  Procedures:  Echo IMPRESSIONS     1. Pericardial effuson gone since window performed.   2. RV function much worse compared to TTE 10/11/21.   3. Left ventricular ejection fraction, by estimation, is 60 to 65%. The  left ventricle has normal function. The left ventricle has no regional  wall motion abnormalities. There is severe left ventricular hypertrophy.  Left ventricular diastolic parameters   are indeterminate.   4. Right ventricular systolic function is severely reduced. The right  ventricular size is severely enlarged. There is severely elevated  pulmonary artery systolic pressure.   5. Left atrial size was moderately dilated.   6. Right atrial size was severely dilated.   7. The mitral valve is abnormal. No evidence of mitral valve  regurgitation. No evidence of mitral stenosis.   8. The aortic valve is tricuspid. There is mild calcification of the  aortic valve. Aortic valve regurgitation is not visualized. Aortic  valve  sclerosis is present, with no evidence of aortic valve stenosis.   9. The inferior vena cava is normal in size with greater than 50%  respiratory variability, suggesting right atrial pressure of 3 mmHg.   Antimicrobials:  Anti-infectives (From admission, onward)    Start     Dose/Rate Route Frequency Ordered Stop   01/13/22 0500  cefTRIAXone (ROCEPHIN) 2 g in sodium chloride 0.9 % 100 mL IVPB        2 g 200 mL/hr over 30 Minutes Intravenous Every 24 hours 01/21/2022 2104     01/11/2022 2115  azithromycin (ZITHROMAX) 500 mg in sodium chloride 0.9 % 250 mL IVPB        500 mg 250 mL/hr over 60 Minutes Intravenous Every 24 hours 01/05/2022 2104     01/31/2022 2030  vancomycin (VANCOREADY) IVPB 1750 mg/350 mL  Status:  Discontinued        1,750 mg 175 mL/hr over 120 Minutes Intravenous  Once 01/25/2022 1955 01/29/2022 2104   01/18/2022 1930  ceFEPIme (MAXIPIME) 2 g in sodium chloride 0.9 % 100 mL IVPB        2 g 200 mL/hr over 30 Minutes Intravenous  Once 01/14/2022 1927 01/15/2022 2031       Subjective: Feels better Pain improved, nausea improved - felt better after thora  Objective: Vitals:   01/14/22 0615 01/14/22 0630 01/14/22 0700 01/14/22 0800  BP: (!) 95/53 (!) 80/62 91/68 (!) 83/59  Pulse:    98  Resp: 17 19 18 18   Temp:   98 F (36.7 C)   TempSrc:   Axillary   SpO2:    97%  Weight:  Height:        Intake/Output Summary (Last 24 hours) at 01/14/2022 0903 Last data filed at 01/14/2022 0700 Gross per 24 hour  Intake 351.83 ml  Output 150 ml  Net 201.83 ml   Filed Weights   01/15/2022 2337 01/13/22 0438 01/14/22 0456  Weight: 90.5 kg 90.6 kg 92 kg    Examination:  General: still appears tired, but less ill appearing today Cardiovascular: irregularly irregular Lungs: L basilar rales, diminished on R Abdomen: Soft, nontender, nondistended  Neurological: Alert and oriented 3. Moves all extremities 4 . Cranial nerves II through XII grossly intact. Skin: Warm and dry.  No rashes or lesions. Extremities: nonpitting edema to LE, dependent sacral edema   Data Reviewed: I have personally reviewed following labs and imaging studies  CBC: Recent Labs  Lab 01/31/2022 1549 01/13/22 0424 01/14/22 0424  WBC 7.8 10.7* 10.7*  NEUTROABS 5.0  --  7.0  HGB 16.1* 14.8 13.9  HCT 50.8* 46.9* 45.6  MCV 95.1 98.5 100.0  PLT 232 205 [id number]    Basic Metabolic Panel: Recent Labs  Lab 01/29/2022 1549 01/13/22 0424 01/14/22 0424  NA 140 137 138  K 3.7 4.3 4.4  CL 103 105 105  CO2 25 21* 23  GLUCOSE 175* 129* 102*  BUN 13 17 23   CREATININE 0.91 0.99 1.35*  CALCIUM 9.2 8.5* 8.4*  MG  --   --  1.8  PHOS  --   --  4.5    GFR: Estimated Creatinine Clearance: 44.5 mL/min (Bleu Moisan) (by C-G formula based on SCr of 1.35 mg/dL (H)).  Liver Function Tests: Recent Labs  Lab 01/18/2022 1549 01/14/22 0424  AST 16 10*  ALT 14 8  ALKPHOS 91 68  BILITOT 3.9* 1.9*  PROT 7.4 6.1*  ALBUMIN 4.1 3.0*    CBG: No results for input(s): GLUCAP in the last 168 hours.   Recent Results (from the past 240 hour(s))  Resp Panel by RT-PCR (Flu Nicholas Trompeter&B, Covid) Nasopharyngeal Swab     Status: None   Collection Time: 01/30/2022  5:22 PM   Specimen: Nasopharyngeal Swab; Nasopharyngeal(NP) swabs in vial transport medium  Result Value Ref Range Status   SARS Coronavirus 2 by RT PCR NEGATIVE NEGATIVE Final    Comment: (NOTE) SARS-CoV-2 target nucleic acids are NOT DETECTED.  The SARS-CoV-2 RNA is generally detectable in upper respiratory specimens during the acute phase of infection. The lowest concentration of SARS-CoV-2 viral copies this assay can detect is 138 copies/mL. Xzavier Swinger negative result does not preclude SARS-Cov-2 infection and should not be used as the sole basis for treatment or other patient management decisions. Jeanett Antonopoulos negative result may occur with  improper specimen collection/handling, submission of specimen other than nasopharyngeal swab, presence of viral mutation(s) within  the areas targeted by this assay, and inadequate number of viral copies(<138 copies/mL). Eevee Borbon negative result must be combined with clinical observations, patient history, and epidemiological information. The expected result is Negative.  Fact Sheet for Patients:  EntrepreneurPulse.com.au  Fact Sheet for Healthcare Providers:  IncredibleEmployment.be  This test is no t yet approved or cleared by the [place] FDA and  has been authorized for detection and/or diagnosis of SARS-CoV-2 by FDA under an Emergency Use Authorization (EUA). This EUA will remain  in effect (meaning this test can be used) for the duration of the COVID-19 declaration under Section 564(b)(1) of the Act, 21 U.S.C.section 360bbb-3(b)(1), unless the authorization is terminated  or revoked sooner.       Influenza Joselyn Edling  by PCR NEGATIVE NEGATIVE Final   Influenza B by PCR NEGATIVE NEGATIVE Final    Comment: (NOTE) The Xpert Xpress SARS-CoV-2/FLU/RSV plus assay is intended as an aid in the diagnosis of influenza from Nasopharyngeal swab specimens and should not be used as Rohaan Durnil sole basis for treatment. Nasal washings and aspirates are unacceptable for Xpert Xpress SARS-CoV-2/FLU/RSV testing.  Fact Sheet for Patients: EntrepreneurPulse.com.au  Fact Sheet for Healthcare Providers: IncredibleEmployment.be  This test is not yet approved or cleared by the [place] FDA and has been authorized for detection and/or diagnosis of SARS-CoV-2 by FDA under an Emergency Use Authorization (EUA). This EUA will remain in effect (meaning this test can be used) for the duration of the COVID-19 declaration under Section 564(b)(1) of the Act, 21 U.S.C. section 360bbb-3(b)(1), unless the authorization is terminated or revoked.  Performed at [facility], [address] [place], [place] [address]   MRSA Next Gen by PCR, Nasal     Status: None   Collection  Time: 01/23/2022 11:25 PM   Specimen: Nasal Mucosa; Nasal Swab  Result Value Ref Range Status   MRSA by PCR Next Gen NOT DETECTED NOT DETECTED Final    Comment: (NOTE) The GeneXpert MRSA Assay (FDA approved for NASAL specimens only), is one component of [place] Lenig comprehensive MRSA colonization surveillance program. It is not intended to diagnose MRSA infection nor to guide or monitor treatment for MRSA infections. Test performance is not FDA approved in patients less than 30 years old. Performed at [facility], [address], [place], [place] [address]   Gram stain     Status: None   Collection Time: 01/13/22 12:00 PM   Specimen: Pleura  Result Value Ref Range Status   Specimen Description PLEURAL RIGHT  Final   Special Requests NONE  Final   Gram Stain   Final    NO ORGANISMS SEEN WBC PRESENT,BOTH PMN AND MONONUCLEAR CYTOSPIN SMEAR Performed at [facility], [address] [place], [place] [address]    Report Status 01/13/2022 FINAL  Final  Culture, body fluid w Gram Stain-bottle     Status: None (Preliminary result)   Collection Time: 01/13/22 12:00 PM   Specimen: Pleura  Result Value Ref Range Status   Specimen Description PLEURAL RIGHT  Final   Special Requests   Final    BOTTLES DRAWN AEROBIC AND ANAEROBIC Blood Culture adequate volume   Culture   Final    NO GROWTH < 24 HOURS Performed at [facility], [address], [place], [place] [address]    Report Status PENDING  Incomplete         Radiology Studies: DG Chest 1 View  Result Date: 01/13/2022 CLINICAL DATA:  Post RIGHT thoracentesis EXAM: CHEST  1 VIEW COMPARISON:  01/16/2022 FINDINGS: Enlargement of cardiac silhouette. Mild atelectasis versus infiltrate at LEFT base. Significantly decreased RIGHT pleural effusion post thoracentesis. No pneumothorax. Atherosclerotic calcification aorta. IMPRESSION: No pneumothorax following RIGHT thoracentesis. Enlargement of cardiac silhouette with mild persistent atelectasis or  infiltrate at LEFT base. Electronically Signed   By: Lavonia Dana M.D.   On: 01/13/2022 12:44   CT Angio Chest PE W/Cm &/Or Wo Cm  Addendum Date: 01/05/2022   ADDENDUM REPORT: 01/27/2022 19:24 ADDENDUM: Although reported in the findings section, an additional finding of potential significance was not included in the impression. There is possible right supraclavicular lymphadenopathy. Consider further evaluation with neck CT, ideally after the patient's acute shortness of breath has resolved. Electronically Signed   By: Richardean Sale  M.D.   On: 01/23/2022 19:24   Result Date: 01/30/2022 CLINICAL DATA:  Shortness of breath with chest pain and pressure since yesterday. Clinical concern for pulmonary embolism. EXAM: CT ANGIOGRAPHY CHEST WITH CONTRAST TECHNIQUE: Multidetector CT imaging of the chest was performed using the standard protocol during bolus administration of intravenous contrast. Multiplanar CT image reconstructions and MIPs were obtained to evaluate the vascular anatomy. RADIATION DOSE REDUCTION: This exam was performed according to the departmental dose-optimization program which includes automated exposure control, adjustment of the mA and/or kV according to patient size and/or use of iterative reconstruction technique. CONTRAST:  48mL OMNIPAQUE IOHEXOL 350 MG/ML SOLN COMPARISON:  Radiographs today and 11/22/2021. CT without contrast 11/02/2021. FINDINGS: Cardiovascular: The pulmonary arteries are well opacified with contrast to the level of the subsegmental branches. There is mildly heterogeneous enhancement within the lower lobe subsegmental branches, although no discrete intravascular filling defects are seen to suggest acute pulmonary embolism. There is atherosclerosis of the aorta, great vessels and coronary arteries. The heart is moderately enlarged. No significant pericardial fluid. Mediastinum/Nodes: There are no enlarged mediastinal, hilar or axillary lymph nodes.There are small mediastinal  lymph nodes, and there is suspicion of an enlarged right supraclavicular node measuring 2.3 x 1.4 cm on image 11/4. The thyroid gland, trachea and esophagus demonstrate no significant findings. Lungs/Pleura: New moderate-sized dependent right pleural effusion with associated compressive right lower lobe atelectasis. Additional patchy airspace opacities are present within the right middle lobe and left lower lobe, probably atelectasis as well. Underlying centrilobular emphysema and central airway thickening are noted. Upper abdomen: Reflux of contrast into the IVC and hepatic veins. The visualized upper abdomen is otherwise unremarkable. Musculoskeletal/Chest wall: There is no chest wall mass or suspicious osseous finding. Mild thoracic spondylosis. Review of the MIP images confirms the above findings. IMPRESSION: 1. No evidence of acute pulmonary embolism. Peripheral branch assessment is limited by breathing artifact and atelectasis. 2. Moderate size right pleural effusion with associated subtotal collapse of the right lower lobe. Additional patchy airspace opacities in the right middle and left lower lobes. Recommend radiographic follow-up. 3. Cardiomegaly with reflux of contrast into the IVC and hepatic veins suspicious for right heart failure. Coronary and Aortic Atherosclerosis (ICD10-I70.0). 4.  Emphysema (ICD10-J43.9). Electronically Signed: By: Richardean Sale M.D. On: 01/28/2022 19:07   DG CHEST PORT 1 VIEW  Result Date: 01/14/2022 CLINICAL DATA:  Chest pain starting 2 days ago. Midsternal. Shortness of breath and cough. EXAM: PORTABLE CHEST 1 VIEW COMPARISON:  01/13/2022 AP chest, AP chest and CT chest 01/15/2022 FINDINGS: Cardiac silhouette is again moderately to markedly enlarged. Mediastinal contours are unchanged. Calcification is again seen within aortic arch. There is again an epicardial fat pad overlying the left inferolateral hemithorax. No definite pleural effusion is seen on frontal view.  Probable left basilar heterogeneous opacification. No pneumothorax. No acute skeletal abnormality. IMPRESSION:: IMPRESSION: 1. Moderately to markedly enlarged cardiac silhouette. 2. Left basilar heterogeneous airspace opacity, atelectasis versus pneumonia. 3. Again marked improvement in the pleural effusion seen on 01/26/2022 following 01/13/2022 thoracentesis. Electronically Signed   By: Yvonne Kendall M.D.   On: 01/14/2022 08:10   DG Chest Port 1 View  Result Date: 01/05/2022 CLINICAL DATA:  Chest pain and shortness of breath. EXAM: PORTABLE CHEST 1 VIEW COMPARISON:  Chest radiograph 11/22/2021 FINDINGS: The cardiac silhouette remains moderately enlarged. The pericardial drain has been removed. Meliya Mcconahy small right pleural effusion is new or larger than on the prior study. There is associated right basilar atelectasis or consolidation. There  is mild residual opacity in the left lung base which has improved from prior. Margeaux Swantek trace left pleural effusion is possible. No pneumothorax is identified. No acute osseous abnormality is seen. IMPRESSION: 1. Small right pleural effusion with right basilar atelectasis or consolidation. 2. Improved aeration of the left lung base. 3. Unchanged cardiomegaly. Electronically Signed   By: Logan Bores M.D.   On: 01/11/2022 16:05   ECHOCARDIOGRAM COMPLETE  Result Date: 01/13/2022    ECHOCARDIOGRAM REPORT   Patient Name:   HAILEYMARIE LYE Date of Exam: 01/13/2022 Medical Rec #:  [id number]    Height:       62.0 in Accession #:    [id number]   Weight:       199.7 lb Date of Birth:  [birth year]   BSA:          1.911 m Patient Age:    11 years     BP:           124/76 mmHg Patient Gender: F            HR:           104 bpm. Exam Location:  [facility] Procedure: 2D Echo, Cardiac Doppler and Color Doppler Indications:    Acute Resipratory distress  History:        Patient has prior history of Echocardiogram examinations, most                 recent 10/11/2021. CHF, Arrythmias:Atrial Fibrillation,                  Signs/Symptoms:Dyspnea; Risk Factors:Hypertension. S/P                 Pericardial window creation.  Sonographer:    Wenda Low Referring Phys: [place]  1. Pericardial effuson gone since window performed.  2. RV function much worse compared to TTE 10/11/21.  3. Left ventricular ejection fraction, by estimation, is 60 to 65%. The left ventricle has normal function. The left ventricle has no regional wall motion abnormalities. There is severe left ventricular hypertrophy. Left ventricular diastolic parameters  are indeterminate.  4. Right ventricular systolic function is severely reduced. The right ventricular size is severely enlarged. There is severely elevated pulmonary artery systolic pressure.  5. Left atrial size was moderately dilated.  6. Right atrial size was severely dilated.  7. The mitral valve is abnormal. No evidence of mitral valve regurgitation. No evidence of mitral stenosis.  8. The aortic valve is tricuspid. There is mild calcification of the aortic valve. Aortic valve regurgitation is not visualized. Aortic valve sclerosis is present, with no evidence of aortic valve stenosis.  9. The inferior vena cava is normal in size with greater than 50% respiratory variability, suggesting right atrial pressure of 3 mmHg. FINDINGS  Left Ventricle: Left ventricular ejection fraction, by estimation, is 60 to 65%. The left ventricle has normal function. The left ventricle has no regional wall motion abnormalities. The left ventricular internal cavity size was normal in size. There is  severe left ventricular hypertrophy. Left ventricular diastolic parameters are indeterminate. Right Ventricle: The right ventricular size is severely enlarged. No increase in right ventricular wall thickness. Right ventricular systolic function is severely reduced. There is severely elevated pulmonary artery systolic pressure. The tricuspid regurgitant velocity is 3.38 m/s, and with an  assumed right atrial pressure of 20 mmHg, the estimated right ventricular systolic pressure is [id number] mmHg. Left Atrium: Left atrial size was moderately dilated. Right  Atrium: Right atrial size was severely dilated. Pericardium: There is no evidence of pericardial effusion. Mitral Valve: The mitral valve is abnormal. There is mild thickening of the mitral valve leaflet(s). There is mild calcification of the mitral valve leaflet(s). Mild mitral annular calcification. No evidence of mitral valve regurgitation. No evidence of mitral valve stenosis. MV peak gradient, 2.8 mmHg. The mean mitral valve gradient is 1.0 mmHg. Tricuspid Valve: The tricuspid valve is normal in structure. Tricuspid valve regurgitation is mild . No evidence of tricuspid stenosis. Aortic Valve: The aortic valve is tricuspid. There is mild calcification of the aortic valve. Aortic valve regurgitation is not visualized. Aortic valve sclerosis is present, with no evidence of aortic valve stenosis. Pulmonic Valve: The pulmonic valve was normal in structure. Pulmonic valve regurgitation is mild. No evidence of pulmonic stenosis. Aorta: The aortic root is normal in size and structure. Venous: The inferior vena cava is normal in size with greater than 50% respiratory variability, suggesting right atrial pressure of 3 mmHg. IAS/Shunts: No atrial level shunt detected by color flow Doppler. Additional Comments: Pericardial effuson gone since window performed. RV function much worse compared to TTE 10/11/21.  LEFT VENTRICLE PLAX 2D LVIDd:         3.60 cm   Diastology LVIDs:         1.40 cm   LV e' medial:    5.11 cm/s LV PW:         1.90 cm   LV E/e' medial:  14.8 LV IVS:        1.70 cm   LV e' lateral:   5.66 cm/s LVOT diam:     2.00 cm   LV E/e' lateral: 13.4 LV SV:         40 LV SV Index:   21 LVOT Area:     3.14 cm  RIGHT VENTRICLE RV Basal diam:  4.45 cm RV Mid diam:    3.50 cm RV S prime:     7.94 cm/s LEFT ATRIUM             Index        RIGHT ATRIUM            Index LA diam:        5.00 cm 2.62 cm/m   RA Area:     38.50 cm LA Vol (A2C):   83.5 ml 43.70 ml/m  RA Volume:   153.00 ml 80.08 ml/m LA Vol (A4C):   84.4 ml 44.17 ml/m LA Biplane Vol: 91.8 ml 48.05 ml/m  AORTIC VALVE             PULMONIC VALVE LVOT Vmax:   73.10 cm/s  PV Vmax:       0.56 m/s LVOT Vmean:  39.200 cm/s PV Peak grad:  1.2 mmHg LVOT VTI:    0.126 m  AORTA Ao Root diam: 3.10 cm Ao Asc diam:  3.30 cm MITRAL VALVE               TRICUSPID VALVE MV Area (PHT): 5.23 cm    TR Peak grad:   45.7 mmHg MV Area VTI:   3.30 cm    TR Vmax:        338.00 cm/s MV Peak grad:  2.8 mmHg MV Mean grad:  1.0 mmHg    SHUNTS MV Vmax:       0.83 m/s    Systemic VTI:  0.13 m MV Vmean:      49.7 cm/s  Systemic Diam: 2.00 cm MV Decel Time: 145 msec MV E velocity: 75.80 cm/s Jenkins Rouge MD Electronically signed by Jenkins Rouge MD Signature Date/Time: 01/13/2022/11:35:59 AM    Final    US THORACENTESIS ASP PLEURAL SPACE W/IMG GUIDE  Result Date: 01/13/2022 INDICATION: RIGHT pleural effusion EXAM: ULTRASOUND GUIDED DIAGNOSTIC AND THERAPEUTIC RIGHT THORACENTESIS MEDICATIONS: None. COMPLICATIONS: None immediate. PROCEDURE: An ultrasound guided thoracentesis was thoroughly discussed with the patient and questions answered. The benefits, risks, alternatives and complications were also discussed. The patient understands and wishes to proceed with the procedure. Written consent was obtained. Ultrasound was performed to localize and mark an adequate pocket of fluid in the RIGHT chest. The area was then prepped and draped in the normal sterile fashion. 1% Lidocaine was used for local anesthesia. Under ultrasound guidance Daire Okimoto 8 French thoracentesis catheter was introduced. Thoracentesis was performed. The catheter was removed and Yaquelin Langelier dressing applied. FINDINGS: Ileigh Mettler total of approximately 700 mL of dark yellow RIGHT pleural fluid was removed. Samples were sent to the laboratory as requested by the clinical team. IMPRESSION:  Successful ultrasound guided RIGHT thoracentesis yielding 700 ML for high flow foot for of pleural fluid. Electronically Signed   By: Lavonia Dana M.D.   On: 01/13/2022 12:43        Scheduled Meds:  apixaban  5 mg Oral BID   atorvastatin  20 mg Oral QPM   Chlorhexidine Gluconate Cloth  6 each Topical Daily   guaiFENesin-dextromethorphan  10 mL Oral Q8H   insulin aspart  0-6 Units Subcutaneous TID WC   levalbuterol  0.63 mg Nebulization Once   Continuous Infusions:  azithromycin Stopped (01/13/22 2225)   cefTRIAXone (ROCEPHIN)  IV Stopped (01/14/22 0556)     LOS: 2 days    Time spent: over 30 min    Fayrene Helper, MD [facility]   To contact the attending provider between 7A-7P or the covering provider during after hours 7P-7A, please log into the web site [email or web address] and access using universal [place] password for that web site. If you do not have the password, please call the hospital operator.  01/14/2022, 9:03 AM

## 2022-01-14 NOTE — Assessment & Plan Note (Addendum)
Currently in the 90's systolic (as low as 60's systolic overnight) ?Holding losartan and lasix as well as metoprolol. ?Opiates contributing, will hold with improved pain ?Started on midodrine yesterday afternoon ?On 3 mcg/min levophed at this time with worsening hypotension again overnight - Wean for SBP>90, map>65.  Will plan for picc. ?

## 2022-01-14 NOTE — Consult Note (Signed)
Cardiology Consultation:   Patient ID: Tammy Small; [id number]; 01/01/1956   Admit date: February 05, 2022 Date of Consult: 01/14/2022  Primary Care Provider: Jacquelin Hawking, PA-C Primary Cardiologist: Nona Dell, MD Primary Electrophysiologist: None   Patient Profile:   Tammy Small is a 66 y.o. female with a history of paroxysmal atrial fibrillation, hypertension, type 2 diabetes mellitus, HFpEF, pulmonary hypertension with chronic hypoxic respiratory failure, and pericardial effusion status post pericardial window in January who is being seen today for the evaluation of right heart failure at the request of Dr. Lowell Guitar.  History of Present Illness:   Tammy Small is currently admitted with worsening shortness of breath and atypical right-sided chest discomfort with associated acute on chronic hypoxic respiratory failure in the setting of emphysema, also a moderate-sized right pleural effusion and associated airspace disease and collapse of the right lower lobe concerning for pneumonia.  She had no evidence of pulmonary embolus by chest CTA.  Follow-up echocardiogram reveals LVEF 60 to 65% with severe RV dysfunction, biatrial enlargement, and mild tricuspid regurgitation with estimated RVSP approximately 60 mmHg.  RV dysfunction has progressed in comparison to prior evaluation in December 2022, although she has had evidence of severe pulmonary hypertension documented previously.  She underwent successful robot-assisted pericardial window with Dr. Cliffton Asters in January for treatment of large pericardial effusion, no significant residual noted at this time.  She has not had recent follow-up with pulmonary medicine, saw Dr. Vassie Loll in October of last year with plan for outpatient sleep study, never completed.  She is on supplemental oxygen at home with chronic hypoxic respiratory failure.  She has a history of paroxysmal atrial fibrillation, currently noted to be in atrial fibrillation with reasonably  controlled rate.  Blood pressure low normal to mildly reduced patient Cozaar and Lopressor have been held.  She underwent right-sided thoracentesis yesterday with removal of 700 cc.  Past Medical History:  Diagnosis Date   (HFpEF) heart failure with preserved ejection fraction (HCC)    CKD (chronic kidney disease)    Essential hypertension    Hyperlipidemia    PAF (paroxysmal atrial fibrillation) (HCC)    Pericardial effusion    Pneumonia    Type 2 diabetes mellitus (HCC)     Past Surgical History:  Procedure Laterality Date   CESAREAN SECTION     HERNIA REPAIR     XI ROBOTIC ASSISTED PERICARDIAL WINDOW Right 11/17/2021   Procedure: RIGHT XI ROBOTIC ASSISTED THORACOSCOPY PERICARDIAL WINDOW;  Surgeon: Corliss Skains, MD;  Location: [facility] OR;  Service: Thoracic;  Laterality: Right;     Inpatient Medications: Scheduled Meds:  apixaban  5 mg Oral BID   atorvastatin  20 mg Oral QPM   Chlorhexidine Gluconate Cloth  6 each Topical Daily   guaiFENesin-dextromethorphan  10 mL Oral Q8H   insulin aspart  0-6 Units Subcutaneous TID WC   levalbuterol  0.63 mg Nebulization Once   Continuous Infusions:  azithromycin Stopped (01/13/22 2225)   cefTRIAXone (ROCEPHIN)  IV Stopped (01/14/22 0556)   PRN Meds: acetaminophen **OR** acetaminophen, ipratropium-albuterol, ondansetron **OR** ondansetron (ZOFRAN) IV, promethazine  Allergies:   No Known Allergies  Social History:   Social History   Tobacco Use   Smoking status: Former    Packs/day: 0.50    Years: 3.00    Pack years: 1.50    Types: Cigarettes    Quit date: 11/07/1972    Years since quitting: 49.2   Smokeless tobacco: Never  Substance Use Topics   Alcohol use: Not Currently  Comment: last use 1974     Family History:   The patient's family history includes Asthma in her brother; Bronchitis in her brother; Diabetes in her father; Hypertension in her father.  ROS:  Please see the history of present illness.  All  other ROS reviewed and negative.     Physical Exam/Data:   Vitals:   01/14/22 0615 01/14/22 0630 01/14/22 0700 01/14/22 0800  BP: (!) 95/53 (!) 80/62 91/68 (!) 83/59  Pulse:    98  Resp: 17 19 18 18   Temp:   98 F (36.7 C)   TempSrc:   Axillary   SpO2:    97%  Weight:      Height:        Intake/Output Summary (Last 24 hours) at 01/14/2022 1026 Last data filed at 01/14/2022 0700 Gross per 24 hour  Intake 351.83 ml  Output 150 ml  Net 201.83 ml   Filed Weights   01/12/22 2337 01/13/22 0438 01/14/22 0456  Weight: 90.5 kg 90.6 kg 92 kg   Body mass index is 37.1 kg/m.   Gen: Patient is in no distress.  On supplemental oxygen via nasal cannula. HEENT: Conjunctiva and lids normal, oropharynx clear. Neck: Supple, no elevated JVP or carotid bruits, no thyromegaly. Lungs: Decreased breath sounds right base, nonlabored breathing at rest. Cardiac: Irregularly irregular, no S3, 1/6 systolic murmur, no pericardial rub. Abdomen: Soft, nontender, bowel sounds present. Extremities: Chronic appearing edema and adipose tissue. Skin: Warm and dry. Musculoskeletal: No kyphosis. Neuropsychiatric: Alert and oriented x3, affect grossly appropriate.  EKG:  An ECG dated 01/12/2022 was personally reviewed today and demonstrated:  Atrial fibrillation with nonspecific ST-T changes.  Telemetry:  I personally reviewed telemetry which shows atrial fibrillation.  Laboratory Data:  Chemistry Recent Labs  Lab 01/12/22 1549 01/13/22 0424 01/14/22 0424  NA 140 137 138  K 3.7 4.3 4.4  CL 103 105 105  CO2 25 21* 23  GLUCOSE 175* 129* 102*  BUN 13 17 23   CREATININE 0.91 0.99 1.35*  CALCIUM 9.2 8.5* 8.4*  GFRNONAA >60 >60 44*  ANIONGAP 12 11 10     Recent Labs  Lab 01/12/22 1549 01/14/22 0424  PROT 7.4 6.1*  ALBUMIN 4.1 3.0*  AST 16 10*  ALT 14 8  ALKPHOS 91 68  BILITOT 3.9* 2.0*  1.9*   Hematology Recent Labs  Lab 01/12/22 1549 01/13/22 0424 01/14/22 0424  WBC 7.8 10.7* 10.7*   RBC 5.34* 4.76 4.56  HGB 16.1* 14.8 13.9  HCT 50.8* 46.9* 45.6  MCV 95.1 98.5 100.0  MCH 30.1 31.1 30.5  MCHC 31.7 31.6 30.5  RDW 14.6 14.6 14.6  PLT 232 205 205   Cardiac Enzymes Recent Labs  Lab 01/12/22 1549 01/12/22 1803  TROPONINIHS 23* 22*   BNP Recent Labs  Lab 01/12/22 1549 01/14/22 0424  BNP 963.0* 620.0*     Radiology/Studies:  DG Chest 1 View  Result Date: 01/13/2022 CLINICAL DATA:  Post RIGHT thoracentesis EXAM: CHEST  1 VIEW COMPARISON:  01/12/2022 FINDINGS: Enlargement of cardiac silhouette. Mild atelectasis versus infiltrate at LEFT base. Significantly decreased RIGHT pleural effusion post thoracentesis. No pneumothorax. Atherosclerotic calcification aorta. IMPRESSION: No pneumothorax following RIGHT thoracentesis. Enlargement of cardiac silhouette with mild persistent atelectasis or infiltrate at LEFT base. Electronically Signed   By: Ulyses Southward M.D.   On: 01/13/2022 12:44   CT Angio Chest PE W/Cm &/Or Wo Cm  Addendum Date: 01/12/2022   ADDENDUM REPORT: 01/12/2022 19:24 ADDENDUM: Although reported in the findings  section, an additional finding of potential significance was not included in the impression. There is possible right supraclavicular lymphadenopathy. Consider further evaluation with neck CT, ideally after the patient's acute shortness of breath has resolved. Electronically Signed   By: Carey Bullocks M.D.   On: January 26, 2022 19:24   Result Date: Jan 26, 2022 CLINICAL DATA:  Shortness of breath with chest pain and pressure since yesterday. Clinical concern for pulmonary embolism. EXAM: CT ANGIOGRAPHY CHEST WITH CONTRAST TECHNIQUE: Multidetector CT imaging of the chest was performed using the standard protocol during bolus administration of intravenous contrast. Multiplanar CT image reconstructions and MIPs were obtained to evaluate the vascular anatomy. RADIATION DOSE REDUCTION: This exam was performed according to the departmental dose-optimization program which  includes automated exposure control, adjustment of the mA and/or kV according to patient size and/or use of iterative reconstruction technique. CONTRAST:  75mL OMNIPAQUE IOHEXOL 350 MG/ML SOLN COMPARISON:  Radiographs today and 11/22/2021. CT without contrast 11/02/2021. FINDINGS: Cardiovascular: The pulmonary arteries are well opacified with contrast to the level of the subsegmental branches. There is mildly heterogeneous enhancement within the lower lobe subsegmental branches, although no discrete intravascular filling defects are seen to suggest acute pulmonary embolism. There is atherosclerosis of the aorta, great vessels and coronary arteries. The heart is moderately enlarged. No significant pericardial fluid. Mediastinum/Nodes: There are no enlarged mediastinal, hilar or axillary lymph nodes.There are small mediastinal lymph nodes, and there is suspicion of an enlarged right supraclavicular node measuring 2.3 x 1.4 cm on image 11/4. The thyroid gland, trachea and esophagus demonstrate no significant findings. Lungs/Pleura: New moderate-sized dependent right pleural effusion with associated compressive right lower lobe atelectasis. Additional patchy airspace opacities are present within the right middle lobe and left lower lobe, probably atelectasis as well. Underlying centrilobular emphysema and central airway thickening are noted. Upper abdomen: Reflux of contrast into the IVC and hepatic veins. The visualized upper abdomen is otherwise unremarkable. Musculoskeletal/Chest wall: There is no chest wall mass or suspicious osseous finding. Mild thoracic spondylosis. Review of the MIP images confirms the above findings. IMPRESSION: 1. No evidence of acute pulmonary embolism. Peripheral branch assessment is limited by breathing artifact and atelectasis. 2. Moderate size right pleural effusion with associated subtotal collapse of the right lower lobe. Additional patchy airspace opacities in the right middle and left  lower lobes. Recommend radiographic follow-up. 3. Cardiomegaly with reflux of contrast into the IVC and hepatic veins suspicious for right heart failure. Coronary and Aortic Atherosclerosis (ICD10-I70.0). 4.  Emphysema (ICD10-J43.9). Electronically Signed: By: Carey Bullocks M.D. On: 01-26-2022 19:07   DG CHEST PORT 1 VIEW  Result Date: 01/14/2022 CLINICAL DATA:  Chest pain starting 2 days ago. Midsternal. Shortness of breath and cough. EXAM: PORTABLE CHEST 1 VIEW COMPARISON:  01/13/2022 AP chest, AP chest and CT chest Jan 26, 2022 FINDINGS: Cardiac silhouette is again moderately to markedly enlarged. Mediastinal contours are unchanged. Calcification is again seen within aortic arch. There is again an epicardial fat pad overlying the left inferolateral hemithorax. No definite pleural effusion is seen on frontal view. Probable left basilar heterogeneous opacification. No pneumothorax. No acute skeletal abnormality. IMPRESSION:: IMPRESSION: 1. Moderately to markedly enlarged cardiac silhouette. 2. Left basilar heterogeneous airspace opacity, atelectasis versus pneumonia. 3. Again marked improvement in the pleural effusion seen on 01/26/2022 following 01/13/2022 thoracentesis. Electronically Signed   By: Neita Garnet M.D.   On: 01/14/2022 08:10   DG Chest Port 1 View  Result Date: 2022-01-26 CLINICAL DATA:  Chest pain and shortness of breath. EXAM: PORTABLE CHEST  1 VIEW COMPARISON:  Chest radiograph 11/22/2021 FINDINGS: The cardiac silhouette remains moderately enlarged. The pericardial drain has been removed. A small right pleural effusion is new or larger than on the prior study. There is associated right basilar atelectasis or consolidation. There is mild residual opacity in the left lung base which has improved from prior. A trace left pleural effusion is possible. No pneumothorax is identified. No acute osseous abnormality is seen. IMPRESSION: 1. Small right pleural effusion with right basilar atelectasis  or consolidation. 2. Improved aeration of the left lung base. 3. Unchanged cardiomegaly. Electronically Signed   By: Sebastian Ache M.D.   On: Feb 10, 2022 16:05   ECHOCARDIOGRAM COMPLETE  Result Date: 01/13/2022    ECHOCARDIOGRAM REPORT   Patient Name:   Tammy Small Date of Exam: 01/13/2022 Medical Rec #:  [id number]    Height:       62.0 in Accession #:    [id number]   Weight:       199.7 lb Date of Birth:  1956/10/30   BSA:          1.911 m Patient Age:    65 years     BP:           124/76 mmHg Patient Gender: F            HR:           104 bpm. [facility] Procedure: 2D Echo, Cardiac Doppler and Color Doppler Indications:    Acute Resipratory distress  History:        Patient has prior history of Echocardiogram examinations, most                 recent 10/11/2021. CHF, Arrythmias:Atrial Fibrillation,                 Signs/Symptoms:Dyspnea; Risk Factors:Hypertension. S/P                 Pericardial window creation.  Sonographer:    Mikki Harbor Referring Phys: [phone] Heloise Beecham EMOKPAE IMPRESSIONS  1. Pericardial effuson gone since window performed.  2. RV function much worse compared to TTE 10/11/21.  3. Left ventricular ejection fraction, by estimation, is 60 to 65%. The left ventricle has normal function. The left ventricle has no regional wall motion abnormalities. There is severe left ventricular hypertrophy. Left ventricular diastolic parameters  are indeterminate.  4. Right ventricular systolic function is severely reduced. The right ventricular size is severely enlarged. There is severely elevated pulmonary artery systolic pressure.  5. Left atrial size was moderately dilated.  6. Right atrial size was severely dilated.  7. The mitral valve is abnormal. No evidence of mitral valve regurgitation. No evidence of mitral stenosis.  8. The aortic valve is tricuspid. There is mild calcification of the aortic valve. Aortic valve regurgitation is not visualized. Aortic valve sclerosis is present,  with no evidence of aortic valve stenosis.  9. The inferior vena cava is normal in size with greater than 50% respiratory variability, suggesting right atrial pressure of 3 mmHg. FINDINGS  Left Ventricle: Left ventricular ejection fraction, by estimation, is 60 to 65%. The left ventricle has normal function. The left ventricle has no regional wall motion abnormalities. The left ventricular internal cavity size was normal in size. There is  severe left ventricular hypertrophy. Left ventricular diastolic parameters are indeterminate. Right Ventricle: The right ventricular size is severely enlarged. No increase in right ventricular wall thickness. Right ventricular systolic function is severely reduced.  There is severely elevated pulmonary artery systolic pressure. The tricuspid regurgitant velocity is 3.38 m/s, and with an assumed right atrial pressure of 20 mmHg, the estimated right ventricular systolic pressure is 65.7 mmHg. Left Atrium: Left atrial size was moderately dilated. Right Atrium: Right atrial size was severely dilated. Pericardium: There is no evidence of pericardial effusion. Mitral Valve: The mitral valve is abnormal. There is mild thickening of the mitral valve leaflet(s). There is mild calcification of the mitral valve leaflet(s). Mild mitral annular calcification. No evidence of mitral valve regurgitation. No evidence of mitral valve stenosis. MV peak gradient, 2.8 mmHg. The mean mitral valve gradient is 1.0 mmHg. Tricuspid Valve: The tricuspid valve is normal in structure. Tricuspid valve regurgitation is mild . No evidence of tricuspid stenosis. Aortic Valve: The aortic valve is tricuspid. There is mild calcification of the aortic valve. Aortic valve regurgitation is not visualized. Aortic valve sclerosis is present, with no evidence of aortic valve stenosis. Pulmonic Valve: The pulmonic valve was normal in structure. Pulmonic valve regurgitation is mild. No evidence of pulmonic stenosis. Aorta:  The aortic root is normal in size and structure. Venous: The inferior vena cava is normal in size with greater than 50% respiratory variability, suggesting right atrial pressure of 3 mmHg. IAS/Shunts: No atrial level shunt detected by color flow Doppler. Additional Comments: Pericardial effuson gone since window performed. RV function much worse compared to TTE 10/11/21.  LEFT VENTRICLE PLAX 2D LVIDd:         3.60 cm   Diastology LVIDs:         1.40 cm   LV e' medial:    5.11 cm/s LV PW:         1.90 cm   LV E/e' medial:  14.8 LV IVS:        1.70 cm   LV e' lateral:   5.66 cm/s LVOT diam:     2.00 cm   LV E/e' lateral: 13.4 LV SV:         40 LV SV Index:   21 LVOT Area:     3.14 cm  RIGHT VENTRICLE RV Basal diam:  4.45 cm RV Mid diam:    3.50 cm RV S prime:     7.94 cm/s LEFT ATRIUM             Index        RIGHT ATRIUM           Index LA diam:        5.00 cm 2.62 cm/m   RA Area:     38.50 cm LA Vol (A2C):   83.5 ml 43.70 ml/m  RA Volume:   153.00 ml 80.08 ml/m LA Vol (A4C):   84.4 ml 44.17 ml/m LA Biplane Vol: 91.8 ml 48.05 ml/m  AORTIC VALVE             PULMONIC VALVE LVOT Vmax:   73.10 cm/s  PV Vmax:       0.56 m/s LVOT Vmean:  39.200 cm/s PV Peak grad:  1.2 mmHg LVOT VTI:    0.126 m  AORTA Ao Root diam: 3.10 cm Ao Asc diam:  3.30 cm MITRAL VALVE               TRICUSPID VALVE MV Area (PHT): 5.23 cm    TR Peak grad:   45.7 mmHg MV Area VTI:   3.30 cm    TR Vmax:        338.00 cm/s MV Peak  grad:  2.8 mmHg MV Mean grad:  1.0 mmHg    SHUNTS MV Vmax:       0.83 m/s    Systemic VTI:  0.13 m MV Vmean:      49.7 cm/s   Systemic Diam: 2.00 cm MV Decel Time: 145 msec MV E velocity: 75.80 cm/s Charlton Haws MD Electronically signed by Charlton Haws MD Signature Date/Time: 01/13/2022/11:35:59 AM    Final    US THORACENTESIS ASP PLEURAL SPACE W/IMG GUIDE  Result Date: 01/13/2022 INDICATION: RIGHT pleural effusion EXAM: ULTRASOUND GUIDED DIAGNOSTIC AND THERAPEUTIC RIGHT THORACENTESIS MEDICATIONS: None. COMPLICATIONS:  None immediate. PROCEDURE: An ultrasound guided thoracentesis was thoroughly discussed with the patient and questions answered. The benefits, risks, alternatives and complications were also discussed. The patient understands and wishes to proceed with the procedure. Written consent was obtained. Ultrasound was performed to localize and mark an adequate pocket of fluid in the RIGHT chest. The area was then prepped and draped in the normal sterile fashion. 1% Lidocaine was used for local anesthesia. Under ultrasound guidance a 8 French thoracentesis catheter was introduced. Thoracentesis was performed. The catheter was removed and a dressing applied. FINDINGS: A total of approximately 700 mL of dark yellow RIGHT pleural fluid was removed. Samples were sent to the laboratory as requested by the clinical team. IMPRESSION: Successful ultrasound guided RIGHT thoracentesis yielding 700 ML for high flow foot for of pleural fluid. Electronically Signed   By: Ulyses Southward M.D.   On: 01/13/2022 12:43    Assessment and Plan:   1.  Acute on chronic hypoxic respiratory failure, patient presenting with moderate-sized right pleural effusion with associated airspace disease and right lower lobe collapse concerning for possible pneumonia, rule out endobronchial process.  She did not have pulmonary embolus by CT angiography.  Has history of emphysema, possibly OSA although not formally documented as yet.  Also associated severe pulmonary hypertension which is not new based on echocardiography, however she has progressive RV dysfunction by her most recent study.  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  2.  Paroxysmal to persistent atrial fibrillation with CHA2DS2-VASc score of 3-4.  She is on Eliquis for stroke prophylaxis.  Lopressor held temporarily with low blood pressures.  3.  History of large pericardial effusion status post robot-assisted pericardial window by Dr. Cliffton Asters in January.  No significant residual collection  at this point.  Appeared to be inflammatory in etiology.  4.  History of HFpEF, LVEF remains normal at 60 to 65%.    Suggest involvement of Pulmonary to assist with management.  Agree with holding Lasix at this time as well as Cozaar, she is status post thoracentesis as well, possibly parapneumonic.  Follow blood pressure and heart rate in atrial fibrillation, may need cautious resumption of low-dose Lopressor eventually.  Pending further pulmonary work-up (including eventually a sleep study), she may ultimately benefit from a right heart catheterization to help guide long term treatment.  Signed, Nona Dell, MD  01/14/2022 10:26 AM

## 2022-01-14 NOTE — Consult Note (Signed)
Name: Tammy Small MRN: [id number] DOB: [birth year]    ADMISSION DATE:  01/15/2022 CONSULTATION DATE:  01/14/2022   REFERRING MD :  Clair Gulling  CHIEF COMPLAINT: Shortness of breath, pleural effusion   HISTORY OF PRESENT ILLNESS: 66 year old remote smoker whom I saw in consultation in October 2022 for shortness of breath and hypoxia.  She had a hospital admission 05/2021 for hypoxic respiratory failure and treated for CHF exacerbation.  I noted that she had a pericardial effusion dating back to 05/2020 without evidence of tamponade and echo 05/2021 showed severe pulm hypertension .  She was noted to desaturate on exertion but wanted to hold off on oxygen .  Serology showed positive ANA She was seen by rheumatology, treated with colchicine and Lasix without improvement in effusion, she finally underwent pericardial window in January 2023 by Dr. Kipp Brood . Pericardial biopsy showed changes of chronic inflammation without malignancy or granulomas.  AFB and fungal cultures were negative   She was admitted 3/8 with chest pain substernal with shortness of breath for 2 days.  Noted to be hypoxic to 80% on room air, required nonrebreather and then BiPAP CT chest did not show any evidence of pulmonary embolism but showed airspace disease in the right middle left lower lobe and right pleural effusion She was admitted with presumptive diagnosis of pneumonia and CHF.  Treated with ceftriaxone and azithromycin, IV Lasix Right thoracentesis was performed with removal of 700 cc of fluid.  This showed LDH 288, protein 3.6 history of exudate with 32 K white cells 78% neutrophils, fluid cultures negative so far  Significant tests/ events reviewed  3/8 CT angiogram chest >> moderate right pleural effusion with patchy airspace disease in right middle and left lower lobe, mild atelectasis of right lower lobe, emphysema, right supraclavicular lymphadenopathy 2.3 x 1.4 cm  3/9 RT thoracentesis with removal of 700  cc of dark yellow fluid  Echo >> normal LVEF, RVSP 60, severe RV dysfunction    PAST MEDICAL HISTORY :   Pericardial window 11/2021 for large pericardial effusion Chronic hypoxic respiratory failure on oxygen Paroxysmal atrial fibrillation CKD stage III  Past Medical History:  Diagnosis Date   (HFpEF) heart failure with preserved ejection fraction (HCC)    CKD (chronic kidney disease)    Essential hypertension    Hyperlipidemia    PAF (paroxysmal atrial fibrillation) (HCC)    Pericardial effusion    Pneumonia    Type 2 diabetes mellitus ([place])    Past Surgical History:  Procedure Laterality Date   CESAREAN SECTION     HERNIA REPAIR     XI ROBOTIC ASSISTED PERICARDIAL WINDOW Right 11/17/2021   Procedure: RIGHT XI ROBOTIC ASSISTED THORACOSCOPY PERICARDIAL WINDOW;  Surgeon: Lajuana Matte, MD;  Location: [facility];  Service: Thoracic;  Laterality: Right;   Prior to Admission medications   Medication Sig Start Date End Date Taking? Authorizing Provider  apixaban (ELIQUIS) 5 MG TABS tablet Take 1 tablet (5 mg total) by mouth 2 (two) times daily. 05/24/21  Yes Soyla Dryer, PA-C  atorvastatin (LIPITOR) 20 MG tablet TAKE 1 Tablet BY MOUTH ONCE EVERY DAY Patient taking differently: Take 20 mg by mouth every evening. 12/15/21  Yes Soyla Dryer, PA-C  Cholecalciferol (VITAMIN D3 PO) Take 1 tablet by mouth daily.   Yes [provider]  Cyanocobalamin (B-12 PO) Take 1 tablet by mouth daily.   Yes [provider]  furosemide (LASIX) 40 MG tablet Take 1 tablet (40 mg total) by mouth daily. 11/23/21  Yes Barrett, Erin R, PA-C  losartan (COZAAR) 25 MG tablet Take 1 tablet (25 mg total) by mouth daily. 12/14/21 03/14/22 Yes Satira Sark, MD  metoprolol tartrate (LOPRESSOR) 25 MG tablet Take 25 mg by mouth 2 (two) times daily.   Yes [provider]  Omega-3 Fatty Acids (OMEGA 3 PO) Take 1 capsule by mouth daily.   Yes [provider]  potassium  chloride SA (KLOR-CON M) 20 MEQ tablet Take 1 tablet (20 mEq total) by mouth daily. 11/23/21  Yes Barrett, Erin R, PA-C   No Known Allergies  FAMILY HISTORY:  Family History  Problem Relation Age of Onset   Hypertension Father    Diabetes Father    Asthma Brother    Bronchitis Brother    SOCIAL HISTORY:  reports that she quit smoking about 49 years ago. Her smoking use included cigarettes. She has a 1.50 pack-year smoking history. She has never used smokeless tobacco. She reports that she does not currently use alcohol. She reports that she does not currently use drugs after having used the following drugs: Cocaine.  REVIEW OF SYSTEMS:    Shortness of breath and chest pain    Constitutional: Negative for fever, chills, weight loss, malaise/fatigue and diaphoresis.  HENT: Negative for hearing loss, ear pain, nosebleeds, congestion, sore throat, neck pain, tinnitus and ear discharge.   Eyes: Negative for blurred vision, double vision, photophobia, pain, discharge and redness.  Respiratory: Negative for cough, hemoptysis, sputum production, wheezing and stridor.   Cardiovascular: Negative for  palpitations, orthopnea, claudication Gastrointestinal: Negative for heartburn, nausea, vomiting, abdominal pain, diarrhea, constipation, blood in stool and melena.  Genitourinary: Negative for dysuria, urgency, frequency, hematuria and flank pain.  Musculoskeletal: Negative for myalgias, back pain, joint pain and falls.  Skin: Negative for itching and rash.  Neurological: Negative for dizziness, tingling, tremors, sensory change, speech change, focal weakness, seizures, loss of consciousness, weakness and headaches.  Endo/Heme/Allergies: Negative for environmental allergies and polydipsia. Does not bruise/bleed easily.  SUBJECTIVE:   VITAL SIGNS: Temp:  [97.7 F (36.5 C)-99.7 F (37.6 C)] 97.7 F (36.5 C) (03/10 1100) Pulse Rate:  [25-108] 108 (03/10 1000) Resp:  [15-24] 22 (03/10  1300) BP: (69-106)/(41-70) 90/57 (03/10 1300) SpO2:  [87 %-98 %] 87 % (03/10 1000) Weight:  [92 kg] 92 kg (03/10 0456)  PHYSICAL EXAMINATION: Gen. Pleasant, well-nourished, lying supine, in no distress, on 13 L high flow nasal cannula, saturation reading 88% ENT - no lesions, no post nasal drip Neck: No JVD, no thyromegaly, no carotid bruits Lungs: no use of accessory muscles, no dullness to percussion, decreased breath sounds bilateral without rales or rhonchi  Cardiovascular: Rhythm regular, heart sounds  normal, no murmurs, 1+ peripheral edema Abdomen: soft and non-tender, no hepatosplenomegaly, BS normal. Musculoskeletal: No deformities, no cyanosis or clubbing Neuro:  alert, non focal Skin:  Warm, no lesions/ rash   Recent Labs  Lab 01/08/2022 1549 01/13/22 0424 01/14/22 0424  NA 140 137 138  K 3.7 4.3 4.4  CL 103 105 105  CO2 25 21* 23  BUN 13 17 23   CREATININE 0.91 0.99 1.35*  GLUCOSE 175* 129* 102*   Recent Labs  Lab 02/02/2022 1549 01/13/22 0424 01/14/22 0424  HGB 16.1* 14.8 13.9  HCT 50.8* 46.9* 45.6  WBC 7.8 10.7* 10.7*  PLT 232 205 205   DG Chest 1 View  Result Date: 01/13/2022 CLINICAL DATA:  Post RIGHT thoracentesis EXAM: CHEST  1 VIEW COMPARISON:  01/31/2022 FINDINGS: Enlargement of cardiac  silhouette. Mild atelectasis versus infiltrate at LEFT base. Significantly decreased RIGHT pleural effusion post thoracentesis. No pneumothorax. Atherosclerotic calcification aorta. IMPRESSION: No pneumothorax following RIGHT thoracentesis. Enlargement of cardiac silhouette with mild persistent atelectasis or infiltrate at LEFT base. Electronically Signed   By: Lavonia Dana M.D.   On: 01/13/2022 12:44   CT Angio Chest PE W/Cm &/Or Wo Cm  Addendum Date: 01/27/2022   ADDENDUM REPORT: 01/06/2022 19:24 ADDENDUM: Although reported in the findings section, an additional finding of potential significance was not included in the impression. There is possible right supraclavicular  lymphadenopathy. Consider further evaluation with neck CT, ideally after the patient's acute shortness of breath has resolved. Electronically Signed   By: Richardean Sale M.D.   On: 01/29/2022 19:24   Result Date: 01/24/2022 CLINICAL DATA:  Shortness of breath with chest pain and pressure since yesterday. Clinical concern for pulmonary embolism. EXAM: CT ANGIOGRAPHY CHEST WITH CONTRAST TECHNIQUE: Multidetector CT imaging of the chest was performed using the standard protocol during bolus administration of intravenous contrast. Multiplanar CT image reconstructions and MIPs were obtained to evaluate the vascular anatomy. RADIATION DOSE REDUCTION: This exam was performed according to the departmental dose-optimization program which includes automated exposure control, adjustment of the mA and/or kV according to patient size and/or use of iterative reconstruction technique. CONTRAST:  23mL OMNIPAQUE IOHEXOL 350 MG/ML SOLN COMPARISON:  Radiographs today and 11/22/2021. CT without contrast 11/02/2021. FINDINGS: Cardiovascular: The pulmonary arteries are well opacified with contrast to the level of the subsegmental branches. There is mildly heterogeneous enhancement within the lower lobe subsegmental branches, although no discrete intravascular filling defects are seen to suggest acute pulmonary embolism. There is atherosclerosis of the aorta, great vessels and coronary arteries. The heart is moderately enlarged. No significant pericardial fluid. Mediastinum/Nodes: There are no enlarged mediastinal, hilar or axillary lymph nodes.There are small mediastinal lymph nodes, and there is suspicion of an enlarged right supraclavicular node measuring 2.3 x 1.4 cm on image 11/4. The thyroid gland, trachea and esophagus demonstrate no significant findings. Lungs/Pleura: New moderate-sized dependent right pleural effusion with associated compressive right lower lobe atelectasis. Additional patchy airspace opacities are present  within the right middle lobe and left lower lobe, probably atelectasis as well. Underlying centrilobular emphysema and central airway thickening are noted. Upper abdomen: Reflux of contrast into the IVC and hepatic veins. The visualized upper abdomen is otherwise unremarkable. Musculoskeletal/Chest wall: There is no chest wall mass or suspicious osseous finding. Mild thoracic spondylosis. Review of the MIP images confirms the above findings. IMPRESSION: 1. No evidence of acute pulmonary embolism. Peripheral branch assessment is limited by breathing artifact and atelectasis. 2. Moderate size right pleural effusion with associated subtotal collapse of the right lower lobe. Additional patchy airspace opacities in the right middle and left lower lobes. Recommend radiographic follow-up. 3. Cardiomegaly with reflux of contrast into the IVC and hepatic veins suspicious for right heart failure. Coronary and Aortic Atherosclerosis (ICD10-I70.0). 4.  Emphysema (ICD10-J43.9). Electronically Signed: By: Richardean Sale M.D. On: 01/23/2022 19:07   DG CHEST PORT 1 VIEW  Result Date: 01/14/2022 CLINICAL DATA:  Chest pain starting 2 days ago. Midsternal. Shortness of breath and cough. EXAM: PORTABLE CHEST 1 VIEW COMPARISON:  01/13/2022 AP chest, AP chest and CT chest 01/20/2022 FINDINGS: Cardiac silhouette is again moderately to markedly enlarged. Mediastinal contours are unchanged. Calcification is again seen within aortic arch. There is again an epicardial fat pad overlying the left inferolateral hemithorax. No definite pleural effusion is seen on frontal view. Probable  left basilar heterogeneous opacification. No pneumothorax. No acute skeletal abnormality. IMPRESSION:: IMPRESSION: 1. Moderately to markedly enlarged cardiac silhouette. 2. Left basilar heterogeneous airspace opacity, atelectasis versus pneumonia. 3. Again marked improvement in the pleural effusion seen on 02/02/2022 following 01/13/2022 thoracentesis.  Electronically Signed   By: Yvonne Kendall M.D.   On: 01/14/2022 08:10   DG Chest Port 1 View  Result Date: 01/07/2022 CLINICAL DATA:  Chest pain and shortness of breath. EXAM: PORTABLE CHEST 1 VIEW COMPARISON:  Chest radiograph 11/22/2021 FINDINGS: The cardiac silhouette remains moderately enlarged. The pericardial drain has been removed. A small right pleural effusion is new or larger than on the prior study. There is associated right basilar atelectasis or consolidation. There is mild residual opacity in the left lung base which has improved from prior. A trace left pleural effusion is possible. No pneumothorax is identified. No acute osseous abnormality is seen. IMPRESSION: 1. Small right pleural effusion with right basilar atelectasis or consolidation. 2. Improved aeration of the left lung base. 3. Unchanged cardiomegaly. Electronically Signed   By: Logan Bores M.D.   On: 01/29/2022 16:05   ECHOCARDIOGRAM COMPLETE  Result Date: 01/13/2022    ECHOCARDIOGRAM REPORT   Patient Name:   MYLIYAH STASIK Date of Exam: 01/13/2022 Medical Rec #:  [id number]    Height:       62.0 in Accession #:    [id number]   Weight:       199.7 lb Date of Birth:  09/20/1956   BSA:          1.911 m Patient Age:    30 years     BP:           124/76 mmHg Patient Gender: F            HR:           104 bpm. Exam Location:  [facility] Procedure: 2D Echo, Cardiac Doppler and Color Doppler Indications:    Acute Resipratory distress  History:        Patient has prior history of Echocardiogram examinations, most                 recent 10/11/2021. CHF, Arrythmias:Atrial Fibrillation,                 Signs/Symptoms:Dyspnea; Risk Factors:Hypertension. S/P                 Pericardial window creation.  Sonographer:    Wenda Low Referring Phys: [place]  1. Pericardial effuson gone since window performed.  2. RV function much worse compared to TTE 10/11/21.  3. Left ventricular ejection fraction, by estimation, is 60  to 65%. The left ventricle has normal function. The left ventricle has no regional wall motion abnormalities. There is severe left ventricular hypertrophy. Left ventricular diastolic parameters  are indeterminate.  4. Right ventricular systolic function is severely reduced. The right ventricular size is severely enlarged. There is severely elevated pulmonary artery systolic pressure.  5. Left atrial size was moderately dilated.  6. Right atrial size was severely dilated.  7. The mitral valve is abnormal. No evidence of mitral valve regurgitation. No evidence of mitral stenosis.  8. The aortic valve is tricuspid. There is mild calcification of the aortic valve. Aortic valve regurgitation is not visualized. Aortic valve sclerosis is present, with no evidence of aortic valve stenosis.  9. The inferior vena cava is normal in size with greater than 50% respiratory variability, suggesting right atrial  pressure of 3 mmHg. FINDINGS  Left Ventricle: Left ventricular ejection fraction, by estimation, is 60 to 65%. The left ventricle has normal function. The left ventricle has no regional wall motion abnormalities. The left ventricular internal cavity size was normal in size. There is  severe left ventricular hypertrophy. Left ventricular diastolic parameters are indeterminate. Right Ventricle: The right ventricular size is severely enlarged. No increase in right ventricular wall thickness. Right ventricular systolic function is severely reduced. There is severely elevated pulmonary artery systolic pressure. The tricuspid regurgitant velocity is 3.38 m/s, and with an assumed right atrial pressure of 20 mmHg, the estimated right ventricular systolic pressure is [id number] mmHg. Left Atrium: Left atrial size was moderately dilated. Right Atrium: Right atrial size was severely dilated. Pericardium: There is no evidence of pericardial effusion. Mitral Valve: The mitral valve is abnormal. There is mild thickening of the mitral valve  leaflet(s). There is mild calcification of the mitral valve leaflet(s). Mild mitral annular calcification. No evidence of mitral valve regurgitation. No evidence of mitral valve stenosis. MV peak gradient, 2.8 mmHg. The mean mitral valve gradient is 1.0 mmHg. Tricuspid Valve: The tricuspid valve is normal in structure. Tricuspid valve regurgitation is mild . No evidence of tricuspid stenosis. Aortic Valve: The aortic valve is tricuspid. There is mild calcification of the aortic valve. Aortic valve regurgitation is not visualized. Aortic valve sclerosis is present, with no evidence of aortic valve stenosis. Pulmonic Valve: The pulmonic valve was normal in structure. Pulmonic valve regurgitation is mild. No evidence of pulmonic stenosis. Aorta: The aortic root is normal in size and structure. Venous: The inferior vena cava is normal in size with greater than 50% respiratory variability, suggesting right atrial pressure of 3 mmHg. IAS/Shunts: No atrial level shunt detected by color flow Doppler. Additional Comments: Pericardial effuson gone since window performed. RV function much worse compared to TTE 10/11/21.  LEFT VENTRICLE PLAX 2D LVIDd:         3.60 cm   Diastology LVIDs:         1.40 cm   LV e' medial:    5.11 cm/s LV PW:         1.90 cm   LV E/e' medial:  14.8 LV IVS:        1.70 cm   LV e' lateral:   5.66 cm/s LVOT diam:     2.00 cm   LV E/e' lateral: 13.4 LV SV:         40 LV SV Index:   21 LVOT Area:     3.14 cm  RIGHT VENTRICLE RV Basal diam:  4.45 cm RV Mid diam:    3.50 cm RV S prime:     7.94 cm/s LEFT ATRIUM             Index        RIGHT ATRIUM           Index LA diam:        5.00 cm 2.62 cm/m   RA Area:     38.50 cm LA Vol (A2C):   83.5 ml 43.70 ml/m  RA Volume:   153.00 ml 80.08 ml/m LA Vol (A4C):   84.4 ml 44.17 ml/m LA Biplane Vol: 91.8 ml 48.05 ml/m  AORTIC VALVE             PULMONIC VALVE LVOT Vmax:   73.10 cm/s  PV Vmax:       0.56 m/s LVOT Vmean:  39.200 cm/s PV Peak grad:  1.2 mmHg LVOT  VTI:    0.126 m  AORTA Ao Root diam: 3.10 cm Ao Asc diam:  3.30 cm MITRAL VALVE               TRICUSPID VALVE MV Area (PHT): 5.23 cm    TR Peak grad:   45.7 mmHg MV Area VTI:   3.30 cm    TR Vmax:        338.00 cm/s MV Peak grad:  2.8 mmHg MV Mean grad:  1.0 mmHg    SHUNTS MV Vmax:       0.83 m/s    Systemic VTI:  0.13 m MV Vmean:      49.7 cm/s   Systemic Diam: 2.00 cm MV Decel Time: 145 msec MV E velocity: 75.80 cm/s Jenkins Rouge MD Electronically signed by Jenkins Rouge MD Signature Date/Time: 01/13/2022/11:35:59 AM    Final    US THORACENTESIS ASP PLEURAL SPACE W/IMG GUIDE  Result Date: 01/13/2022 INDICATION: RIGHT pleural effusion EXAM: ULTRASOUND GUIDED DIAGNOSTIC AND THERAPEUTIC RIGHT THORACENTESIS MEDICATIONS: None. COMPLICATIONS: None immediate. PROCEDURE: An ultrasound guided thoracentesis was thoroughly discussed with the patient and questions answered. The benefits, risks, alternatives and complications were also discussed. The patient understands and wishes to proceed with the procedure. Written consent was obtained. Ultrasound was performed to localize and mark an adequate pocket of fluid in the RIGHT chest. The area was then prepped and draped in the normal sterile fashion. 1% Lidocaine was used for local anesthesia. Under ultrasound guidance a 8 French thoracentesis catheter was introduced. Thoracentesis was performed. The catheter was removed and a dressing applied. FINDINGS: A total of approximately 700 mL of dark yellow RIGHT pleural fluid was removed. Samples were sent to the laboratory as requested by the clinical team. IMPRESSION: Successful ultrasound guided RIGHT thoracentesis yielding 700 ML for high flow foot for of pleural fluid. Electronically Signed   By: Lavonia Dana M.D.   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 -No residual pericardial effusion is noted , previously felt to be inflammatory -Continue high flow nasal cannula and dial down as hypoxia improves, does not need BiPAP  Acute cor pulmonale/severe pulm hypertension -Not sure what is the etiology here, pericardial effusion is now resolved, she does not seem to have significant underlying lung disease, VQ scan was - 05/2021  -will need right heart cath eventually once acute issues resolve -Would avoid fluid bolus here , okay to use low-dose Levophed if needed for hypotension   Community-acquired pneumonia Right parapneumonic effusion -fluid cultures are negative Chest x-ray shows complete resolution of effusion Would continue ceftriaxone , reassess chest x-ray in 2 to 3 days for reaccumulation of effusion , suggest prolonged course of antibiotics at least 10 to 14 days  Right supraclavicular lymphadenopathy -this appears to be a new finding compared to previous CT -Would need evaluation once acute issues resolved with an FNA or follow-up imaging  Kara Mead MD. FCCP. [facility] Pager [phone] If no response call [phone]    01/14/2022, 1:33 PM

## 2022-01-14 NOTE — Assessment & Plan Note (Addendum)
Related to hypotension, diuresis ?Holding losartan, lasix - s/p bolus ?Follow UA - 30 mg/dl protein, no blood ?Follow renal US ?

## 2022-01-14 NOTE — Assessment & Plan Note (Addendum)
Hemodynamically mediated? Related to right heart failure? ?Indirect ?Overall, improved today ?Consider additional w/u as indicated. ?

## 2022-01-15 ENCOUNTER — Inpatient Hospital Stay (HOSPITAL_COMMUNITY): Payer: Self-pay

## 2022-01-15 ENCOUNTER — Inpatient Hospital Stay: Payer: Self-pay

## 2022-01-15 DIAGNOSIS — R600 Localized edema: Secondary | ICD-10-CM

## 2022-01-15 DIAGNOSIS — I2609 Other pulmonary embolism with acute cor pulmonale: Secondary | ICD-10-CM

## 2022-01-15 LAB — COMPREHENSIVE METABOLIC PANEL
ALT: 10 U/L (ref 0–44)
AST: 15 U/L (ref 15–41)
Albumin: 2.9 g/dL — ABNORMAL LOW (ref 3.5–5.0)
Alkaline Phosphatase: 72 U/L (ref 38–126)
Anion gap: 9 (ref 5–15)
BUN: 30 mg/dL — ABNORMAL HIGH (ref 8–23)
CO2: 24 mmol/L (ref 22–32)
Calcium: 8.9 mg/dL (ref 8.9–10.3)
Chloride: 104 mmol/L (ref 98–111)
Creatinine, Ser: 1.36 mg/dL — ABNORMAL HIGH (ref 0.44–1.00)
GFR, Estimated: 43 mL/min — ABNORMAL LOW (ref >60)
Glucose, Bld: 107 mg/dL — ABNORMAL HIGH (ref 70–99)
Potassium: 4.3 mmol/L (ref 3.5–5.1)
Sodium: 137 mmol/L (ref 135–145)
Total Bilirubin: 1.4 mg/dL — ABNORMAL HIGH (ref 0.3–1.2)
Total Protein: 6.1 g/dL — ABNORMAL LOW (ref 6.5–8.1)

## 2022-01-15 LAB — CBC WITH DIFFERENTIAL/PLATELET
Abs Immature Granulocytes: 0.02 10*3/uL (ref 0.00–0.07)
Basophils Absolute: 0 10*3/uL (ref 0.0–0.1)
Basophils Relative: 0 %
Eosinophils Absolute: 0.1 10*3/uL (ref 0.0–0.5)
Eosinophils Relative: 1 %
HCT: 45.5 % (ref 36.0–46.0)
Hemoglobin: 14 g/dL (ref 12.0–15.0)
Immature Granulocytes: 0 %
Lymphocytes Relative: 20 %
Lymphs Abs: 1.8 10*3/uL (ref 0.7–4.0)
MCH: 30.5 pg (ref 26.0–34.0)
MCHC: 30.8 g/dL (ref 30.0–36.0)
MCV: 99.1 fL (ref 80.0–100.0)
Monocytes Absolute: 1.8 10*3/uL — ABNORMAL HIGH (ref 0.1–1.0)
Monocytes Relative: 21 %
Neutro Abs: 5.1 10*3/uL (ref 1.7–7.7)
Neutrophils Relative %: 58 %
Platelets: 243 10*3/uL (ref 150–400)
RBC: 4.59 MIL/uL (ref 3.87–5.11)
RDW: 14.7 % (ref 11.5–15.5)
WBC: 8.8 10*3/uL (ref 4.0–10.5)
nRBC: 0 % (ref 0.0–0.2)

## 2022-01-15 LAB — GLUCOSE, CAPILLARY
Glucose-Capillary: 92 mg/dL (ref 70–99)
Glucose-Capillary: 109 mg/dL — ABNORMAL HIGH (ref 70–99)
Glucose-Capillary: 98 mg/dL (ref 70–99)
Glucose-Capillary: 148 mg/dL — ABNORMAL HIGH (ref 70–99)

## 2022-01-15 LAB — PHOSPHORUS: Phosphorus: 3.7 mg/dL (ref 2.5–4.6)

## 2022-01-15 LAB — MAGNESIUM: Magnesium: 1.8 mg/dL (ref 1.7–2.4)

## 2022-01-15 MED ORDER — NOREPINEPHRINE 4 MG/250ML-% IV SOLN
INTRAVENOUS | Status: AC
  Administered 2022-01-15: 4 mg
  Filled 2022-01-15: qty 250

## 2022-01-15 MED ORDER — NOREPINEPHRINE 4 MG/250ML-% IV SOLN
2.0000 ug/min | INTRAVENOUS | Status: DC
  Administered 2022-01-15: 3 ug/min via INTRAVENOUS
  Filled 2022-01-15: qty 250

## 2022-01-15 MED ORDER — POLYVINYL ALCOHOL 1.4 % OP SOLN: 2.0000 [drp] | OPHTHALMIC | Status: DC | PRN

## 2022-01-15 MED ORDER — SODIUM CHLORIDE 0.9 % IV SOLN
250.0000 mL | INTRAVENOUS | Status: DC
  Administered 2022-01-15 (×2): 250 mL via INTRAVENOUS

## 2022-01-15 MED ORDER — METOPROLOL TARTRATE 25 MG PO TABS
25.0000 mg | ORAL_TABLET | Freq: Two times a day (BID) | ORAL | Status: DC
  Administered 2022-01-15 – 2022-01-16 (×3): 25 mg via ORAL
  Filled 2022-01-15 (×3): qty 1

## 2022-01-15 MED ORDER — SALINE SPRAY 0.65 % NA SOLN
1.0000 | NASAL | Status: DC | PRN
  Administered 2022-01-15: 1 via NASAL
  Filled 2022-01-15: qty 44

## 2022-01-15 NOTE — Assessment & Plan Note (Signed)
Degree of chronicity? ?Follow [place] ?

## 2022-01-15 NOTE — Progress Notes (Addendum)
RN aware to notify [facility] to place PICC line.  USG PIV not needed d/t PICC order. ?

## 2022-01-15 NOTE — Progress Notes (Addendum)
PROGRESS NOTE    Tammy Small  [id number] DOB: [birth year] DOA: 01/18/2022 PCP: Soyla Dryer, PA-C  Chief Complaint  Patient presents with   Chest Pain    Brief Narrative:  65 yo with hx HFpEF, pericardial effusion now s/p pericardial window 11/2021, atrial fibrillation, HTN, T2DM who presented with chest pain and shortness of breath, found to have right sided pleural effusion with patchy airspace opacities.  Also concern for heart failure exacerbation.  She's been admitted for acute hypoxic respiratory failure, being treated for pneumonia, pleural effusion, and HF exacerbation.  See below for additional details    Assessment & Plan:   Principal Problem:   Acute respiratory failure with hypoxia ([place]) Active Problems:   Pneumonia   Parapneumonic Effusion   Hypotension   Acute exacerbation of CHF (congestive heart failure) (HCC)   Acute cor pulmonale (HCC)   Paroxysmal Neetu Carrozza-fib (HCC)   S/P Xi Robotic assisted Video Assisted Left Pericardial Window Creation   Supraclavicular lymphadenopathy   AKI (acute kidney injury) (HCC)   Nausea & vomiting   Total bilirubin, elevated   HTN (hypertension)   Type 2 diabetes mellitus (HCC)   Edema of right lower extremity   Assessment and Plan: * Acute respiratory failure with hypoxia (HCC) Likely secondary to pneumonia and moderate pleural effusion with subtotal lower lobe collapse.  Possible contribution from HF. Symptomatically improved since thora Continues with significant O2 requirement, on 14 L CXR 3/10 with enlarged cardiac silhouette, L basilar airspace opacity, improved R effusion See below CXR 3/11 pending  Parapneumonic Effusion CT with moderate R effusion Exudative by lights criteria.  Follow thoracentesis and thora labs -> gram stain without organisms, culture pending, cell count 31,800 (78% neutrophils).  PH pending, cytology pending.   Pneumonia Mild leukocytosis, afebrile  CT chest with moderate R pleural  effusion with subtotal collapse of RLL and patchy airspace opacities in right middle and left lower lobes Continue CAP coverage with ceftriaxone and azithromycin (3/8-present).  With parapneumonic effusion, will need extended course of abx. Urine strep negative, legionella negative, sputum cx pending collection Negative MRSA pcr procalcitonin not particularly impressive No blood cx collected  Hypotension Currently in the [id number] systolic (as low as 0000000 systolic overnight) Holding losartan and lasix as well as metoprolol. Opiates contributing, will hold with improved pain Started on midodrine yesterday afternoon On 3 mcg/min levophed at this time with worsening hypotension again overnight - Wean for SBP>90, map>65.  Will plan for picc.  Acute cor pulmonale (HCC) Appreciate pulm recs, recommending RHC once acute issues resolved  Acute exacerbation of CHF (congestive heart failure) (HCC) CTA chest showed moderate pleural effusion with associated subtotal left lung collapse.  Also cardiomegaly with reflux of contrast into IVC and hepatic veins. BNP elevated at 963 (1049 in 09/2021).  Troponin 23 > 2.  EKG shows atrial fibrillation.  Echo 10/2022 shows EF of 70 to 75%.  Reports compliance with 40 mg Lasix daily. Echo 01/13/2022 with EF 60-65%, no RWMA, severe LVH, severely reduced RVSF, severely elevated PASP Hold diuretics for now with low BP's overnight Strict I/O, daily weights Will consult cardiology   Supraclavicular lymphadenopathy Incidental CT findings - there are small mediastinal lymph nodes, and there is suspicion of an enlarged right supraclavicular node measuring 2.3 x 1.4 cm.   Radiology recommendation- consider further evaluation with neck CT, ideally after the patient's acute shortness of breath has resolved.  S/P Xi Robotic assisted Video Assisted Left Pericardial Window Creation Admitted 1/11 to 1/17, underwent Xi robotic  assisted right thoracoscopy, pericardial window on 1/11.   Pericardial biopsy showed mild chronic inflammation and focal fibrosis.  No malignancy or granulomas.  She followed up postdischarge with CTS- Dr. Kipp Brood, 1/20.  Patient was doing well.   - Subsequently followed-up with Dr. Conni Elliot 2/7, plan was to obtain limited echo for reevaluation and new baseline.  This has not been done.  -Obtain echo (as above)  Paroxysmal Leontyne Manville-fib (HCC) Currently in atrial fibrillation.   Mild RVR yesterday when metop was held - improved today Continue Eliquis and metoprolol (with holding parameters)  AKI (acute kidney injury) ([place]) Related to hypotension, diuresis Holding losartan, lasix - s/p bolus Follow UA - 30 mg/dl protein, no blood Follow renal US  Total bilirubin, elevated Hemodynamically mediated? Related to right heart failure? Indirect Overall, improved today Consider additional w/u as indicated.  Nausea & vomiting resolved  Type 2 diabetes mellitus (HCC) SSI   HTN (hypertension) Now on pressors Hold losartan  Hold metoprolol  Edema of right lower extremity Degree of chronicity? Follow US  DVT prophylaxis: eliquis Code Status: full Family Communication: none Disposition:   Status is: Inpatient Remains inpatient appropriate because: need for IV diuresis, cardiology c/s, IV abx   Consultants:  Cardiology IR  Procedures:  Echo IMPRESSIONS     1. Pericardial effuson gone since window performed.   2. RV function much worse compared to TTE 10/11/21.   3. Left ventricular ejection fraction, by estimation, is 60 to 65%. The  left ventricle has normal function. The left ventricle has no regional  wall motion abnormalities. There is severe left ventricular hypertrophy.  Left ventricular diastolic parameters   are indeterminate.   4. Right ventricular systolic function is severely reduced. The right  ventricular size is severely enlarged. There is severely elevated  pulmonary artery systolic pressure.   5. Left atrial size was  moderately dilated.   6. Right atrial size was severely dilated.   7. The mitral valve is abnormal. No evidence of mitral valve  regurgitation. No evidence of mitral stenosis.   8. The aortic valve is tricuspid. There is mild calcification of the  aortic valve. Aortic valve regurgitation is not visualized. Aortic valve  sclerosis is present, with no evidence of aortic valve stenosis.   9. The inferior vena cava is normal in size with greater than 50%  respiratory variability, suggesting right atrial pressure of 3 mmHg.   Antimicrobials:  Anti-infectives (From admission, onward)    Start     Dose/Rate Route Frequency Ordered Stop   01/13/22 0500  cefTRIAXone (ROCEPHIN) 2 g in sodium chloride 0.9 % 100 mL IVPB        2 g 200 mL/hr over 30 Minutes Intravenous Every 24 hours 01/08/2022 2104     02/01/2022 2115  azithromycin (ZITHROMAX) 500 mg in sodium chloride 0.9 % 250 mL IVPB        500 mg 250 mL/hr over 60 Minutes Intravenous Every 24 hours 01/15/2022 2104     01/26/2022 2030  vancomycin (VANCOREADY) IVPB 1750 mg/350 mL  Status:  Discontinued        1,750 mg 175 mL/hr over 120 Minutes Intravenous  Once 01/18/2022 1955 02/02/2022 2104   01/20/2022 1930  ceFEPIme (MAXIPIME) 2 g in sodium chloride 0.9 % 100 mL IVPB        2 g 200 mL/hr over 30 Minutes Intravenous  Once 01/29/2022 1927 01/11/2022 2031       Subjective: No new complaints  Objective: Vitals:   01/15/22  [id number] 01/15/22 0430 01/15/22 0445 01/15/22 0800  BP: 117/67 109/61 106/62   Pulse: 93 97 97   Resp: 19 19 (!) 22   Temp:    98.3 F (36.8 C)  TempSrc:    Oral  SpO2: 92% 94% 93%   Weight:      Height:        Intake/Output Summary (Last 24 hours) at 01/15/2022 0842 Last data filed at 01/14/2022 2130 Gross per 24 hour  Intake --  Output 350 ml  Net -350 ml   Filed Weights   01/24/2022 2337 01/13/22 0438 01/14/22 0456  Weight: 90.5 kg 90.6 kg 92 kg    Examination:  General: No acute distress.  Appears with more energy  today. Cardiovascular: irregularly irregular, rate controleld Lungs: unlabored, diminished Abdomen: Soft, nontender, nondistended Neurological: Alert and oriented 3. Moves all extremities 4. Cranial nerves II through XII grossly intact. Skin: Warm and dry. No rashes or lesions. Extremities: R>L edema (chronic per discussion?)   Data Reviewed: I have personally reviewed following labs and imaging studies  CBC: Recent Labs  Lab 02/01/2022 1549 01/13/22 0424 01/14/22 0424 01/15/22 0352  WBC 7.8 10.7* 10.7* 8.8  NEUTROABS 5.0  --  7.0 5.1  HGB 16.1* 14.8 13.9 14.0  HCT 50.8* 46.9* 45.6 45.5  MCV 95.1 98.5 100.0 99.1  PLT 232 205 205 0000000    Basic Metabolic Panel: Recent Labs  Lab 01/16/2022 1549 01/13/22 0424 01/14/22 0424 01/15/22 0352  NA 140 137 138 137  K 3.7 4.3 4.4 4.3  CL 103 105 105 104  CO2 25 21* 23 24  GLUCOSE 175* 129* 102* 107*  BUN 13 17 23  30*  CREATININE 0.91 0.99 1.35* 1.36*  CALCIUM 9.2 8.5* 8.4* 8.9  MG  --   --  1.8 1.8  PHOS  --   --  4.5 3.7    GFR: Estimated Creatinine Clearance: 44.1 mL/min (Ziyon Soltau) (by C-G formula based on SCr of 1.36 mg/dL (H)).  Liver Function Tests: Recent Labs  Lab 01/23/2022 1549 01/14/22 0424 01/15/22 0352  AST 16 10* 15  ALT 14 8 10   ALKPHOS 91 68 72  BILITOT 3.9* 2.0*   1.9* 1.4*  PROT 7.4 6.1* 6.1*  ALBUMIN 4.1 3.0* 2.9*    CBG: Recent Labs  Lab 01/14/22 0917 01/14/22 1113 01/14/22 1614 01/14/22 2120 01/15/22 0738  GLUCAP 119* 97 104* 94 92     Recent Results (from the past 240 hour(s))  Resp Panel by RT-PCR (Flu [place]&B, Covid) Nasopharyngeal Swab     Status: None   Collection Time: 01/11/2022  5:22 PM   Specimen: Nasopharyngeal Swab; Nasopharyngeal(NP) swabs in vial transport medium  Result Value Ref Range Status   SARS Coronavirus 2 by RT PCR NEGATIVE NEGATIVE Final    Comment: (NOTE) SARS-CoV-2 target nucleic acids are NOT DETECTED.  The SARS-CoV-2 RNA is generally detectable in upper  respiratory specimens during the acute phase of infection. The lowest concentration of SARS-CoV-2 viral copies this assay can detect is 138 copies/mL. [place]ha Behlke negative result does not preclude SARS-Cov-2 infection and should not be used as the sole basis for treatment or other patient management decisions. Celedonio Sortino negative result may occur with  improper specimen collection/handling, submission of specimen other than nasopharyngeal swab, presence of viral mutation(s) within the areas targeted by this assay, and inadequate number of viral copies(<138 copies/mL). Hutch Rhett negative result must be combined with clinical observations, patient history, and epidemiological information. The expected result is Negative.  Fact Sheet  for Patients:  EntrepreneurPulse.com.au  Fact Sheet for Healthcare Providers:  IncredibleEmployment.be  This test is no t yet approved or cleared by the [place] FDA and  has been authorized for detection and/or diagnosis of SARS-CoV-2 by FDA under an Emergency Use Authorization (EUA). This EUA will remain  in effect (meaning this test can be used) for the duration of the COVID-19 declaration under Section 564(b)(1) of the Act, 21 U.S.C.section 360bbb-3(b)(1), unless the authorization is terminated  or revoked sooner.       Influenza Dustyn Dansereau by PCR NEGATIVE NEGATIVE Final   Influenza B by PCR NEGATIVE NEGATIVE Final    Comment: (NOTE) The Xpert Xpress SARS-CoV-2/FLU/RSV plus assay is intended as an aid in the diagnosis of influenza from Nasopharyngeal swab specimens and should not be used as Benetta Maclaren sole basis for treatment. Nasal washings and aspirates are unacceptable for Xpert Xpress SARS-CoV-2/FLU/RSV testing.  Fact Sheet for Patients: EntrepreneurPulse.com.au  Fact Sheet for Healthcare Providers: IncredibleEmployment.be  This test is not yet approved or cleared by the [place] FDA and has been  authorized for detection and/or diagnosis of SARS-CoV-2 by FDA under an Emergency Use Authorization (EUA). This EUA will remain in effect (meaning this test can be used) for the duration of the COVID-19 declaration under Section 564(b)(1) of the Act, 21 U.S.C. section 360bbb-3(b)(1), unless the authorization is terminated or revoked.  Performed at [facility], [address] [place], [place] [address]   MRSA Next Gen by PCR, Nasal     Status: None   Collection Time: 01/24/2022 11:25 PM   Specimen: Nasal Mucosa; Nasal Swab  Result Value Ref Range Status   MRSA by PCR Next Gen NOT DETECTED NOT DETECTED Final    Comment: (NOTE) The GeneXpert MRSA Assay (FDA approved for NASAL specimens only), is one component of Velmer Woelfel comprehensive MRSA colonization surveillance program. It is not intended to diagnose MRSA infection nor to guide or monitor treatment for MRSA infections. Test performance is not FDA approved in patients less than 86 years old. Performed at [facility], [address] [place], [place] [address]   Gram stain     Status: None   Collection Time: 01/13/22 12:00 PM   Specimen: Pleura  Result Value Ref Range Status   Specimen Description PLEURAL RIGHT  Final   Special Requests NONE  Final   Gram Stain   Final    NO ORGANISMS SEEN WBC PRESENT,BOTH PMN AND MONONUCLEAR CYTOSPIN SMEAR Performed at [facility], [address], [place], [place] [address]    Report Status 01/13/2022 FINAL  Final  Culture, body fluid w Gram Stain-bottle     Status: None (Preliminary result)   Collection Time: 01/13/22 12:00 PM   Specimen: Pleura  Result Value Ref Range Status   Specimen Description PLEURAL RIGHT  Final   Special Requests   Final    BOTTLES DRAWN AEROBIC AND ANAEROBIC Blood Culture adequate volume   Culture   Final    NO GROWTH 2 DAYS Performed at [facility], [address], [place], [place] [address]    Report Status PENDING  Incomplete         Radiology Studies: DG  Chest 1 View  Result Date: 01/13/2022 CLINICAL DATA:  Post RIGHT thoracentesis EXAM: CHEST  1 VIEW COMPARISON:  01/11/2022 FINDINGS: Enlargement of cardiac silhouette. Mild atelectasis versus infiltrate at LEFT base. Significantly decreased RIGHT pleural effusion post thoracentesis. No pneumothorax. Atherosclerotic calcification aorta. IMPRESSION: No pneumothorax following RIGHT thoracentesis. Enlargement of cardiac silhouette with  mild persistent atelectasis or infiltrate at LEFT base. Electronically Signed   By: Lavonia Dana M.D.   On: 01/13/2022 12:44   DG CHEST PORT 1 VIEW  Result Date: 01/14/2022 CLINICAL DATA:  Chest pain starting 2 days ago. Midsternal. Shortness of breath and cough. EXAM: PORTABLE CHEST 1 VIEW COMPARISON:  01/13/2022 AP chest, AP chest and CT chest 01/07/2022 FINDINGS: Cardiac silhouette is again moderately to markedly enlarged. Mediastinal contours are unchanged. Calcification is again seen within aortic arch. There is again an epicardial fat pad overlying the left inferolateral hemithorax. No definite pleural effusion is seen on frontal view. Probable left basilar heterogeneous opacification. No pneumothorax. No acute skeletal abnormality. IMPRESSION:: IMPRESSION: 1. Moderately to markedly enlarged cardiac silhouette. 2. Left basilar heterogeneous airspace opacity, atelectasis versus pneumonia. 3. Again marked improvement in the pleural effusion seen on 01/11/2022 following 01/13/2022 thoracentesis. Electronically Signed   By: Yvonne Kendall M.D.   On: 01/14/2022 08:10   ECHOCARDIOGRAM COMPLETE  Result Date: 01/13/2022    ECHOCARDIOGRAM REPORT   Patient Name:   AHAVAH RIQUELME Date of Exam: 01/13/2022 Medical Rec #:  [id number]    Height:       62.0 in Accession #:    [id number]   Weight:       199.7 lb Date of Birth:  12-20-1955   BSA:          1.911 m Patient Age:    35 years     BP:           124/76 mmHg Patient Gender: F            HR:           104 bpm. Exam Location:  [facility]  Procedure: 2D Echo, Cardiac Doppler and Color Doppler Indications:    Acute Resipratory distress  History:        Patient has prior history of Echocardiogram examinations, most                 recent 10/11/2021. CHF, Arrythmias:Atrial Fibrillation,                 Signs/Symptoms:Dyspnea; Risk Factors:Hypertension. S/P                 Pericardial window creation.  Sonographer:    Wenda Low Referring Phys: [place]  1. Pericardial effuson gone since window performed.  2. RV function much worse compared to TTE 10/11/21.  3. Left ventricular ejection fraction, by estimation, is 60 to 65%. The left ventricle has normal function. The left ventricle has no regional wall motion abnormalities. There is severe left ventricular hypertrophy. Left ventricular diastolic parameters  are indeterminate.  4. Right ventricular systolic function is severely reduced. The right ventricular size is severely enlarged. There is severely elevated pulmonary artery systolic pressure.  5. Left atrial size was moderately dilated.  6. Right atrial size was severely dilated.  7. The mitral valve is abnormal. No evidence of mitral valve regurgitation. No evidence of mitral stenosis.  8. The aortic valve is tricuspid. There is mild calcification of the aortic valve. Aortic valve regurgitation is not visualized. Aortic valve sclerosis is present, with no evidence of aortic valve stenosis.  9. The inferior vena cava is normal in size with greater than 50% respiratory variability, suggesting right atrial pressure of 3 mmHg. FINDINGS  Left Ventricle: Left ventricular ejection fraction, by estimation, is 60 to 65%. The left ventricle has normal function. The left ventricle has no  regional wall motion abnormalities. The left ventricular internal cavity size was normal in size. There is  severe left ventricular hypertrophy. Left ventricular diastolic parameters are indeterminate. Right Ventricle: The right ventricular size  is severely enlarged. No increase in right ventricular wall thickness. Right ventricular systolic function is severely reduced. There is severely elevated pulmonary artery systolic pressure. The tricuspid regurgitant velocity is 3.38 m/s, and with an assumed right atrial pressure of 20 mmHg, the estimated right ventricular systolic pressure is [id number] mmHg. Left Atrium: Left atrial size was moderately dilated. Right Atrium: Right atrial size was severely dilated. Pericardium: There is no evidence of pericardial effusion. Mitral Valve: The mitral valve is abnormal. There is mild thickening of the mitral valve leaflet(s). There is mild calcification of the mitral valve leaflet(s). Mild mitral annular calcification. No evidence of mitral valve regurgitation. No evidence of mitral valve stenosis. MV peak gradient, 2.8 mmHg. The mean mitral valve gradient is 1.0 mmHg. Tricuspid Valve: The tricuspid valve is normal in structure. Tricuspid valve regurgitation is mild . No evidence of tricuspid stenosis. Aortic Valve: The aortic valve is tricuspid. There is mild calcification of the aortic valve. Aortic valve regurgitation is not visualized. Aortic valve sclerosis is present, with no evidence of aortic valve stenosis. Pulmonic Valve: The pulmonic valve was normal in structure. Pulmonic valve regurgitation is mild. No evidence of pulmonic stenosis. Aorta: The aortic root is normal in size and structure. Venous: The inferior vena cava is normal in size with greater than 50% respiratory variability, suggesting right atrial pressure of 3 mmHg. IAS/Shunts: No atrial level shunt detected by color flow Doppler. Additional Comments: Pericardial effuson gone since window performed. RV function much worse compared to TTE 10/11/21.  LEFT VENTRICLE PLAX 2D LVIDd:         3.60 cm   Diastology LVIDs:         1.40 cm   LV e' medial:    5.11 cm/s LV PW:         1.90 cm   LV E/e' medial:  14.8 LV IVS:        1.70 cm   LV e' lateral:   5.66 cm/s  LVOT diam:     2.00 cm   LV E/e' lateral: 13.4 LV SV:         40 LV SV Index:   21 LVOT Area:     3.14 cm  RIGHT VENTRICLE RV Basal diam:  4.45 cm RV Mid diam:    3.50 cm RV S prime:     7.94 cm/s LEFT ATRIUM             Index        RIGHT ATRIUM           Index LA diam:        5.00 cm 2.62 cm/m   RA Area:     38.50 cm LA Vol (A2C):   83.5 ml 43.70 ml/m  RA Volume:   153.00 ml 80.08 ml/m LA Vol (A4C):   84.4 ml 44.17 ml/m LA Biplane Vol: 91.8 ml 48.05 ml/m  AORTIC VALVE             PULMONIC VALVE LVOT Vmax:   73.10 cm/s  PV Vmax:       0.56 m/s LVOT Vmean:  39.200 cm/s PV Peak grad:  1.2 mmHg LVOT VTI:    0.126 m  AORTA Ao Root diam: 3.10 cm Ao Asc diam:  3.30 cm MITRAL VALVE  TRICUSPID VALVE MV Area (PHT): 5.23 cm    TR Peak grad:   45.7 mmHg MV Area VTI:   3.30 cm    TR Vmax:        338.00 cm/s MV Peak grad:  2.8 mmHg MV Mean grad:  1.0 mmHg    SHUNTS MV Vmax:       0.83 m/s    Systemic VTI:  0.13 m MV Vmean:      49.7 cm/s   Systemic Diam: 2.00 cm MV Decel Time: 145 msec MV E velocity: 75.80 cm/s Jenkins Rouge MD Electronically signed by Jenkins Rouge MD Signature Date/Time: 01/13/2022/11:35:59 AM    Final    [place] EKG SITE RITE  Result Date: 01/15/2022 If Site Rite image not attached, placement could not be confirmed due to current cardiac rhythm.  US THORACENTESIS ASP PLEURAL SPACE W/IMG GUIDE  Result Date: 01/13/2022 INDICATION: RIGHT pleural effusion EXAM: ULTRASOUND GUIDED DIAGNOSTIC AND THERAPEUTIC RIGHT THORACENTESIS MEDICATIONS: None. COMPLICATIONS: None immediate. PROCEDURE: An ultrasound guided thoracentesis was thoroughly discussed with the patient and questions answered. The benefits, risks, alternatives and complications were also discussed. The patient understands and wishes to proceed with the procedure. Written consent was obtained. Ultrasound was performed to localize and mark an adequate pocket of fluid in the RIGHT chest. The area was then prepped and draped in the normal  sterile fashion. 1% Lidocaine was used for local anesthesia. Under ultrasound guidance Rhea Thrun 8 French thoracentesis catheter was introduced. Thoracentesis was performed. The catheter was removed and Eliot Bencivenga dressing applied. FINDINGS: Vincenza Dail total of approximately 700 mL of dark yellow RIGHT pleural fluid was removed. Samples were sent to the laboratory as requested by the clinical team. IMPRESSION: Successful ultrasound guided RIGHT thoracentesis yielding 700 ML for high flow foot for of pleural fluid. Electronically Signed   By: Lavonia Dana M.D.   On: 01/13/2022 12:43        Scheduled Meds:  apixaban  5 mg Oral BID   atorvastatin  20 mg Oral QPM   Chlorhexidine Gluconate Cloth  6 each Topical Daily   guaiFENesin-dextromethorphan  10 mL Oral Q8H   insulin aspart  0-6 Units Subcutaneous TID WC   levalbuterol  0.63 mg Nebulization Once   metoprolol tartrate  12.5 mg Oral BID   midodrine  5 mg Oral TID WC   Continuous Infusions:  sodium chloride     sodium chloride 250 mL (01/15/22 0052)   azithromycin Stopped (01/14/22 2244)   cefTRIAXone (ROCEPHIN)  IV 2 g (01/15/22 0500)   norepinephrine (LEVOPHED) Adult infusion 2 mcg/min (01/15/22 0841)     LOS: 3 days    Time spent: over 30 min 40 min critical care time, on levophed   Fayrene Helper, MD [facility]   To contact the attending provider between 7A-7P or the covering provider during after hours 7P-7A, please log into the web site [email or web address] and access using universal [place] password for that web site. If you do not have the password, please call the hospital operator.  01/15/2022, 8:42 AM

## 2022-01-15 NOTE — Assessment & Plan Note (Signed)
Appreciate pulm recs, recommending RHC once acute issues resolved ?

## 2022-01-16 LAB — CBC WITH DIFFERENTIAL/PLATELET
Abs Immature Granulocytes: 0.04 10*3/uL (ref 0.00–0.07)
Basophils Absolute: 0 10*3/uL (ref 0.0–0.1)
Basophils Relative: 0 %
Eosinophils Absolute: 0.1 10*3/uL (ref 0.0–0.5)
Eosinophils Relative: 1 %
HCT: 43.6 % (ref 36.0–46.0)
Hemoglobin: 13.9 g/dL (ref 12.0–15.0)
Immature Granulocytes: 1 %
Lymphocytes Relative: 16 %
Lymphs Abs: 1.2 10*3/uL (ref 0.7–4.0)
MCH: 31.4 pg (ref 26.0–34.0)
MCHC: 31.9 g/dL (ref 30.0–36.0)
MCV: 98.4 fL (ref 80.0–100.0)
Monocytes Absolute: 1.1 10*3/uL — ABNORMAL HIGH (ref 0.1–1.0)
Monocytes Relative: 14 %
Neutro Abs: 5.6 10*3/uL (ref 1.7–7.7)
Neutrophils Relative %: 68 %
Platelets: 275 10*3/uL (ref 150–400)
RBC: 4.43 MIL/uL (ref 3.87–5.11)
RDW: 14.6 % (ref 11.5–15.5)
WBC: 8 10*3/uL (ref 4.0–10.5)
nRBC: 0 % (ref 0.0–0.2)

## 2022-01-16 LAB — GLUCOSE, CAPILLARY
Glucose-Capillary: 105 mg/dL — ABNORMAL HIGH (ref 70–99)
Glucose-Capillary: 132 mg/dL — ABNORMAL HIGH (ref 70–99)
Glucose-Capillary: 128 mg/dL — ABNORMAL HIGH (ref 70–99)
Glucose-Capillary: 110 mg/dL — ABNORMAL HIGH (ref 70–99)

## 2022-01-16 LAB — COMPREHENSIVE METABOLIC PANEL
ALT: 15 U/L (ref 0–44)
AST: 21 U/L (ref 15–41)
Albumin: 3 g/dL — ABNORMAL LOW (ref 3.5–5.0)
Alkaline Phosphatase: 96 U/L (ref 38–126)
Anion gap: 10 (ref 5–15)
BUN: 37 mg/dL — ABNORMAL HIGH (ref 8–23)
CO2: 23 mmol/L (ref 22–32)
Calcium: 8.9 mg/dL (ref 8.9–10.3)
Chloride: 103 mmol/L (ref 98–111)
Creatinine, Ser: 1.28 mg/dL — ABNORMAL HIGH (ref 0.44–1.00)
GFR, Estimated: 47 mL/min — ABNORMAL LOW (ref >60)
Glucose, Bld: 135 mg/dL — ABNORMAL HIGH (ref 70–99)
Potassium: 4.3 mmol/L (ref 3.5–5.1)
Sodium: 136 mmol/L (ref 135–145)
Total Bilirubin: 0.9 mg/dL (ref 0.3–1.2)
Total Protein: 6.2 g/dL — ABNORMAL LOW (ref 6.5–8.1)

## 2022-01-16 LAB — MAGNESIUM: Magnesium: 1.8 mg/dL (ref 1.7–2.4)

## 2022-01-16 LAB — PHOSPHORUS: Phosphorus: 3.7 mg/dL (ref 2.5–4.6)

## 2022-01-16 MED ORDER — INSULIN ASPART 100 UNIT/ML IJ SOLN
0.0000 [IU] | Freq: Three times a day (TID) | INTRAMUSCULAR | Status: DC
  Administered 2022-01-17: 1 [IU] via SUBCUTANEOUS

## 2022-01-16 MED ORDER — FUROSEMIDE 10 MG/ML IJ SOLN
20.0000 mg | Freq: Every day | INTRAMUSCULAR | Status: DC
  Administered 2022-01-16 – 2022-01-17 (×2): 20 mg via INTRAVENOUS
  Filled 2022-01-16 (×2): qty 2

## 2022-01-16 MED ORDER — LEVALBUTEROL HCL 0.63 MG/3ML IN NEBU
0.6300 mg | INHALATION_SOLUTION | Freq: Three times a day (TID) | RESPIRATORY_TRACT | Status: DC
  Administered 2022-01-16 – 2022-01-17 (×6): 0.63 mg via RESPIRATORY_TRACT
  Filled 2022-01-16 (×6): qty 3

## 2022-01-16 MED ORDER — MIDODRINE HCL 5 MG PO TABS
10.0000 mg | ORAL_TABLET | Freq: Three times a day (TID) | ORAL | Status: DC
  Administered 2022-01-16: 10 mg via ORAL
  Filled 2022-01-16 (×2): qty 2

## 2022-01-16 MED ORDER — INSULIN ASPART 100 UNIT/ML IJ SOLN: 0.0000 [IU] | Freq: Every day | INTRAMUSCULAR | Status: DC

## 2022-01-16 NOTE — Progress Notes (Signed)
Patient up to chair after breakfast, per Dr. Fredirick Lathe request. Patient able to ambulate from bed to chair with assistance. Patient sat up in chair for entirety of day until 6pm. Patient assisted back to bed from chair with assistance. Patient with increased work of breathing and very dyspneic with exertion. ?

## 2022-01-16 NOTE — TOC Progression Note (Signed)
Transition of Care (TOC) - Progression Note  ? ? ?Patient Details  ?Name: Tammy Small ?MRN: [id number] ?Date of Birth: Mar 09, 1956 ? ?Transition of Care (TOC) CM/SW Contact  ?Hetty Ely, RN ?Phone Number: ?01/16/2022, 3:44 PM ? ?Clinical Narrative:  NMS for discharge, per progression rounds maybe two days. TOC to continue to track.  ? ? ? ?  ?Barriers to Discharge: No Barriers Identified ? ?Expected Discharge Plan and Services ?  ?  ?  ?  ?  ?                ?  ?  ?  ?  ?  ?  ?  ?  ?  ?  ? ? ?Social Determinants of Health (SDOH) Interventions ?  ? ?Readmission Risk Interventions ?No flowsheet data found. ? ?

## 2022-01-16 NOTE — Progress Notes (Signed)
PROGRESS NOTE    Tammy ArmsFrances Small  ZOX:[id number] DOB: Jun 20, 1956 DOA: 01/26/2022 PCP: Jacquelin HawkingMcElroy, Shannon, PA-C  Chief Complaint  Patient presents with   Chest Pain    Brief Narrative:  66 yo with hx HFpEF, pericardial effusion now s/p pericardial window 11/2021, atrial fibrillation, HTN, T2DM who presented with chest pain and shortness of breath, found to have right sided pleural effusion with patchy airspace opacities.  Also concern for heart failure exacerbation.  She's been admitted for acute hypoxic respiratory failure, being treated for pneumonia, pleural effusion, and HF exacerbation.  See below for additional details    Assessment & Plan:   Principal Problem:   Acute respiratory failure with hypoxia (HCC) Active Problems:   Pneumonia   Parapneumonic Effusion   Hypotension   Acute exacerbation of CHF (congestive heart failure) (HCC)   Acute cor pulmonale (HCC)   Paroxysmal A-fib (HCC)   S/P Xi Robotic assisted Video Assisted Left Pericardial Window Creation   Supraclavicular lymphadenopathy   AKI (acute kidney injury) (HCC)   Nausea & vomiting   Total bilirubin, elevated   HTN (hypertension)   Type 2 diabetes mellitus (HCC)   Edema of right lower extremity   Assessment and Plan: * Acute respiratory failure with hypoxia (HCC) -Mostly due to pneumonia with parapneumonic effusion -Symptomatically improved since thora on 01/13/22--fluid analysis suggest parapneumonic effusion (Exudative by lights criteria. ) Continues with significant O2 requirement, on 13 L CXR 3/11 with mild improvement in left bibasilar infiltrate and stable small bilateral pleural effusion -Leukocytosis resolved -Continue Rocephin and azithromycin as well as mucolytics and bronchodilators -Strep and Legionella antigen negative -Right lower extremity Doppler without DVT  Hypotension Holding losartan Opiates contributing, will hold with improved pain Started on midodrine on 01/14/2022 -Weaned off IV  Levophed on 01/16/2022 - HFpEF/Acute cor pulmonale/patient with acute on chronic diastolic dysfunction CHF and concern for right heart failure Appreciate pulm recs, recommending RHC once acute issues resolved -Too unstable for RHC at this -Gentle diuresis with iv Lasix 20 mg daily as tolerated by blood pressure -Echo from 01/13/2022  with EF 60-65%, no RWMA, severe LVH, severely reduced RVSF, severely elevated PASP, no evidence of recurrence of pericardial effusion--patient previously had pericardial window in January 2023 -- Elevated BNP noted   Supraclavicular lymphadenopathy Incidental CT findings - there are small mediastinal lymph nodes, and there is suspicion of an enlarged right supraclavicular node measuring 2.3 x 1.4 cm.   Radiology recommendation- consider further evaluation with neck CT, ideally after the patient's acute shortness of breath has resolved.  Paroxysmal A-fib (HCC) -Rate control improved Continue Eliquis and metoprolol (with holding parameters)  AKI (acute kidney injury) (HCC) Related to hypotension, diuresis Holding losartan,  Follow UA - 30 mg/dl protein, no blood -Creatinine trended down  DM2- -A1c 6.8 reflecting good diabetic control PTA Use Novolog/Humalog Sliding scale insulin with Accu-Cheks/Fingersticks as ordered   DVT prophylaxis: eliquis Code Status: full Family Communication: none Disposition:   Status is: Inpatient Remains inpatient appropriate because: need for IV diuresis, cardiology c/s, IV abx   Consultants:  Cardiology IR  Procedures:  Echo IMPRESSIONS     1. Pericardial effuson gone since window performed.   2. RV function much worse compared to TTE 10/11/21.   3. Left ventricular ejection fraction, by estimation, is 60 to 65%. The  left ventricle has normal function. The left ventricle has no regional  wall motion abnormalities. There is severe left ventricular hypertrophy.  Left ventricular diastolic parameters   are  indeterminate.  4. Right ventricular systolic function is severely reduced. The right  ventricular size is severely enlarged. There is severely elevated  pulmonary artery systolic pressure.   5. Left atrial size was moderately dilated.   6. Right atrial size was severely dilated.   7. The mitral valve is abnormal. No evidence of mitral valve  regurgitation. No evidence of mitral stenosis.   8. The aortic valve is tricuspid. There is mild calcification of the  aortic valve. Aortic valve regurgitation is not visualized. Aortic valve  sclerosis is present, with no evidence of aortic valve stenosis.   9. The inferior vena cava is normal in size with greater than 50%  respiratory variability, suggesting right atrial pressure of 3 mmHg.   Antimicrobials:  Anti-infectives (From admission, onward)    Start     Dose/Rate Route Frequency Ordered Stop   01/13/22 0500  cefTRIAXone (ROCEPHIN) 2 g in sodium chloride 0.9 % 100 mL IVPB        2 g 200 mL/hr over 30 Minutes Intravenous Every 24 hours 2022/02/03 2104     Feb 03, 2022 2115  azithromycin (ZITHROMAX) 500 mg in sodium chloride 0.9 % 250 mL IVPB        500 mg 250 mL/hr over 60 Minutes Intravenous Every 24 hours 03-Feb-2022 2104     February 03, 2022 2030  vancomycin (VANCOREADY) IVPB 1750 mg/350 mL  Status:  Discontinued        1,750 mg 175 mL/hr over 120 Minutes Intravenous  Once 2022/02/03 1955 2022-02-03 2104   February 03, 2022 1930  ceFEPIme (MAXIPIME) 2 g in sodium chloride 0.9 % 100 mL IVPB        2 g 200 mL/hr over 30 Minutes Intravenous  Once 03-Feb-2022 1927 02-03-22 2031       Subjective: -No fever  Or chills   No Nausea, Vomiting or Diarrhea -- Happy to get out of bed and sit in the chair--but desaturates with minimal activity and with talking -Significant dyspnea on exertion with transferring from bed to chair   Objective: Vitals:   01/16/22 1300 01/16/22 1400 01/16/22 1430 01/16/22 1434  BP: 105/61 112/68 94/66   Pulse:      Resp: 19  (!) 23    Temp:      TempSrc:      SpO2:  94%  95%  Weight:      Height:        Intake/Output Summary (Last 24 hours) at 01/16/2022 1516 Last data filed at 01/16/2022 0916 Gross per 24 hour  Intake 1444.87 ml  Output --  Net 1444.87 ml   Filed Weights   03-Feb-2022 2337 01/13/22 0438 01/14/22 0456  Weight: 90.5 kg 90.6 kg 92 kg    Examination:  General: Appears comfortable overall, but has conversational dyspnea desaturates with conversation. Nose--- [place] 13L/min Cardiovascular: S1, S2, irregular but not irregularly irregular Lungs: Somewhat diminished, faint bibasilar rales Abdomen: Soft, nontender, non-distended, increased truncal adiposity Neurological: Alert and oriented 3. Moves all extremities 4. Cranial nerves II through XII grossly intact. Skin: Warm and dry. No rashes or lesions. Extremities: pitting edema, R>L edema (chronic per discussion?)  Data Reviewed: I have personally reviewed following labs and imaging studies  CBC: Recent Labs  Lab 2022-02-03 1549 01/13/22 0424 01/14/22 0424 01/15/22 0352 01/16/22 0356  WBC 7.8 10.7* 10.7* 8.8 8.0  NEUTROABS 5.0  --  7.0 5.1 5.6  HGB 16.1* 14.8 13.9 14.0 13.9  HCT 50.8* 46.9* 45.6 45.5 43.6  MCV 95.1 98.5 100.0 99.1 98.4  PLT  232 205 205 243 275    Basic Metabolic Panel: Recent Labs  Lab Jan 20, 2022 1549 01/13/22 0424 01/14/22 0424 01/15/22 0352 01/16/22 0356  NA 140 137 138 137 136  K 3.7 4.3 4.4 4.3 4.3  CL 103 105 105 104 103  CO2 25 21* 23 24 23   GLUCOSE 175* 129* 102* 107* 135*  BUN 13 17 23  30* 37*  CREATININE 0.91 0.99 1.35* 1.36* 1.28*  CALCIUM 9.2 8.5* 8.4* 8.9 8.9  MG  --   --  1.8 1.8 1.8  PHOS  --   --  4.5 3.7 3.7   GFR: Estimated Creatinine Clearance: 46.9 mL/min (A) (by C-G formula based on SCr of 1.28 mg/dL (H)).  Liver Function Tests: Recent Labs  Lab 2022-01-20 1549 01/14/22 0424 01/15/22 0352 01/16/22 0356  AST 16 10* 15 21  ALT 14 8 10 15   ALKPHOS 91 68 72 96  BILITOT 3.9* 2.0*   1.9*  1.4* 0.9  PROT 7.4 6.1* 6.1* 6.2*  ALBUMIN 4.1 3.0* 2.9* 3.0*    CBG: Recent Labs  Lab 01/15/22 1133 01/15/22 1619 01/15/22 2324 01/16/22 0704 01/16/22 1116  GLUCAP 109* 98 148* 105* 132*    Recent Results (from the past 240 hour(s))  Resp Panel by RT-PCR (Flu A&B, Covid) Nasopharyngeal Swab     Status: None   Collection Time: 01/20/22  5:22 PM   Specimen: Nasopharyngeal Swab; Nasopharyngeal(NP) swabs in vial transport medium  Result Value Ref Range Status   SARS Coronavirus 2 by RT PCR NEGATIVE NEGATIVE Final    Comment: (NOTE) SARS-CoV-2 target nucleic acids are NOT DETECTED.  The SARS-CoV-2 RNA is generally detectable in upper respiratory specimens during the acute phase of infection. The lowest concentration of SARS-CoV-2 viral copies this assay can detect is 138 copies/mL. A negative result does not preclude SARS-Cov-2 infection and should not be used as the sole basis for treatment or other patient management decisions. A negative result may occur with  improper specimen collection/handling, submission of specimen other than nasopharyngeal swab, presence of viral mutation(s) within the areas targeted by this assay, and inadequate number of viral copies(<138 copies/mL). A negative result must be combined with clinical observations, patient history, and epidemiological information. The expected result is Negative.  Fact Sheet for Patients:  03/18/22  Fact Sheet for Healthcare Providers:  03/18/22  This test is no t yet approved or cleared by the 03/14/22 FDA and  has been authorized for detection and/or diagnosis of SARS-CoV-2 by FDA under an Emergency Use Authorization (EUA). This EUA will remain  in effect (meaning this test can be used) for the duration of the COVID-19 declaration under Section 564(b)(1) of the Act, 21 U.S.C.section 360bbb-3(b)(1), unless the authorization is terminated  or  revoked sooner.       Influenza A by PCR NEGATIVE NEGATIVE Final   Influenza B by PCR NEGATIVE NEGATIVE Final    Comment: (NOTE) The Xpert Xpress SARS-CoV-2/FLU/RSV plus assay is intended as an aid in the diagnosis of influenza from Nasopharyngeal swab specimens and should not be used as a sole basis for treatment. Nasal washings and aspirates are unacceptable for Xpert Xpress SARS-CoV-2/FLU/RSV testing.  Fact Sheet for Patients: BloggerCourse.com  Fact Sheet for Healthcare Providers: SeriousBroker.it  This test is not yet approved or cleared by the [place] FDA and has been authorized for detection and/or diagnosis of SARS-CoV-2 by FDA under an Emergency Use Authorization (EUA). This EUA will remain in effect (meaning this test can be used)  for the duration of the COVID-19 declaration under Section 564(b)(1) of the Act, 21 U.S.C. section 360bbb-3(b)(1), unless the authorization is terminated or revoked.  Performed at [facility], [address], [place], [place] [address]   MRSA Next Gen by PCR, Nasal     Status: None   Collection Time: 02/01/2022 11:25 PM   Specimen: Nasal Mucosa; Nasal Swab  Result Value Ref Range Status   MRSA by PCR Next Gen NOT DETECTED NOT DETECTED Final    Comment: (NOTE) The GeneXpert MRSA Assay (FDA approved for NASAL specimens only), is one component of a comprehensive MRSA colonization surveillance program. It is not intended to diagnose MRSA infection nor to guide or monitor treatment for MRSA infections. Test performance is not FDA approved in patients less than 65 years old. Performed at [facility], [address] [place], [place] [address]   Gram stain     Status: None   Collection Time: 01/13/22 12:00 PM   Specimen: Pleura  Result Value Ref Range Status   Specimen Description PLEURAL RIGHT  Final   Special Requests NONE  Final   Gram Stain   Final    NO ORGANISMS SEEN WBC  PRESENT,BOTH PMN AND MONONUCLEAR CYTOSPIN SMEAR Performed at [facility], [address], [place], [place] [address]    Report Status 01/13/2022 FINAL  Final  Culture, body fluid w Gram Stain-bottle     Status: None (Preliminary result)   Collection Time: 01/13/22 12:00 PM   Specimen: Pleura  Result Value Ref Range Status   Specimen Description PLEURAL RIGHT  Final   Special Requests   Final    BOTTLES DRAWN AEROBIC AND ANAEROBIC Blood Culture adequate volume   Culture   Final    NO GROWTH 3 DAYS Performed at [facility], [address] [place], [place] [address]    Report Status PENDING  Incomplete     Radiology Studies: US Venous Img Lower Unilateral Right (DVT)  Result Date: 01/15/2022 CLINICAL DATA:  Right lower extremity edema. EXAM: RIGHT LOWER EXTREMITY VENOUS DOPPLER ULTRASOUND TECHNIQUE: Gray-scale sonography with graded compression, as well as color Doppler and duplex ultrasound were performed to evaluate the lower extremity deep venous systems from the level of the common femoral vein and including the common femoral, femoral, profunda femoral, popliteal and calf veins including the posterior tibial, peroneal and gastrocnemius veins when visible. The superficial great saphenous vein was also interrogated. Spectral Doppler was utilized to evaluate flow at rest and with distal augmentation maneuvers in the common femoral, femoral and popliteal veins. COMPARISON:  None. FINDINGS: Contralateral Common Femoral Vein: Respiratory phasicity is normal and symmetric with the symptomatic side. No evidence of thrombus. Normal compressibility. Common Femoral Vein: No evidence of thrombus. Normal compressibility, respiratory phasicity and response to augmentation. Saphenofemoral Junction: No evidence of thrombus. Normal compressibility and flow on color Doppler imaging. Profunda Femoral Vein: No evidence of thrombus. Normal compressibility and flow on color Doppler imaging. Femoral Vein: No  evidence of thrombus. Normal compressibility, respiratory phasicity and response to augmentation. Popliteal Vein: No evidence of thrombus. Normal compressibility, respiratory phasicity and response to augmentation. Calf Veins: No evidence of thrombus. Normal compressibility and flow on color Doppler imaging. Superficial Great Saphenous Vein: No evidence of thrombus. Normal compressibility. Venous Reflux:  None. Other Findings: No evidence of superficial thrombophlebitis or abnormal fluid collection. IMPRESSION: No evidence of lower extremity deep venous thrombosis. Electronically Signed   By: Irish Lack M.D.   On: 01/15/2022 09:43   DG CHEST  PORT 1 VIEW  Result Date: 01/15/2022 CLINICAL DATA:  Chest pain and shortness of breath. PICC line placement. EXAM: PORTABLE CHEST 1 VIEW COMPARISON:  Prior today FINDINGS: A new right arm PICC line is seen with tip overlying the superior cavoatrial junction. Cardiomegaly stable. Pulmonary vascular congestion is again seen. Infiltrate or atelectasis in the left lung base shows mild improvement since previous study. Small bilateral pleural effusions show no significant change. IMPRESSION: New right arm PICC line tip overlies the superior cavoatrial junction. Mild improvement in left basilar atelectasis versus infiltrate. Stable cardiomegaly and small bilateral pleural effusions. Electronically Signed   By: Danae Orleans M.D.   On: 01/15/2022 15:59   DG CHEST PORT 1 VIEW  Result Date: 01/15/2022 CLINICAL DATA:  Cough and shortness of breath.  Atrial fibrillation. EXAM: PORTABLE CHEST 1 VIEW COMPARISON:  01/14/2022 FINDINGS: Cardiomegaly and enlargement of pulmonary arteries shows no significant change. Worsening infiltrate is seen in the left lower lobe. Small bilateral pleural effusions, left side greater than right, remains stable. No pneumothorax visualized. IMPRESSION: Worsening left lower lobe infiltrate. Stable small bilateral pleural effusions. Stable  cardiomegaly and enlarged pulmonary arteries, suspicious for pulmonary artery hypertension. Electronically Signed   By: Danae Orleans M.D.   On: 01/15/2022 10:25   [place] EKG SITE RITE  Result Date: 01/15/2022 If Site Rite image not attached, placement could not be confirmed due to current cardiac rhythm.   Scheduled Meds:  apixaban  5 mg Oral BID   atorvastatin  20 mg Oral QPM   Chlorhexidine Gluconate Cloth  6 each Topical Daily   furosemide  20 mg Intravenous Daily   guaiFENesin-dextromethorphan  10 mL Oral Q8H   insulin aspart  0-6 Units Subcutaneous TID WC   levalbuterol  0.63 mg Nebulization TID   metoprolol tartrate  25 mg Oral BID   midodrine  5 mg Oral TID WC   Continuous Infusions:  sodium chloride     sodium chloride 10 mL/hr at 01/16/22 0916   azithromycin Stopped (01/15/22 2239)   cefTRIAXone (ROCEPHIN)  IV Stopped (01/16/22 0448)   norepinephrine (LEVOPHED) Adult infusion Stopped (01/16/22 0909)     LOS: 4 days   Shon Hale, MD [facility]   To contact the attending provider between 7A-7P or the covering provider during after hours 7P-7A, please log into the web site [email or web address] and access using universal [place] password for that web site. If you do not have the password, please call the hospital operator.  01/16/2022, 3:16 PM

## 2022-01-17 ENCOUNTER — Inpatient Hospital Stay (HOSPITAL_COMMUNITY): Payer: Self-pay

## 2022-01-17 DIAGNOSIS — J90 Pleural effusion, not elsewhere classified: Secondary | ICD-10-CM

## 2022-01-17 DIAGNOSIS — I5032 Chronic diastolic (congestive) heart failure: Secondary | ICD-10-CM

## 2022-01-17 DIAGNOSIS — I959 Hypotension, unspecified: Secondary | ICD-10-CM

## 2022-01-17 DIAGNOSIS — J189 Pneumonia, unspecified organism: Secondary | ICD-10-CM

## 2022-01-17 DIAGNOSIS — Z9889 Other specified postprocedural states: Secondary | ICD-10-CM

## 2022-01-17 LAB — GLUCOSE, CAPILLARY
Glucose-Capillary: 105 mg/dL — ABNORMAL HIGH (ref 70–99)
Glucose-Capillary: 114 mg/dL — ABNORMAL HIGH (ref 70–99)
Glucose-Capillary: 147 mg/dL — ABNORMAL HIGH (ref 70–99)
Glucose-Capillary: 145 mg/dL — ABNORMAL HIGH (ref 70–99)

## 2022-01-17 LAB — COOXEMETRY PANEL
Carboxyhemoglobin: <0.3 % — ABNORMAL LOW (ref 0.5–1.5)
Methemoglobin: <0.7 % (ref 0.0–1.5)
O2 Saturation: 72.2 %
Total hemoglobin: 8.4 g/dL — ABNORMAL LOW (ref 12.0–16.0)

## 2022-01-17 LAB — BLOOD GAS, ARTERIAL
Acid-base deficit: 1.4 mmol/L (ref 0.0–2.0)
Bicarbonate: 22.8 mmol/L (ref 20.0–28.0)
Drawn by: 22179
FIO2: 100 %
O2 Saturation: 87.1 %
Patient temperature: 36.8
pCO2 arterial: 36 mmHg (ref 32–48)
pH, Arterial: 7.41 (ref 7.35–7.45)
pO2, Arterial: 52 mmHg — ABNORMAL LOW (ref 83–108)

## 2022-01-17 LAB — CBC
HCT: 44 % (ref 36.0–46.0)
Hemoglobin: 14 g/dL (ref 12.0–15.0)
MCH: 31.1 pg (ref 26.0–34.0)
MCHC: 31.8 g/dL (ref 30.0–36.0)
MCV: 97.8 fL (ref 80.0–100.0)
Platelets: 284 10*3/uL (ref 150–400)
RBC: 4.5 MIL/uL (ref 3.87–5.11)
RDW: 14.5 % (ref 11.5–15.5)
WBC: 6.7 10*3/uL (ref 4.0–10.5)
nRBC: 0 % (ref 0.0–0.2)

## 2022-01-17 LAB — BASIC METABOLIC PANEL
Anion gap: 10 (ref 5–15)
BUN: 43 mg/dL — ABNORMAL HIGH (ref 8–23)
CO2: 23 mmol/L (ref 22–32)
Calcium: 8.8 mg/dL — ABNORMAL LOW (ref 8.9–10.3)
Chloride: 103 mmol/L (ref 98–111)
Creatinine, Ser: 1.44 mg/dL — ABNORMAL HIGH (ref 0.44–1.00)
GFR, Estimated: 41 mL/min — ABNORMAL LOW (ref >60)
Glucose, Bld: 120 mg/dL — ABNORMAL HIGH (ref 70–99)
Potassium: 4.2 mmol/L (ref 3.5–5.1)
Sodium: 136 mmol/L (ref 135–145)

## 2022-01-17 LAB — MISC LABCORP TEST (SEND OUT)

## 2022-01-17 LAB — CYTOLOGY - NON PAP

## 2022-01-17 MED ORDER — AMIODARONE HCL IN DEXTROSE 360-4.14 MG/200ML-% IV SOLN
60.0000 mg/h | INTRAVENOUS | Status: AC
Start: 2022-01-17 — End: 2022-01-17
  Administered 2022-01-17 (×2): 60 mg/h via INTRAVENOUS
  Filled 2022-01-17: qty 200

## 2022-01-17 MED ORDER — AMIODARONE HCL IN DEXTROSE 360-4.14 MG/200ML-% IV SOLN
30.0000 mg/h | INTRAVENOUS | Status: DC
Start: 2022-01-17 — End: 2022-01-18
  Administered 2022-01-17 (×2): 30 mg/h via INTRAVENOUS
  Filled 2022-01-17 (×2): qty 200

## 2022-01-17 MED ORDER — ENOXAPARIN SODIUM 100 MG/ML IJ SOSY
100.0000 mg | PREFILLED_SYRINGE | Freq: Two times a day (BID) | INTRAMUSCULAR | Status: DC
  Administered 2022-01-17: 100 mg via SUBCUTANEOUS
  Filled 2022-01-17: qty 1

## 2022-01-17 MED ORDER — ENOXAPARIN SODIUM 40 MG/0.4ML IJ SOSY: 40.0000 mg | PREFILLED_SYRINGE | Freq: Once | INTRAMUSCULAR | Status: DC

## 2022-01-17 MED ORDER — SPIRONOLACTONE 25 MG PO TABS
25.0000 mg | ORAL_TABLET | Freq: Two times a day (BID) | ORAL | Status: DC
  Administered 2022-01-17: 25 mg via ORAL
  Filled 2022-01-17: qty 1

## 2022-01-17 MED ORDER — AMIODARONE LOAD VIA INFUSION
150.0000 mg | Freq: Once | INTRAVENOUS | Status: AC
  Administered 2022-01-17: 150 mg via INTRAVENOUS
  Filled 2022-01-17: qty 83.34

## 2022-01-17 MED ORDER — MORPHINE SULFATE (PF) 2 MG/ML IV SOLN
2.0000 mg | INTRAVENOUS | Status: DC | PRN
  Administered 2022-01-17 (×4): 2 mg via INTRAVENOUS
  Filled 2022-01-17 (×4): qty 1

## 2022-01-17 MED ORDER — ONDANSETRON HCL 4 MG/2ML IJ SOLN
4.0000 mg | Freq: Once | INTRAMUSCULAR | Status: AC
  Administered 2022-01-17: 4 mg via INTRAVENOUS

## 2022-01-17 MED ORDER — FUROSEMIDE 10 MG/ML IJ SOLN
40.0000 mg | Freq: Once | INTRAMUSCULAR | Status: AC
  Administered 2022-01-17: 40 mg via INTRAVENOUS
  Filled 2022-01-17: qty 4

## 2022-01-17 NOTE — Progress Notes (Signed)
?-  Cardiology and pulmonology consults appreciated ?- ?Patient and family requesting no further escalation of care ?- ?Patient requesting morphine for comfort ?- ?On high flow nasal cannula oxygen would not bring her back ? ?-Patient reluctant to use BiPAP because she she states makes her uncomfortable ? ?-Patient had conference with family members ?- ?She is leaning towards transitioning to comfort care ?- ?She required a DNR/DNI ?- ?Continue current level of care without further escalation per patient and family request ?- ?Patient's daughter and mother at bedside ? ?-Patient remains critically ill ?- ?Overall prognosis is grave ? ?-Anticipate in-hospital death ? ?Roxan Hockey, MD ? ?

## 2022-01-17 NOTE — Progress Notes (Signed)
PROGRESS NOTE    Tammy Small  [id number] DOB: [birth year] DOA: 02/04/2022 PCP: Soyla Dryer, PA-C  Chief Complaint  Patient presents with   Chest Pain    Brief Narrative:  66 yo with hx HFpEF, pericardial effusion now s/p pericardial window 11/2021, atrial fibrillation, HTN, T2DM who presented with chest pain and shortness of breath, found to have right sided pleural effusion with patchy airspace opacities.  Also concern for heart failure exacerbation.  She's been admitted for acute hypoxic respiratory failure, being treated for pneumonia, pleural effusion, and HF exacerbation.  See below for additional details    Assessment & Plan:   Principal Problem:   Acute respiratory failure with hypoxia (HCC) Active Problems:   Pneumonia   Parapneumonic Effusion   Hypotension   Acute exacerbation of CHF (congestive heart failure) (HCC)   Acute cor pulmonale (HCC)   Paroxysmal A-fib (HCC)   S/P Xi Robotic assisted Video Assisted Left Pericardial Window Creation   Supraclavicular lymphadenopathy   AKI (acute kidney injury) (HCC)   Nausea & vomiting   Total bilirubin, elevated   HTN (hypertension)   Type 2 diabetes mellitus (HCC)   Edema of right lower extremity   HCAP (healthcare-associated pneumonia)   Pleural effusion   Assessment and Plan: * Acute respiratory failure with hypoxia ([place]) -Mostly due to pneumonia with parapneumonic effusion -Symptomatically improved since thora on 01/13/22--fluid analysis suggest parapneumonic effusion (Exudative by lights criteria. ) Continues with significant O2 requirement, on 15L/min/NRB-- -Patient has refused further BiPAP CXR 3/11 with mild improvement in left bibasilar infiltrate and stable small bilateral pleural effusion -Leukocytosis resolved -Treated with Rocephin and azithromycin as well as mucolytics and bronchodilators -Strep and Legionella antigen negative -Right lower extremity Doppler without DVT  Hypotension Holding  losartan Opiates contributing, will hold with improved pain Started on midodrine on 01/14/2022 -Weaned off IV Levophed on 01/16/2022 - HFpEF/Acute cor pulmonale/patient with acute on chronic diastolic dysfunction CHF and concern for right heart failure Appreciate pulm recs, recommending RHC once acute issues resolved -Too unstable for RHC at this -Echo from 01/13/2022  with EF 60-65%, no RWMA, severe LVH, severely reduced RVSF, severely elevated PASP, no evidence of recurrence of pericardial effusion--patient previously had pericardial window in January 2023 -- Elevated BNP noted   Supraclavicular lymphadenopathy Incidental CT findings - there are small mediastinal lymph nodes, and there is suspicion of an enlarged right supraclavicular node measuring 2.3 x 1.4 cm.   Radiology recommendation- consider further evaluation with neck CT, ideally after the patient's acute shortness of breath has resolved.  Paroxysmal A-fib (HCC) -Rate control improved Continue Eliquis and metoprolol (with holding parameters)  AKI (acute kidney injury) (HCC) Related to hypotension, diuresis Holding losartan,  Follow UA - 30 mg/dl protein, no blood -Creatinine trended down  DM2- -A1c 6.8 reflecting good diabetic control PTA Use Novolog/Humalog Sliding scale insulin with Accu-Cheks/Fingersticks as ordered   Social/Ethics- Cardiology and pulmonology consults appreciated -Patient and family requesting no further escalation of care -Patient requesting morphine for comfort -On high flow nasal cannula oxygen would not bring her back  -Patient reluctant to use BiPAP because she she states makes her uncomfortable  -Patient had conference with family members -She is leaning towards transitioning to comfort care -She required a DNR/DNI -Continue current level of care without further escalation per patient and family request -Patient's daughter and mother at bedside  -Patient remains critically ill -Overall  prognosis is grave  -Anticipate in-hospital death  DVT prophylaxis: eliquis Code Status: DNR Family Communication: Discussed  with patient's daughter and mother at bedside Disposition:   Status is: Inpatient Remains inpatient appropriate because: need for IV diuresis, cardiology c/s, IV abx   Consultants:  Cardiology IR  Procedures:  Echo IMPRESSIONS     1. Pericardial effuson gone since window performed.   2. RV function much worse compared to TTE 10/11/21.   3. Left ventricular ejection fraction, by estimation, is 60 to 65%. The  left ventricle has normal function. The left ventricle has no regional  wall motion abnormalities. There is severe left ventricular hypertrophy.  Left ventricular diastolic parameters   are indeterminate.   4. Right ventricular systolic function is severely reduced. The right  ventricular size is severely enlarged. There is severely elevated  pulmonary artery systolic pressure.   5. Left atrial size was moderately dilated.   6. Right atrial size was severely dilated.   7. The mitral valve is abnormal. No evidence of mitral valve  regurgitation. No evidence of mitral stenosis.   8. The aortic valve is tricuspid. There is mild calcification of the  aortic valve. Aortic valve regurgitation is not visualized. Aortic valve  sclerosis is present, with no evidence of aortic valve stenosis.   9. The inferior vena cava is normal in size with greater than 50%  respiratory variability, suggesting right atrial pressure of 3 mmHg.   Antimicrobials:  Anti-infectives (From admission, onward)    Start     Dose/Rate Route Frequency Ordered Stop   01/13/22 0500  cefTRIAXone (ROCEPHIN) 2 g in sodium chloride 0.9 % 100 mL IVPB        2 g 200 mL/hr over 30 Minutes Intravenous Every 24 hours 01/22/2022 2104     02/01/2022 2115  azithromycin (ZITHROMAX) 500 mg in sodium chloride 0.9 % 250 mL IVPB        500 mg 250 mL/hr over 60 Minutes Intravenous Every 24 hours  01/24/2022 2104     01/18/2022 2030  vancomycin (VANCOREADY) IVPB 1750 mg/350 mL  Status:  Discontinued        1,750 mg 175 mL/hr over 120 Minutes Intravenous  Once 01/16/2022 1955 02/04/2022 2104   01/08/2022 1930  ceFEPIme (MAXIPIME) 2 g in sodium chloride 0.9 % 100 mL IVPB        2 g 200 mL/hr over 30 Minutes Intravenous  Once 01/07/2022 1927 01/13/2022 2031       Subjective: -No fever  Or chills   Cardiology and pulmonology consults appreciated -Patient and family requesting no further escalation of care -Patient requesting morphine for comfort -On high flow nasal cannula oxygen would not bring her back  -Patient reluctant to use BiPAP because she she states makes her uncomfortable  -Patient had conference with family members -She is leaning towards transitioning to comfort care -She required a DNR/DNI -Continue current level of care without further escalation per patient and family request -Patient's daughter and mother at bedside  -Patient remains critically ill -Overall prognosis is grave  -Anticipate in-hospital death   Objective: Vitals:   January 21, 2022 1730 01-21-22 1800 01-21-2022 1830 01-21-22 1855  BP: 130/83 (!) 143/92 135/85   Pulse: 90 87 89 91  Resp: (!) 23 (!) 25 (!) 26 (!) 23  Temp:      TempSrc:      SpO2: (!) 69% (!) 66% (!) 60% (!) 71%  Weight:      Height:        Intake/Output Summary (Last 24 hours) at Jan 21, 2022 1929 Last data filed at 01-21-2022 1836 Gross  per 24 hour  Intake 1019.48 ml  Output 550 ml  Net 469.48 ml   Filed Weights   01/13/22 0438 01/14/22 0456 01/17/22 0500  Weight: 90.6 kg 92 kg 98.1 kg    Examination:  General: Appears uncomfortable with conversational dyspnea and persistent hypoxia see me Nose--- Rives 15L/min/NRB Cardiovascular: S1, S2, irregular but not irregularly irregular Lungs: diminished, faint bibasilar rales Abdomen: Soft, nontender, non-distended, increased truncal adiposity Neurological: Alert and oriented 3. Moves all  extremities 4. Cranial nerves II through XII grossly intact. Psych-affect is appropriate Skin: Warm and dry. No rashes or lesions. Extremities: pitting edema, R>L edema (chronic per discussion?)  CBC: Recent Labs  Lab 01/13/2022 1549 01/13/22 0424 01/14/22 0424 01/15/22 0352 01/16/22 0356 01/17/22 0425  WBC 7.8 10.7* 10.7* 8.8 8.0 6.7  NEUTROABS 5.0  --  7.0 5.1 5.6  --   HGB 16.1* 14.8 13.9 14.0 13.9 14.0  HCT 50.8* 46.9* 45.6 45.5 43.6 44.0  MCV 95.1 98.5 100.0 99.1 98.4 97.8  PLT 232 205 205 243 275 [id number]    Basic Metabolic Panel: Recent Labs  Lab 01/13/22 0424 01/14/22 0424 01/15/22 0352 01/16/22 0356 01/17/22 0425  NA 137 138 137 136 136  K 4.3 4.4 4.3 4.3 4.2  CL 105 105 104 103 103  CO2 21* 23 24 23 23   GLUCOSE 129* 102* 107* 135* 120*  BUN 17 23 30* 37* 43*  CREATININE 0.99 1.35* 1.36* 1.28* 1.44*  CALCIUM 8.5* 8.4* 8.9 8.9 8.8*  MG  --  1.8 1.8 1.8  --   PHOS  --  4.5 3.7 3.7  --    GFR: Estimated Creatinine Clearance: 43.2 mL/min (A) (by C-G formula based on SCr of 1.44 mg/dL (H)).  Liver Function Tests: Recent Labs  Lab 01/11/2022 1549 01/14/22 0424 01/15/22 0352 01/16/22 0356  AST 16 10* 15 21  ALT 14 8 10 15   ALKPHOS 91 68 72 96  BILITOT 3.9* 2.0*   1.9* 1.4* 0.9  PROT 7.4 6.1* 6.1* 6.2*  ALBUMIN 4.1 3.0* 2.9* 3.0*    CBG: Recent Labs  Lab 01/16/22 1619 01/16/22 2133 01/17/22 0743 01/17/22 1150 01/17/22 1649  GLUCAP 128* 110* 105* 114* 147*    Recent Results (from the past 240 hour(s))  Resp Panel by RT-PCR (Flu A&B, Covid) Nasopharyngeal Swab     Status: None   Collection Time: 01/31/2022  5:22 PM   Specimen: Nasopharyngeal Swab; Nasopharyngeal(NP) swabs in vial transport medium  Result Value Ref Range Status   SARS Coronavirus 2 by RT PCR NEGATIVE NEGATIVE Final    Comment: (NOTE) SARS-CoV-2 target nucleic acids are NOT DETECTED.  The SARS-CoV-2 RNA is generally detectable in upper respiratory specimens during the acute phase of  infection. The lowest concentration of SARS-CoV-2 viral copies this assay can detect is 138 copies/mL. A negative result does not preclude SARS-Cov-2 infection and should not be used as the sole basis for treatment or other patient management decisions. A negative result may occur with  improper specimen collection/handling, submission of specimen other than nasopharyngeal swab, presence of viral mutation(s) within the areas targeted by this assay, and inadequate number of viral copies(<138 copies/mL). A negative result must be combined with clinical observations, patient history, and epidemiological information. The expected result is Negative.  Fact Sheet for Patients:  EntrepreneurPulse.com.au  Fact Sheet for Healthcare Providers:  IncredibleEmployment.be  This test is no t yet approved or cleared by the [place] FDA and  has been authorized for detection and/or diagnosis  of SARS-CoV-2 by FDA under an Emergency Use Authorization (EUA). This EUA will remain  in effect (meaning this test can be used) for the duration of the COVID-19 declaration under Section 564(b)(1) of the Act, 21 U.S.C.section 360bbb-3(b)(1), unless the authorization is terminated  or revoked sooner.       Influenza A by PCR NEGATIVE NEGATIVE Final   Influenza B by PCR NEGATIVE NEGATIVE Final    Comment: (NOTE) The Xpert Xpress SARS-CoV-2/FLU/RSV plus assay is intended as an aid in the diagnosis of influenza from Nasopharyngeal swab specimens and should not be used as a sole basis for treatment. Nasal washings and aspirates are unacceptable for Xpert Xpress SARS-CoV-2/FLU/RSV testing.  Fact Sheet for Patients: EntrepreneurPulse.com.au  Fact Sheet for Healthcare Providers: IncredibleEmployment.be  This test is not yet approved or cleared by the [place] FDA and has been authorized for detection and/or diagnosis of SARS-CoV-2  by FDA under an Emergency Use Authorization (EUA). This EUA will remain in effect (meaning this test can be used) for the duration of the COVID-19 declaration under Section 564(b)(1) of the Act, 21 U.S.C. section 360bbb-3(b)(1), unless the authorization is terminated or revoked.  Performed at [facility], [address], [place], [place] [address]   MRSA Next Gen by PCR, Nasal     Status: None   Collection Time: 01/29/2022 11:25 PM   Specimen: Nasal Mucosa; Nasal Swab  Result Value Ref Range Status   MRSA by PCR Next Gen NOT DETECTED NOT DETECTED Final    Comment: (NOTE) The GeneXpert MRSA Assay (FDA approved for NASAL specimens only), is one component of a comprehensive MRSA colonization surveillance program. It is not intended to diagnose MRSA infection nor to guide or monitor treatment for MRSA infections. Test performance is not FDA approved in patients less than 77 years old. Performed at [facility], [address] [place], [place] [address]   Gram stain     Status: None   Collection Time: 01/13/22 12:00 PM   Specimen: Pleura  Result Value Ref Range Status   Specimen Description PLEURAL RIGHT  Final   Special Requests NONE  Final   Gram Stain   Final    NO ORGANISMS SEEN WBC PRESENT,BOTH PMN AND MONONUCLEAR CYTOSPIN SMEAR Performed at [facility], [address] [place], [place] [address]    Report Status 01/13/2022 FINAL  Final  Culture, body fluid w Gram Stain-bottle     Status: None (Preliminary result)   Collection Time: 01/13/22 12:00 PM   Specimen: Pleura  Result Value Ref Range Status   Specimen Description PLEURAL RIGHT  Final   Special Requests   Final    BOTTLES DRAWN AEROBIC AND ANAEROBIC Blood Culture adequate volume   Culture   Final    NO GROWTH 4 DAYS Performed at [facility], [address], [place], [place] [address]    Report Status PENDING  Incomplete     Radiology Studies: DG CHEST PORT 1 VIEW  Result Date: 01/17/2022 CLINICAL DATA:   Shortness of breath.  Pneumonia. EXAM: PORTABLE CHEST 1 VIEW COMPARISON:  01/15/22 FINDINGS: Right arm PICC line tip is in the right atrium. Stable cardiomediastinal contours. Bilateral pleural effusions and mild interstitial edema is unchanged from previous exam. IMPRESSION: Stable CHF pattern. Electronically Signed   By: Kerby Moors M.D.   On: 01/17/2022 07:21    Scheduled Meds:  apixaban  5 mg Oral BID   atorvastatin  20 mg Oral QPM   Chlorhexidine Gluconate Cloth  6 each  Topical Daily   guaiFENesin-dextromethorphan  10 mL Oral Q8H   insulin aspart  0-5 Units Subcutaneous QHS   insulin aspart  0-6 Units Subcutaneous TID WC   levalbuterol  0.63 mg Nebulization TID   spironolactone  25 mg Oral BID   Continuous Infusions:  sodium chloride     sodium chloride 10 mL/hr at 01/17/22 1514   amiodarone 30 mg/hr (01/17/22 1523)   azithromycin Stopped (01/16/22 2240)   cefTRIAXone (ROCEPHIN)  IV Stopped (01/17/22 [id number])   norepinephrine (LEVOPHED) Adult infusion Stopped (01/16/22 0909)    LOS: 5 days   Roxan Hockey, MD [facility]   To contact the attending provider between 7A-7P or the covering provider during after hours 7P-7A, please log into the web site [email or web address] and access using universal [place] password for that web site. If you do not have the password, please call the hospital operator.  01/17/2022, 7:29 PM

## 2022-01-17 NOTE — Procedures (Deleted)
? ? ? ?  Transesophageal Echocardiogram Note ? ?Tammy Small ?[id number] ?1956/09/09 ? ?Procedure: Transesophageal Echocardiogram ?Indications: Stroke ? ?Procedure Details ?Consent: Obtained ?Time Out: Verified patient identification, verified procedure, site/side was marked, verified correct patient position, special equipment/implants available, Radiology Safety Procedures followed,  medications/allergies/relevent history reviewed, required imaging and test results available.  Performed ? ?Medications: ?Propofol: 80mg  ?Etomidate: 4mg  ?Administered by anesthesia ? ?Left Ventrical:  LVEF 50% ? ?Mitral Valve: Trivial MR ? ?Aortic Valve: Trivial AI ? ?Tricuspid Valve: Trivial TR ? ?Pulmonic Valve: Mild PI ? ?Left Atrium/ Left atrial appendage: No evidence of LAA thrombus ? ?Atrial septum: PFO is demonstrated by color flow and agitated saline bubble study ? ?Aorta: Significant plaquing visualized ? ? ?Complications: No apparent complications ?Patient did tolerate procedure well. ? ?Freada Bergeron, MD ?01/17/2022, 11:46 AM  ?

## 2022-01-17 NOTE — Progress Notes (Signed)
ANTICOAGULATION CONSULT NOTE - Initial Consult ? ?Pharmacy Consult for Lovenox ?Indication: atrial fibrillation ? ?No Known Allergies ? ?Patient Measurements: ?Height: 5\' 2"  (157.5 cm) ?Weight: 98.1 kg (216 lb 4.3 oz) ? ? ?Vital Signs: ?Temp: 97.1 ?F (36.2 ?C) (03/13 2056) ?Temp Source: Axillary (03/13 2056) ?BP: 115/77 (03/13 2030) ?Pulse Rate: 91 (03/13 2030) ? ?Labs: ?Recent Labs  ?  01/15/22 ?0352 01/16/22 ?0356 01/17/22 ?0425  ?HGB 14.0 13.9 14.0  ?HCT 45.5 43.6 44.0  ?PLT 243 275 284  ?CREATININE 1.36* 1.28* 1.44*  ? ? ?Estimated Creatinine Clearance: 43.2 mL/min (A) (by C-G formula based on SCr of 1.44 mg/dL (H)). ? ? ?Medical History: ?Past Medical History:  ?Diagnosis Date  ? (HFpEF) heart failure with preserved ejection fraction ([place])   ? CKD (chronic kidney disease)   ? Essential hypertension   ? Hyperlipidemia   ? PAF (paroxysmal atrial fibrillation) ([place])   ? Pericardial effusion   ? Pneumonia   ? Type 2 diabetes mellitus ([place])   ? ? ?Medications:  ?Eliquis 5mg  BID PTA ? ?Assessment: ?66 year old female hx Afib on eliquis. MD converting to full dose lovenox due to difficulty with oral intake. Last dose of apixaban was at 9am.  ? ?Goal of Therapy:  ?Monitor platelets by anticoagulation protocol: Yes ?  ?Plan:  ?Lovenox 1mg /kg q 12h = 100mg  q 12 ?Monitor renal function closely in setting of organ failuer. ?Monitory CBCs  and clinical status ? ?Lorenso Courier Chika ?01/17/2022,9:17 PM ? ? ?

## 2022-01-17 NOTE — Progress Notes (Signed)
RN called due to patient persistent hypoxia with O2 sat in the 60s despite 100% BiPAP and with HR in the high 40s and low 50s despite as needed morphine. At bedside, patient was noted with increased work of breathing with the use of accessory muscles and with above findings. There was a discussion during the day of possible transitioning to comfort care.  The only option to alleviate patient's respiratory distress is intubation, which is not feasible due to her persistent DNR status. Patient's daughter was updated regarding patient's current status, daughter wants patient to be transitioned to comfort care, this was witnessed by the [employer], however, she wants to be at bedside prior to transitioning.  We shall continue to monitor patient and patient will be transitioned to comfort care, after patient's daughter's arrival to bedside and direction to initiate the change in status.

## 2022-01-17 NOTE — Progress Notes (Signed)
Name: Tammy Small MRN: [id number] DOB: [birth year]    ADMISSION DATE:  01/30/2022 CONSULTATION DATE:  01/17/2022   REFERRING MD :  Clair Gulling  CHIEF COMPLAINT: Shortness of breath, pleural effusion   HISTORY OF PRESENT ILLNESS: 66 year old remote smoker seen by Elsworth Soho in consultation in October 2022 for shortness of breath and hypoxia.  She had a hospital admission 05/2021 for hypoxic respiratory failure and treated for CHF exacerbation with a pericardial effusion dating back to 05/2020 without evidence of tamponade and echo 05/2021 showed severe pulm hypertension .  She was noted to desaturate on exertion but wanted to hold off on oxygen .  Serology showed positive ANA She was seen by rheumatology, treated with colchicine and Lasix without improvement in effusion, she finally underwent pericardial window in January 2023 by Dr. Kipp Brood . Pericardial biopsy showed changes of chronic inflammation without malignancy or granulomas.  AFB and fungal cultures were negative   She was admitted 3/8 with chest pain substernal with shortness of breath for 2 days.  Noted to be hypoxic to 80% on room air, required nonrebreather and then BiPAP CT chest did not show any evidence of pulmonary embolism but showed airspace disease in the right middle left lower lobe and right pleural effusion She was admitted with presumptive diagnosis of pneumonia and CHF.  Treated with ceftriaxone and azithromycin, IV Lasix Right thoracentesis was performed   3//9/23 with removal of 700 cc of fluid.  This showed LDH 288, protein 3.6   with 32 K white cells 78% neutrophils, fluid cultures negative  and cytology pending    Significant tests/ events reviewed  3/8 CT angiogram chest >> moderate right pleural effusion with patchy airspace disease in right middle and left lower lobe, mild atelectasis of right lower lobe, emphysema, right supraclavicular lymphadenopathy 2.3 x 1.4 cm  3/9 RT thoracentesis with removal of 700 cc of  dark yellow fluid/ exudate   Echo >> normal LVEF, RVSP 60, severe RV dysfunction    PAST MEDICAL HISTORY :   Pericardial window 11/2021 for large pericardial effusion Chronic hypoxic respiratory failure on oxygen Paroxysmal atrial fibrillation CKD stage III  Past Medical History:  Diagnosis Date   (HFpEF) heart failure with preserved ejection fraction (HCC)    CKD (chronic kidney disease)    Essential hypertension    Hyperlipidemia    PAF (paroxysmal atrial fibrillation) (HCC)    Pericardial effusion    Pneumonia    Type 2 diabetes mellitus ([place])       SUBJECTIVE:   VITAL SIGNS: Temp:  [97.8 F (36.6 C)-98.2 F (36.8 C)] 97.8 F (36.6 C) (03/13 0845) Pulse Rate:  [92-119] 97 (03/13 0930) Resp:  [14-31] 27 (03/13 0930) BP: (89-165)/(49-103) 133/96 (03/13 0930) SpO2:  [77 %-96 %] 77 % (03/13 0930) FiO2 (%):  [100 %] 100 % (03/13 0823) Weight:  [98.1 kg] 98.1 kg (03/13 0500)   Scheduled Meds:  apixaban  5 mg Oral BID   atorvastatin  20 mg Oral QPM   Chlorhexidine Gluconate Cloth  6 each Topical Daily   guaiFENesin-dextromethorphan  10 mL Oral Q8H   insulin aspart  0-5 Units Subcutaneous QHS   insulin aspart  0-6 Units Subcutaneous TID WC   levalbuterol  0.63 mg Nebulization TID   Continuous Infusions:  sodium chloride     sodium chloride 10 mL/hr at 01/16/22 2038   amiodarone 60 mg/hr (01/17/22 0912)   Followed by   amiodarone     azithromycin 500 mg (01/16/22 2140)  cefTRIAXone (ROCEPHIN)  IV 2 g (01/17/22 0551)   norepinephrine (LEVOPHED) Adult infusion Stopped (01/16/22 0909)   PRN Meds:.acetaminophen **OR** acetaminophen, ipratropium-albuterol, ondansetron **OR** ondansetron (ZOFRAN) IV, polyvinyl alcohol, promethazine, sodium chloride   CVP:  [16 mmHg-25 mmHg] 16 mmHg    Intake/Output Summary (Last 24 hours) at 01/17/2022 1255 Last data filed at 01/17/2022 1227 Gross per 24 hour  Intake 308.67 ml  Output 350 ml  Net -41.33 ml     PHYSICAL  EXAMINATION: Tmax  98.2  General appearance:    looks miserable on bipap and prefers it off   At Rest 02 sats  80% on bipap ST 12 with vt   No jvd Oropharynx clear,  mucosa nl Neck supple Lungs with a few scattered exp > insp rhonchi bilaterally and decreased bs bases  RRR no s3 or or sign murmur Abd obese with limited  excursion  Extr warm with 2+ pitting both Les Neuro  Sensorium alert/approp,  no apparent motor deficits    Recent Labs  Lab 01/15/22 0352 01/16/22 0356 01/17/22 0425  NA 137 136 136  K 4.3 4.3 4.2  CL 104 103 103  CO2 24 23 23   BUN 30* 37* 43*  CREATININE 1.36* 1.28* 1.44*  GLUCOSE 107* 135* 120*   Recent Labs  Lab 01/15/22 0352 01/16/22 0356 01/17/22 0425  HGB 14.0 13.9 14.0  HCT 45.5 43.6 44.0  WBC 8.8 8.0 6.7  PLT 243 275 284   DG CHEST PORT 1 VIEW  Result Date: 01/17/2022 CLINICAL DATA:  Shortness of breath.  Pneumonia. EXAM: PORTABLE CHEST 1 VIEW COMPARISON:  01/15/22 FINDINGS: Right arm PICC line tip is in the right atrium. Stable cardiomediastinal contours. Bilateral pleural effusions and mild interstitial edema is unchanged from previous exam. IMPRESSION: Stable CHF pattern. Electronically Signed   By: Kerby Moors M.D.   On: 01/17/2022 07:21   DG CHEST PORT 1 VIEW  Result Date: 01/15/2022 CLINICAL DATA:  Chest pain and shortness of breath. PICC line placement. EXAM: PORTABLE CHEST 1 VIEW COMPARISON:  Prior today FINDINGS: A new right arm PICC line is seen with tip overlying the superior cavoatrial junction. Cardiomegaly stable. Pulmonary vascular congestion is again seen. Infiltrate or atelectasis in the left lung base shows mild improvement since previous study. Small bilateral pleural effusions show no significant change. IMPRESSION: New right arm PICC line tip overlies the superior cavoatrial junction. Mild improvement in left basilar atelectasis versus infiltrate. Stable cardiomegaly and small bilateral pleural effusions. Electronically Signed    By: Marlaine Hind M.D.   On: 01/15/2022 15:59    ASSESSMENT / PLAN:  Acute hypoxic respiratory failure -? CAP with underlying severe pulmonary hypertension and acute on chronic  cor pulmonale -No residual pericardial effusion is noted , previously felt to be inflammatory -Continue high flow nasal cannula and dial down as hypoxia improves,    Acute cor pulmonale/severe pulm hypertension/ Atrial flutter  Echo 01/13/22 RV function much worse compared to TTE 10/11/21.   3. Left ventricular ejection fraction, by estimation, is 60 to 65%. The  left ventricle has normal function. The left ventricle has no regional  wall motion abnormalities. There is severe left ventricular hypertrophy.  Left ventricular diastolic parameters   are indeterminate.   4. Right ventricular systolic function is severely reduced. The right  ventricular size is severely enlarged. There is severely elevated  pulmonary artery systolic pressure.   5. Left atrial size was moderately dilated.   6. Right atrial size was severely dilated.  -  Not sure what is the etiology here, pericardial effusion is now resolved, she does not seem to have significant underlying lung disease, VQ scan was neg 05/2021  >> added aldactone pm 3/13      Community-acquired pneumonia Right parapneumonic effusion -fluid cultures are negative - Zmax/ CTx  3/8 >>> - urine legionella and strep ag 3/9 neg with PCT < 0.5 since admit  >>> check ESR   Right supraclavicular lymphadenopathy -this appears to be a new finding compared to previous CT -Would need evaluation once acute issues resolved with an FNA or follow-up imaging    Discussed with mother, daughter and pt:  she clearly has 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  The patient is critically ill with multiple organ systems failure and requires high complexity decision making for assessment and support, frequent evaluation and titration of therapies, application of advanced monitoring technologies and extensive interpretation of multiple databases. Critical Care Time devoted to patient care services described in this note is 45  minutes.    Christinia Gully, MD Pulmonary and [place] [facility] [phone]   After 7:00 pm call Elink  [phone]    Christinia Gully, MD Pulmonary and [place] [phone]   After 7:00 pm call Elink  [phone]

## 2022-01-17 NOTE — Progress Notes (Signed)
PT Cancellation Note ? ?Patient Details ?Name: Joeanna Howdyshell ?MRN: [id number] ?DOB: 1956-05-20 ? ? ?Cancelled Treatment:    Reason Eval/Treat Not Completed: Medical issues which prohibited therapy.  Physical therapy held per RN request due to patient having difficulty breathing. ? ? ?3:36 PM, 01/17/22 ?Ocie Bob, MPT ?Physical Therapist with [place] ?[facility] ?[phone] office ?[phone] mobile phone ? ?

## 2022-01-17 NOTE — Consult Note (Signed)
Progress Note  Patient Name: Tammy Small Date of Encounter: 01/17/2022  [facility] Cardiologist: Nona Dell, MD   Subjective   Nauseas and vomiting this morning HR in this setting elevated to 110-120s in Afib with RVR  Inpatient Medications    Scheduled Meds:  apixaban  5 mg Oral BID   atorvastatin  20 mg Oral QPM   Chlorhexidine Gluconate Cloth  6 each Topical Daily   furosemide  20 mg Intravenous Daily   guaiFENesin-dextromethorphan  10 mL Oral Q8H   insulin aspart  0-5 Units Subcutaneous QHS   insulin aspart  0-6 Units Subcutaneous TID WC   levalbuterol  0.63 mg Nebulization TID   metoprolol tartrate  25 mg Oral BID   midodrine  10 mg Oral TID WC   Continuous Infusions:  sodium chloride     sodium chloride 10 mL/hr at 01/16/22 2038   azithromycin 500 mg (01/16/22 2140)   cefTRIAXone (ROCEPHIN)  IV 2 g (01/17/22 0551)   norepinephrine (LEVOPHED) Adult infusion Stopped (01/16/22 0909)   PRN Meds: acetaminophen **OR** acetaminophen, ipratropium-albuterol, ondansetron **OR** ondansetron (ZOFRAN) IV, polyvinyl alcohol, promethazine, sodium chloride   Vital Signs    Vitals:   01/17/22 0530 01/17/22 0700 01/17/22 0800 01/17/22 0804  BP: 107/66 132/86 102/75 102/75  Pulse:   95 (!) 106  Resp: (!) 27 17 (!) 21   Temp:      TempSrc:      SpO2:   (!) 84% 90%  Weight:      Height:        Intake/Output Summary (Last 24 hours) at 01/17/2022 0834 Last data filed at 01/17/2022 0551 Gross per 24 hour  Intake 258.36 ml  Output 350 ml  Net -91.64 ml   Last 3 Weights 01/17/2022 01/14/2022 01/13/2022  Weight (lbs) 216 lb 4.3 oz 202 lb 13.2 oz 199 lb 11.8 oz  Weight (kg) 98.1 kg 92 kg 90.6 kg      Telemetry    Afib with episodes of RVR 110-120s - Personally Reviewed  ECG    No new ECG - Personally Reviewed  Physical Exam   GEN: Dyspneic, vomiting, uncomfortable appearing  Neck: No JVD Cardiac: Tachycardic, irregular Respiratory: Diffuse expiratory  wheezing and scattered rhonchi GI: Soft, nontender, non-distended  MS: Cool. Trace edema Neuro:  Nonfocal  Psych: Normal affect   Labs    High Sensitivity Troponin:   Recent Labs  Lab 02/01/2022 1549 01/22/2022 1803  TROPONINIHS 23* 22*     Chemistry Recent Labs  Lab 01/14/22 0424 01/15/22 0352 01/16/22 0356 01/17/22 0425  NA 138 137 136 136  K 4.4 4.3 4.3 4.2  CL 105 104 103 103  CO2 23 24 23 23   GLUCOSE 102* 107* 135* 120*  BUN 23 30* 37* 43*  CREATININE 1.35* 1.36* 1.28* 1.44*  CALCIUM 8.4* 8.9 8.9 8.8*  MG 1.8 1.8 1.8  --   PROT 6.1* 6.1* 6.2*  --   ALBUMIN 3.0* 2.9* 3.0*  --   AST 10* 15 21  --   ALT 8 10 15   --   ALKPHOS 68 72 96  --   BILITOT 2.0*   1.9* 1.4* 0.9  --   GFRNONAA 44* 43* 47* 41*  ANIONGAP 10 9 10 10     Lipids No results for input(s): CHOL, TRIG, HDL, LABVLDL, LDLCALC, CHOLHDL in the last 168 hours.  Hematology Recent Labs  Lab 01/15/22 0352 01/16/22 0356 01/17/22 0425  WBC 8.8 8.0 6.7  RBC 4.59 4.43  4.50  HGB 14.0 13.9 14.0  HCT 45.5 43.6 44.0  MCV 99.1 98.4 97.8  MCH 30.5 31.4 31.1  MCHC 30.8 31.9 31.8  RDW 14.7 14.6 14.5  PLT 243 275 284   Thyroid No results for input(s): TSH, FREET4 in the last 168 hours.  BNP Recent Labs  Lab 01/23/2022 1549 01/14/22 0424  BNP 963.0* 620.0*    DDimer No results for input(s): DDIMER in the last 168 hours.   Radiology    US Venous Img Lower Unilateral Right (DVT)  Result Date: 01/15/2022 CLINICAL DATA:  Right lower extremity edema. EXAM: RIGHT LOWER EXTREMITY VENOUS DOPPLER ULTRASOUND TECHNIQUE: Gray-scale sonography with graded compression, as well as color Doppler and duplex ultrasound were performed to evaluate the lower extremity deep venous systems from the level of the common femoral vein and including the common femoral, femoral, profunda femoral, popliteal and calf veins including the posterior tibial, peroneal and gastrocnemius veins when visible. The superficial great saphenous vein  was also interrogated. Spectral Doppler was utilized to evaluate flow at rest and with distal augmentation maneuvers in the common femoral, femoral and popliteal veins. COMPARISON:  None. FINDINGS: Contralateral Common Femoral Vein: Respiratory phasicity is normal and symmetric with the symptomatic side. No evidence of thrombus. Normal compressibility. Common Femoral Vein: No evidence of thrombus. Normal compressibility, respiratory phasicity and response to augmentation. Saphenofemoral Junction: No evidence of thrombus. Normal compressibility and flow on color Doppler imaging. Profunda Femoral Vein: No evidence of thrombus. Normal compressibility and flow on color Doppler imaging. Femoral Vein: No evidence of thrombus. Normal compressibility, respiratory phasicity and response to augmentation. Popliteal Vein: No evidence of thrombus. Normal compressibility, respiratory phasicity and response to augmentation. Calf Veins: No evidence of thrombus. Normal compressibility and flow on color Doppler imaging. Superficial Great Saphenous Vein: No evidence of thrombus. Normal compressibility. Venous Reflux:  None. Other Findings: No evidence of superficial thrombophlebitis or abnormal fluid collection. IMPRESSION: No evidence of lower extremity deep venous thrombosis. Electronically Signed   By: Irish Lack M.D.   On: 01/15/2022 09:43   DG CHEST PORT 1 VIEW  Result Date: 01/17/2022 CLINICAL DATA:  Shortness of breath.  Pneumonia. EXAM: PORTABLE CHEST 1 VIEW COMPARISON:  01/15/22 FINDINGS: Right arm PICC line tip is in the right atrium. Stable cardiomediastinal contours. Bilateral pleural effusions and mild interstitial edema is unchanged from previous exam. IMPRESSION: Stable CHF pattern. Electronically Signed   By: Signa Kell M.D.   On: 01/17/2022 07:21   DG CHEST PORT 1 VIEW  Result Date: 01/15/2022 CLINICAL DATA:  Chest pain and shortness of breath. PICC line placement. EXAM: PORTABLE CHEST 1 VIEW  COMPARISON:  Prior today FINDINGS: A new right arm PICC line is seen with tip overlying the superior cavoatrial junction. Cardiomegaly stable. Pulmonary vascular congestion is again seen. Infiltrate or atelectasis in the left lung base shows mild improvement since previous study. Small bilateral pleural effusions show no significant change. IMPRESSION: New right arm PICC line tip overlies the superior cavoatrial junction. Mild improvement in left basilar atelectasis versus infiltrate. Stable cardiomegaly and small bilateral pleural effusions. Electronically Signed   By: Danae Orleans M.D.   On: 01/15/2022 15:59   [place] EKG SITE RITE  Result Date: 01/15/2022 If Site Rite image not attached, placement could not be confirmed due to current cardiac rhythm.   Cardiac Studies   TTE 01/13/22: IMPRESSIONS   1. Pericardial effuson gone since window performed.   2. RV function much worse compared to TTE 10/11/21.  3. Left ventricular ejection fraction, by estimation, is 60 to 65%. The  left ventricle has normal function. The left ventricle has no regional  wall motion abnormalities. There is severe left ventricular hypertrophy.  Left ventricular diastolic parameters   are indeterminate.   4. Right ventricular systolic function is severely reduced. The right  ventricular size is severely enlarged. There is severely elevated  pulmonary artery systolic pressure.   5. Left atrial size was moderately dilated.   6. Right atrial size was severely dilated.   7. The mitral valve is abnormal. No evidence of mitral valve  regurgitation. No evidence of mitral stenosis.   8. The aortic valve is tricuspid. There is mild calcification of the  aortic valve. Aortic valve regurgitation is not visualized. Aortic valve  sclerosis is present, with no evidence of aortic valve stenosis.   9. The inferior vena cava is normal in size with greater than 50%  respiratory variability, suggesting right atrial pressure of 3 mmHg.    Patient Profile     66 y.o. female with a history of paroxysmal atrial fibrillation, hypertension, type 2 diabetes mellitus, HFpEF, pulmonary hypertension with chronic hypoxic respiratory failure, and pericardial effusion status post pericardial window who presented with acute on chronic hypoxic respiratory failure found to have right lower lobe pneumonia.  Assessment & Plan    #Acute on chronic hypoxic respiratory failure: #RLL Pneumonia Patient presented with acute on chronic hypoxic respiratory failure and a moderate sized pleural effusion found to have pneumonia with parapneumonic effusion. CTA without evidence of PE. TTE with severe pulmonary HTN with severe RV dysfunction. Suspect the initial primary driver of her hypoxic was underlying pneumonia in the setting of poor substrate with suspected COPD and known severe pulmonary HTN. Currently, she is cool on exam and I am concerned that her hypoxia has caused worsening of pulmonary arterial vasoconstriction leading to poor forward flow to the LV. Will check CVP and co-ox. May ultimately require milrinone both for pulmonary vasodilation and RV inotropic support. Given her degree of RV dysfunction, I am concerned that if the patient were to be intubated, she may not be able to be weaned off or would not tolerate anesthesia for intubation.  She is too unstable from a respiratory standpoint to transfer to [facility] at this time. -Will check co-ox and CVP off PICC line -Low threshold to start milrinone for both RV inotropic support and pulmonary vasodilation -Continue supplemental O2 and BiPAP -Would try to avoid intubation if possible as I am afraid she would code during anesthesia or have difficulty being weaned from the vent given the degree of RV dysfunction -Stop metop and change to amiodarone for HR control -ABX per primary team -If becomes more clinically compensated, can transfer to [facility] for [facility]; she is too unstable from a respiratory standpoint  to transfer at this time  #Severe Pulmonary HTN: #Severe RV Failure: Likely multifactorial group 2 and 3 in the setting of diastolic HF, COPD and suspected OSA. Suspect the primary driver of acute hypoxia on admission due to underlying pneumonia in the setting of underlying COPD and pulmonary HTN. Now with persistent hypoxia and cool on exam with concern that she has worsening pulmonary vasoconstriction as detailed above. Possibly in low flow state due to worsening pulmonary HTN and underfilling LV. She is notably cool on exam this AM. Will hold diuresis and check CVP and co-ox off PICC line. -Will check co-ox and CVP off PICC line -Low threshold to start milrinone for both RV  inotropic support and pulmonary vasodilation -Continue supplemental O2 and BiPAP -Would try to avoid intubation if possible as I am afraid she would code during anesthesia or have difficulty being weaned from the vent given the degree of RV dysfunction  #Chronic Diastolic HF: #Severe LVH: LVEF 60-65%, severe LVH. Filling pressures not significantly elevated on TTE 03/09 which suggests that primary driver of hypoxia was likely pneumonia rather than acute HF exacerbation.  -Check co-ox and CVP as above  #PAFib: CHADs-vasc 3-4. On apixaban for AC and metop for rate control. -Stop metop given concern for low flow state -Start amiodarone bolus +gtt for HR control -Continue apixaban 5mg  BID  #Hypotension: Weaned off pressors.  -Hold midodrine pending co-ox as above  #History of large pericardial window: status post robot-assisted pericardial window by Dr. Cliffton AstersLightfoot in January.  No significant residual collection at this point.    CRITICAL CARE TIME: I have spent a total of 45 minutes with patient reviewing hospital notes, telemetry, EKGs, labs and examining the patient as well as establishing an assessment and plan that was discussed with the patient.  > 50% of time was spent in direct patient care. The patient is  critically ill with multi-organ system failure and requires high complexity decision making for assessment and support, frequent evaluation and titration of therapies, application of advanced monitoring technologies and extensive interpretation of multiple databases.      For questions or updates, please contact [facility] Please consult [email or web address] for contact info under        Signed, Meriam SpragueHeather E Rayven Rettig, MD  01/17/2022, 8:34 AM

## 2022-01-17 NOTE — Progress Notes (Signed)
Patient started to vomit (clear/white ,thick mucoidal secretions) while on BiPAP. BiPAP removed due to contraindication and she was placed on 15L HFNC and NRB . ?

## 2022-01-17 NOTE — Progress Notes (Signed)
Pt requesting something to help her relax and help decrease her work of breathing. Pt sat's were also becoming very low in the high 50's percentile. Discussed with MD and Morphine ordered to help patient's work of breathing. Pt also stated she would put back on the BiPAP so she could make it to her virtual family meeting at 6pm today. Also encouraged patient to lay on her side if she wanted to lay down as this helps to expand the lungs. Pt currently lying on right side.  ? ?Will continue to monitor and update MD and RT as necessary.  ?

## 2022-01-17 NOTE — Progress Notes (Signed)
Pt has been on multiple types of oxygen delivery systems this morning including heated high flow, bi-pap, and 15L HFNC and Non-re breather. All have not brought patients oxygen saturations above 77-79%. Pt expressed to attending RN that "she is tired and just wants to give up." Pt made a DNR by pulmonologist. Explained to patient that we still need to keep her comfortable and provide oxygen therapy to her. Asked patient out of all the modes provided today, which one was the most comfortable to her and she said the 15L HFNC and the non re breather, so that is what was applied to patient.  ? ?MD and RT made aware, will continue to monitor.  ?

## 2022-01-17 NOTE — Progress Notes (Signed)
RN called due to patient being hypoxic with O2 sats in the high 70s and low 80s despite being on HFNC at 100%, she was transitioned to BiPAP and the O2 sat was still in the mid 70s and low 80s.  However, patient was alert and oriented x3, she was able to communicate make decisions for self, she was able to make phone call with family member without requiring any assistance. ?Chest x-ray and ABG were ordered and pending ?Patient agreed to be intubated if she gets to a state where she could not breathe on her own and was unable to make decision at that time.  This was witnessed by RN Shanda Bumps). ? ?Total time:  10 minutes ?This includes time reviewing the chart including progress notes, labs, EKGs, taking medical decisions, ordering labs and documenting findings. ? ?

## 2022-01-18 DIAGNOSIS — I5033 Acute on chronic diastolic (congestive) heart failure: Secondary | ICD-10-CM | POA: Diagnosis present

## 2022-01-18 DIAGNOSIS — Z515 Encounter for palliative care: Secondary | ICD-10-CM

## 2022-01-18 DIAGNOSIS — I272 Pulmonary hypertension, unspecified: Secondary | ICD-10-CM | POA: Diagnosis present

## 2022-01-18 LAB — GLUCOSE, CAPILLARY
Glucose-Capillary: 143 mg/dL — ABNORMAL HIGH (ref 70–99)
Glucose-Capillary: 131 mg/dL — ABNORMAL HIGH (ref 70–99)

## 2022-01-18 MED ORDER — POLYVINYL ALCOHOL 1.4 % OP SOLN: 1.0000 [drp] | Freq: Four times a day (QID) | OPHTHALMIC | Status: DC | PRN

## 2022-01-18 MED ORDER — GLYCOPYRROLATE 0.2 MG/ML IJ SOLN: 0.2000 mg | INTRAMUSCULAR | Status: DC | PRN

## 2022-01-18 MED ORDER — MORPHINE 100MG IN NS 100ML (1MG/ML) PREMIX INFUSION
1.0000 mg/h | INTRAVENOUS | Status: DC
Start: 2022-01-18 — End: 2022-01-19
  Administered 2022-01-18: 1 mg/h via INTRAVENOUS
  Filled 2022-01-18: qty 100

## 2022-01-18 MED ORDER — BIOTENE DRY MOUTH MT LIQD: 15.0000 mL | OROMUCOSAL | Status: DC | PRN

## 2022-01-18 MED ORDER — GLYCOPYRROLATE 1 MG PO TABS: 1.0000 mg | ORAL_TABLET | ORAL | Status: DC | PRN

## 2022-01-18 MED ORDER — LEVALBUTEROL HCL 0.63 MG/3ML IN NEBU: 0.6300 mg | INHALATION_SOLUTION | Freq: Three times a day (TID) | RESPIRATORY_TRACT | Status: DC | PRN

## 2022-01-18 MED ORDER — SCOPOLAMINE 1 MG/3DAYS TD PT72
1.0000 | MEDICATED_PATCH | TRANSDERMAL | Status: DC
  Filled 2022-01-18 (×2): qty 1

## 2022-01-18 MED ORDER — LORAZEPAM 2 MG/ML IJ SOLN
0.5000 mg | INTRAMUSCULAR | Status: DC | PRN
Start: 2022-01-18 — End: 2022-01-19

## 2022-01-18 MED ORDER — MORPHINE SULFATE (PF) 2 MG/ML IV SOLN: 2.0000 mg | INTRAVENOUS | Status: DC | PRN

## 2022-01-18 NOTE — Progress Notes (Signed)
PT Cancellation Note ? ?Patient Details ?Name: Tammy Small ?MRN: [id number] ?DOB: 1956/07/23 ? ? ?Cancelled Treatment:    Reason Eval/Treat Not Completed: Medical issues which prohibited therapy.  Patient transitioned to comfort care only and Physical therapy is signed off. ? ? ?12:34 PM, 01/18/22 ?Ocie Bob, MPT ?Physical Therapist with [place] ?[facility] ?[phone] office ?[phone] mobile phone ? ?

## 2022-01-18 NOTE — Progress Notes (Addendum)
66 yo with hx HFpEF, pericardial effusion now s/p pericardial window 11/2021, atrial fibrillation, HTN, T2DM who presented with chest pain and shortness of breath, found to have right sided pleural effusion with patchy airspace opacities.  Also concern for heart failure exacerbation.  She's been admitted for acute hypoxic respiratory failure, being treated for pneumonia with parapneumonic effusion  and acute on chronic diastolic CHF exacerbation with significant cor pulmonale in the setting of persistent severe pulmonary hypertension ? ?Problem  ?End of Life Care  ?Palliative Care Patient  ?Severe Pulmonary Hypertension (Hcc)  ?Acute Respiratory Failure With Hypoxia (Hcc)  ?Pneumonia  ?Acute on chronic diastolic CHF (congestive heart failure) with Cor Pulmonale  ?Acute Cor Pulmonale (Hcc)  ?Parapneumonic Effusion  ? ? ?-01/18/22 ?-- Conference at bedside with patient, patient's mother, patient's daughter and other family members ?--Patient and family request transition to full comfort care ?- ?Palliative/comfort care orders/initiated ? ?-- Patient appears comfortable after morphine sulfate IV ? ?-- Patient remains DNR/DNI with full comfort care ?- ?Overall prognosis is grave ?-Anticipate in-hospital death ?- ?Total care time 43 minutes ?- ?Please see full progress note dated 01/17/2022 for further details of patient's hospital stay ? ?Shon Hale, MD ? ?

## 2022-01-18 NOTE — Progress Notes (Signed)
Patient transitioned to comfort measures. Cardiology will sign-off. ? ?Mecca Guitron, MD ?

## 2022-01-19 DIAGNOSIS — N179 Acute kidney failure, unspecified: Secondary | ICD-10-CM

## 2022-01-19 DIAGNOSIS — Z515 Encounter for palliative care: Secondary | ICD-10-CM

## 2022-01-19 DIAGNOSIS — I50813 Acute on chronic right heart failure: Secondary | ICD-10-CM

## 2022-01-20 LAB — CULTURE, BODY FLUID W GRAM STAIN -BOTTLE
Culture: NO GROWTH
Special Requests: ADEQUATE

## 2022-01-25 LAB — MISCELLANEOUS TEST

## 2022-01-31 ENCOUNTER — Ambulatory Visit: Payer: Medicaid Other | Admitting: Physician Assistant

## 2022-02-01 ENCOUNTER — Other Ambulatory Visit (HOSPITAL_COMMUNITY): Payer: Medicaid Other

## 2022-02-05 NOTE — Death Summary Note (Signed)
?DEATH SUMMARY  ? ?Patient Details  ?Name: Tammy Small ?MRN: [id number] ?DOB: Jun 25, 1956 ? ?Admission/Discharge Information  ? ?Admit Date:  January 28, 2022  ?Date of Death: Date of Death: 02/04/22  ?Time of Death: Time of Death: 0228  ?Length of Stay: 7  ?Referring Physician: Jacquelin Hawking, PA-C  ? ?Reason(s) for Hospitalization  ? 66 y.o. female with medical history significant for congestive heart failure, hypertension, atrial fibrillation, pericardial effusion with recent robotic assisted pericardial window procedure.  Patient presented to the Ed with c/o chest pain and difficulty breathing that started yesterday. At the time of my evaluation, patient is on BIPAP, history is limited from patient.  She reports pressure-like central chest pain along with difficulty breathing. Cough also. No fever. She denies lower extremity swelling. And she has been complaint with lasix 40mg  daily and Eliquis 2ce a day. ?  ?Recent hospitalization 1/11 to 1/17-patient underwent Xi robotic assisted right thoracoscopy, pericardial window 11/17/2021.  Stay was complicated by AKI felt secondary to Toradol, and moderate drainage from pericardial tube for several days after surgery, hypoglycemia.  These improved prior to discharge. ?  ?ED Course: O2 sats down to 80% on room air, she was placed on 15 L nonrebreather, O2 sats dropped to 87 percent, patient with increased work of breathing she was subsequently placed on BiPAP with significant improvement in tachypnea and dyspnea.  Afebrile.  Heart rate 80s to 90s.  Respiratory rate 20-47.  Blood pressure systolic 119- 157.  COVID test negative.  CTA chest-no PE, but shows moderate size right pleural effusion with associated subtotal collapse of the right lower lobe.  Patchy airspace opacities in right middle and left lower lobe.  Also findings suspicious for right heart failure.  IV Lasix 80 mg given.  IV vancomycin and cefepime started. ?  ?Hospital Course documented by Dr. 01/15/2022 ? ?66 yo  with hx HFpEF, pericardial effusion now s/p pericardial window 11/2021, atrial fibrillation, HTN, T2DM who presented with chest pain and shortness of breath, found to have right sided pleural effusion with patchy airspace opacities.  Also concern for heart failure exacerbation.  She's been admitted for acute hypoxic respiratory failure, being treated for pneumonia with parapneumonic effusion  and acute on chronic diastolic CHF exacerbation with significant cor pulmonale in the setting of persistent severe pulmonary hypertension ?  ?-01/18/22 ?-- Conference at bedside with patient, patient's mother, patient's daughter and other family members ?--Patient and family request transition to full comfort care ?- ?Palliative/comfort care orders/initiated ?  ?-- Patient appears comfortable after morphine sulfate IV ?  ?-- Patient remains DNR/DNI with full comfort care ?- ?Overall prognosis is grave ?-Anticipate in-hospital death ? ?Pt expired on 2022-02-04 at 0228 under full comfort orders ? ?Diagnoses  ?Preliminary cause of death:  ?Pneumonia with para pneumonic effusion ?Acute on chronic diastolic CHF exacerbation with significant cor pulmonale in the setting of persistent severe pulmonary hypertension ?Secondary Diagnoses (including complications and co-morbidities):  ?Principal Problem: ?  End of life care ?Active Problems: ?  Paroxysmal A-fib (HCC) ?  Failure to thrive  ?  HTN (hypertension) ?  Acute exacerbation of CHF (congestive heart failure) (HCC) ?  S/P Xi Robotic assisted Video Assisted Left Pericardial Window Creation ?  Acute respiratory failure with hypoxia (HCC) ?  Supraclavicular lymphadenopathy ?  Pneumonia ?  Parapneumonic Effusion ?  Type 2 diabetes mellitus (HCC) ?  Nausea & vomiting ?  Hypotension ?  AKI (acute kidney injury) (HCC) ?  Total bilirubin, elevated ?  Acute cor pulmonale (  HCC) ?  Edema of right lower extremity ?  HCAP (healthcare-associated pneumonia) ?  Pleural effusion ?  Palliative care  patient ?  Severe pulmonary hypertension (HCC) ?  Acute on chronic diastolic CHF (congestive heart failure) with Cor Pulmonale ? ? ?Brief Hospital Course (including significant findings, care, treatment, and services provided and events leading to death)  ?66 y.o. female with medical history significant for congestive heart failure, hypertension, atrial fibrillation, pericardial effusion with recent robotic assisted pericardial window procedure.  Patient presented to the Ed with c/o chest pain and difficulty breathing that started yesterday. At the time of my evaluation, patient is on BIPAP, history is limited from patient.  She reports pressure-like central chest pain along with difficulty breathing. Cough also. No fever. She denies lower extremity swelling. And she has been complaint with lasix 40mg  daily and Eliquis 2ce a day. ?  ?Recent hospitalization 1/11 to 1/17-patient underwent Xi robotic assisted right thoracoscopy, pericardial window 11/17/2021.  Stay was complicated by AKI felt secondary to Toradol, and moderate drainage from pericardial tube for several days after surgery, hypoglycemia.  These improved prior to discharge. ?  ?ED Course: O2 sats down to 80% on room air, she was placed on 15 L nonrebreather, O2 sats dropped to 87 percent, patient with increased work of breathing she was subsequently placed on BiPAP with significant improvement in tachypnea and dyspnea.  Afebrile.  Heart rate 80s to 90s.  Respiratory rate 20-47.  Blood pressure systolic 119- 157.  COVID test negative.  CTA chest-no PE, but shows moderate size right pleural effusion with associated subtotal collapse of the right lower lobe.  Patchy airspace opacities in right middle and left lower lobe.  Also findings suspicious for right heart failure.  IV Lasix 80 mg given.  IV vancomycin and cefepime started. ? ? ?Pertinent Labs and Studies  ?Significant Diagnostic Studies ?DG Chest 1 View ? ?Result Date: 01/13/2022 ?CLINICAL DATA:  Post  RIGHT thoracentesis EXAM: CHEST  1 VIEW COMPARISON:  01/20/2022 FINDINGS: Enlargement of cardiac silhouette. Mild atelectasis versus infiltrate at LEFT base. Significantly decreased RIGHT pleural effusion post thoracentesis. No pneumothorax. Atherosclerotic calcification aorta. IMPRESSION: No pneumothorax following RIGHT thoracentesis. Enlargement of cardiac silhouette with mild persistent atelectasis or infiltrate at LEFT base. Electronically Signed   By: Ulyses SouthwardMark  Boles M.D.   On: 01/13/2022 12:44  ? ?CT Angio Chest PE W/Cm &/Or Wo Cm ? ?Addendum Date: 02/04/2022   ?ADDENDUM REPORT: 01/11/2022 19:24 ADDENDUM: Although reported in the findings section, an additional finding of potential significance was not included in the impression. There is possible right supraclavicular lymphadenopathy. Consider further evaluation with neck CT, ideally after the patient's acute shortness of breath has resolved. Electronically Signed   By: Carey BullocksWilliam  Veazey M.D.   On: 01/29/2022 19:24  ? ?Result Date: 02/02/2022 ?CLINICAL DATA:  Shortness of breath with chest pain and pressure since yesterday. Clinical concern for pulmonary embolism. EXAM: CT ANGIOGRAPHY CHEST WITH CONTRAST TECHNIQUE: Multidetector CT imaging of the chest was performed using the standard protocol during bolus administration of intravenous contrast. Multiplanar CT image reconstructions and MIPs were obtained to evaluate the vascular anatomy. RADIATION DOSE REDUCTION: This exam was performed according to the departmental dose-optimization program which includes automated exposure control, adjustment of the mA and/or kV according to patient size and/or use of iterative reconstruction technique. CONTRAST:  75mL OMNIPAQUE IOHEXOL 350 MG/ML SOLN COMPARISON:  Radiographs today and 11/22/2021. CT without contrast 11/02/2021. FINDINGS: Cardiovascular: The pulmonary arteries are well opacified with contrast to the  level of the subsegmental branches. There is mildly heterogeneous  enhancement within the lower lobe subsegmental branches, although no discrete intravascular filling defects are seen to suggest acute pulmonary embolism. There is atherosclerosis of the aorta, great vessels and

## 2022-02-05 NOTE — Progress Notes (Signed)
60 mL of Morphine wasted with Pasty Arch, RN  ?

## 2022-02-05 DEATH — deceased

## 2022-03-07 ENCOUNTER — Ambulatory Visit: Payer: Medicaid Other | Admitting: Pulmonary Disease

## 2022-03-14 ENCOUNTER — Ambulatory Visit: Payer: Medicaid Other | Admitting: Cardiology

## 2022-07-21 IMAGING — DX DG CHEST 1V PORT
1 series · 1 of 1 positions shown · non-contrast
Comparison: 01/15/22

CLINICAL DATA: Shortness of breath.  Pneumonia.

EXAM:
PORTABLE CHEST 1 VIEW

[chest ap]
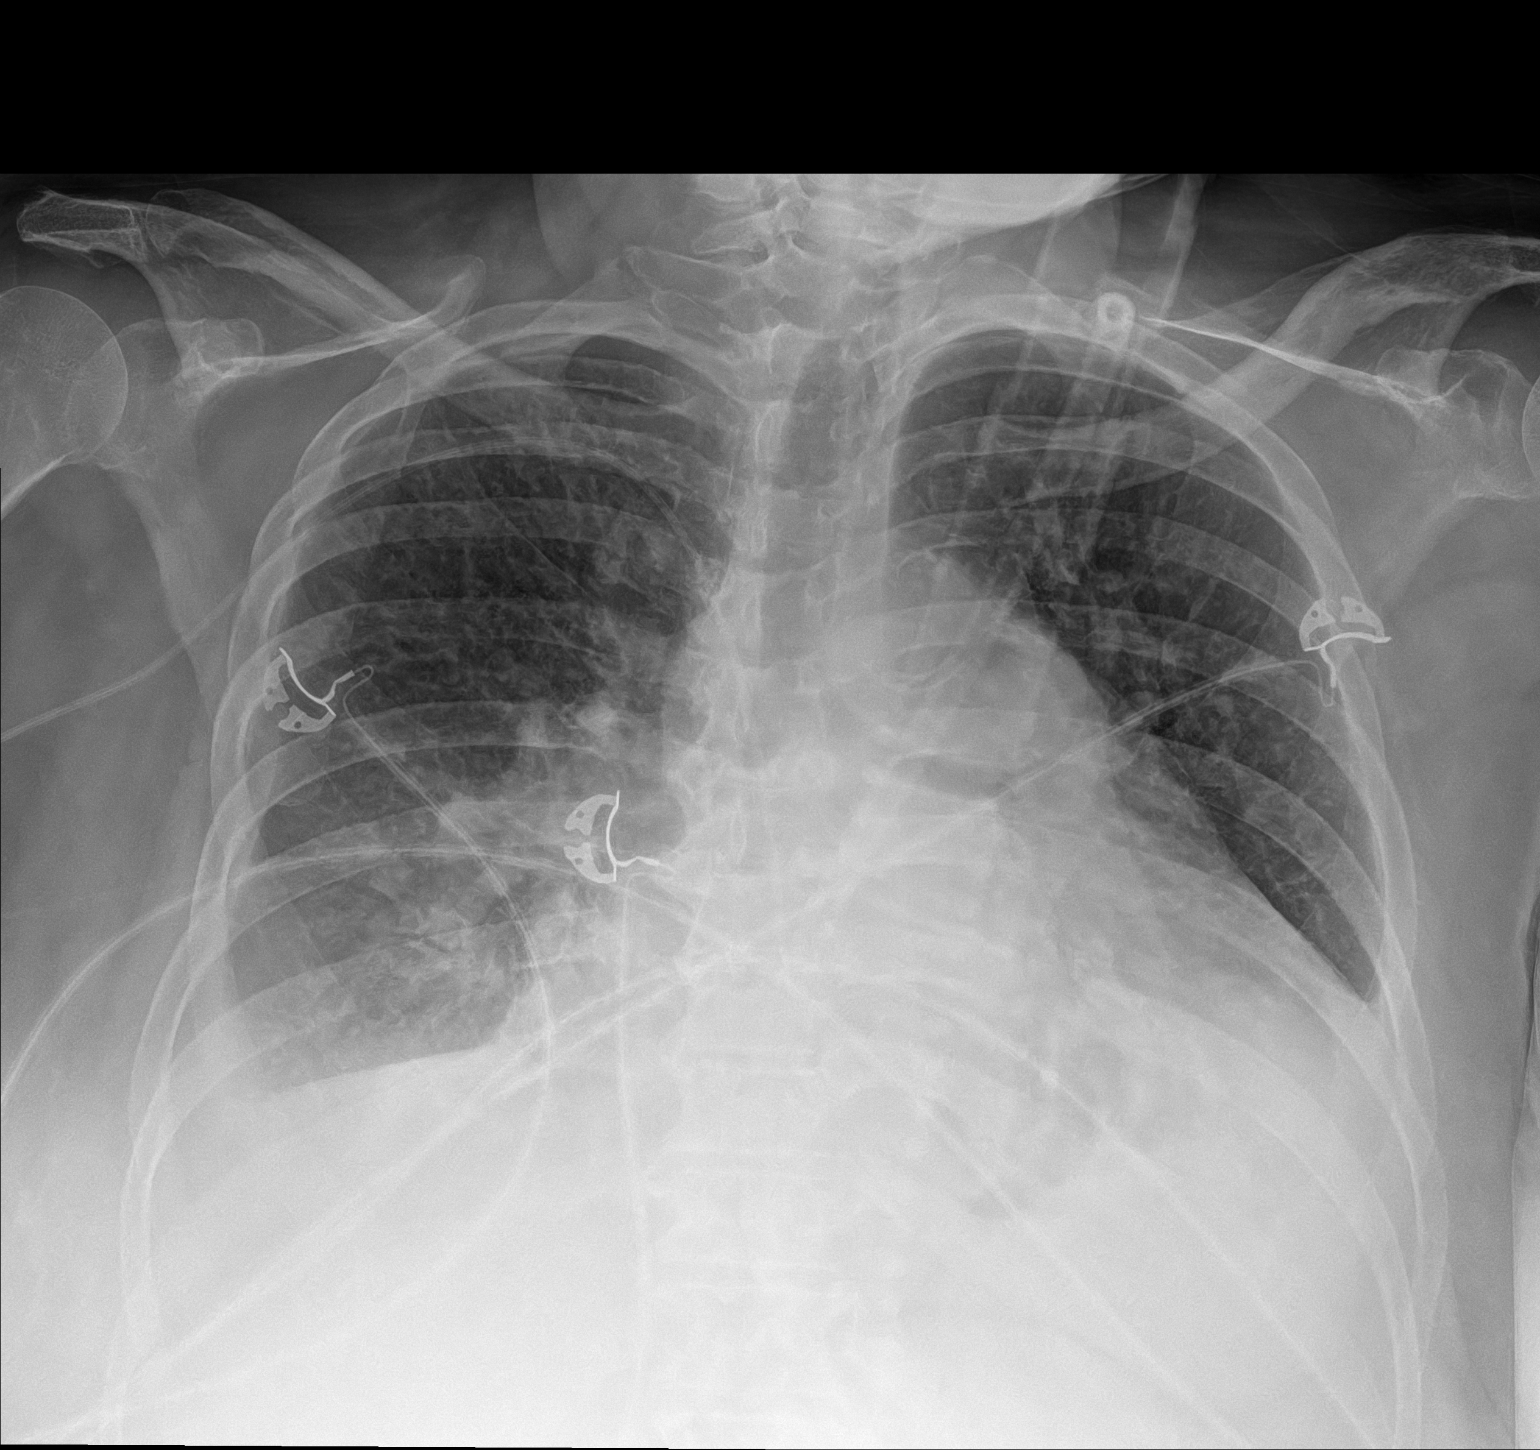

[1 of 1 positions shown; findings below may reference images not displayed]

FINDINGS: Right arm PICC line tip is in the right atrium. Stable
cardiomediastinal contours. Bilateral pleural effusions and mild
interstitial edema is unchanged from previous exam.
IMPRESSION: Stable CHF pattern.
